# Patient Record
Sex: Male | Born: 1960 | Race: White | Hispanic: No | Marital: Single | Smoking: Never smoker
Health system: Southern US, Community
[De-identification: ages and names within clinical notes are randomized; demographics above are authoritative.]

## PROBLEM LIST (undated history)

## (undated) DIAGNOSIS — M479 Spondylosis, unspecified: Secondary | ICD-10-CM

## (undated) DIAGNOSIS — G4730 Sleep apnea, unspecified: Secondary | ICD-10-CM

## (undated) DIAGNOSIS — K439 Ventral hernia without obstruction or gangrene: Secondary | ICD-10-CM

## (undated) DIAGNOSIS — G4733 Obstructive sleep apnea (adult) (pediatric): Secondary | ICD-10-CM

## (undated) DIAGNOSIS — K429 Umbilical hernia without obstruction or gangrene: Secondary | ICD-10-CM

## (undated) DIAGNOSIS — M51369 Other intervertebral disc degeneration, lumbar region without mention of lumbar back pain or lower extremity pain: Secondary | ICD-10-CM

## (undated) DIAGNOSIS — I209 Angina pectoris, unspecified: Secondary | ICD-10-CM

## (undated) DIAGNOSIS — I700 Atherosclerosis of aorta: Secondary | ICD-10-CM

## (undated) DIAGNOSIS — M24159 Other articular cartilage disorders, unspecified hip: Secondary | ICD-10-CM

## (undated) DIAGNOSIS — I351 Nonrheumatic aortic (valve) insufficiency: Secondary | ICD-10-CM

## (undated) DIAGNOSIS — K760 Fatty (change of) liver, not elsewhere classified: Secondary | ICD-10-CM

## (undated) DIAGNOSIS — R51 Headache: Secondary | ICD-10-CM

## (undated) DIAGNOSIS — R519 Headache, unspecified: Secondary | ICD-10-CM

## (undated) DIAGNOSIS — N200 Calculus of kidney: Secondary | ICD-10-CM

## (undated) DIAGNOSIS — I779 Disorder of arteries and arterioles, unspecified: Secondary | ICD-10-CM

## (undated) DIAGNOSIS — R7302 Impaired glucose tolerance (oral): Secondary | ICD-10-CM

## (undated) DIAGNOSIS — I2510 Atherosclerotic heart disease of native coronary artery without angina pectoris: Secondary | ICD-10-CM

## (undated) DIAGNOSIS — N281 Cyst of kidney, acquired: Secondary | ICD-10-CM

## (undated) DIAGNOSIS — M5416 Radiculopathy, lumbar region: Secondary | ICD-10-CM

## (undated) DIAGNOSIS — B029 Zoster without complications: Secondary | ICD-10-CM

## (undated) DIAGNOSIS — K219 Gastro-esophageal reflux disease without esophagitis: Secondary | ICD-10-CM

## (undated) DIAGNOSIS — E669 Obesity, unspecified: Secondary | ICD-10-CM

## (undated) DIAGNOSIS — J189 Pneumonia, unspecified organism: Secondary | ICD-10-CM

## (undated) DIAGNOSIS — G44229 Chronic tension-type headache, not intractable: Secondary | ICD-10-CM

## (undated) DIAGNOSIS — M5136 Other intervertebral disc degeneration, lumbar region: Secondary | ICD-10-CM

## (undated) DIAGNOSIS — E785 Hyperlipidemia, unspecified: Secondary | ICD-10-CM

## (undated) DIAGNOSIS — K5790 Diverticulosis of intestine, part unspecified, without perforation or abscess without bleeding: Secondary | ICD-10-CM

## (undated) DIAGNOSIS — N138 Other obstructive and reflux uropathy: Secondary | ICD-10-CM

## (undated) DIAGNOSIS — K259 Gastric ulcer, unspecified as acute or chronic, without hemorrhage or perforation: Secondary | ICD-10-CM

## (undated) DIAGNOSIS — Z87442 Personal history of urinary calculi: Secondary | ICD-10-CM

## (undated) DIAGNOSIS — M169 Osteoarthritis of hip, unspecified: Secondary | ICD-10-CM

## (undated) DIAGNOSIS — I10 Essential (primary) hypertension: Secondary | ICD-10-CM

## (undated) HISTORY — PX: KNEE SURGERY: SHX244

## (undated) HISTORY — PX: NASAL SEPTOPLASTY W/ TURBINOPLASTY: SHX2070

## (undated) HISTORY — PX: TONSILLECTOMY: SUR1361

## (undated) HISTORY — PX: OTHER SURGICAL HISTORY: SHX169

## (undated) HISTORY — DX: Hyperlipidemia, unspecified: E78.5

## (undated) HISTORY — DX: Sleep apnea, unspecified: G47.30

---

## 1998-03-02 DIAGNOSIS — Z9889 Other specified postprocedural states: Secondary | ICD-10-CM

## 1998-03-02 HISTORY — DX: Other specified postprocedural states: Z98.890

## 1998-03-02 HISTORY — PX: UVULOPALATOPHARYNGOPLASTY (UPPP)/TONSILLECTOMY/SEPTOPLASTY: SHX6164

## 2006-03-02 HISTORY — PX: UPPER GI ENDOSCOPY: SHX6162

## 2008-11-16 ENCOUNTER — Ambulatory Visit: Payer: Self-pay | Admitting: Family Medicine

## 2009-09-11 ENCOUNTER — Ambulatory Visit: Payer: Self-pay | Admitting: Internal Medicine

## 2009-09-13 ENCOUNTER — Ambulatory Visit: Payer: Self-pay | Admitting: Gastroenterology

## 2009-09-19 LAB — PATHOLOGY REPORT

## 2010-05-29 ENCOUNTER — Ambulatory Visit: Payer: Self-pay | Admitting: Gastroenterology

## 2010-07-01 ENCOUNTER — Ambulatory Visit: Payer: Self-pay | Admitting: Surgery

## 2010-07-25 ENCOUNTER — Ambulatory Visit: Payer: Self-pay | Admitting: Internal Medicine

## 2010-09-29 ENCOUNTER — Ambulatory Visit: Payer: Self-pay | Admitting: Family Medicine

## 2010-12-15 ENCOUNTER — Ambulatory Visit: Payer: Self-pay

## 2011-08-04 ENCOUNTER — Emergency Department: Payer: Self-pay | Admitting: *Deleted

## 2011-09-18 ENCOUNTER — Ambulatory Visit: Payer: Self-pay | Admitting: Internal Medicine

## 2012-07-04 ENCOUNTER — Ambulatory Visit: Payer: Self-pay | Admitting: Gastroenterology

## 2012-12-18 ENCOUNTER — Ambulatory Visit: Payer: Self-pay | Admitting: Physician Assistant

## 2012-12-21 ENCOUNTER — Ambulatory Visit: Payer: Self-pay | Admitting: Physician Assistant

## 2013-07-31 DIAGNOSIS — R109 Unspecified abdominal pain: Secondary | ICD-10-CM | POA: Insufficient documentation

## 2013-08-03 DIAGNOSIS — E785 Hyperlipidemia, unspecified: Secondary | ICD-10-CM | POA: Insufficient documentation

## 2013-08-03 DIAGNOSIS — M545 Low back pain, unspecified: Secondary | ICD-10-CM | POA: Insufficient documentation

## 2013-08-03 DIAGNOSIS — K219 Gastro-esophageal reflux disease without esophagitis: Secondary | ICD-10-CM | POA: Insufficient documentation

## 2013-08-03 DIAGNOSIS — I10 Essential (primary) hypertension: Secondary | ICD-10-CM | POA: Insufficient documentation

## 2013-08-03 DIAGNOSIS — R7301 Impaired fasting glucose: Secondary | ICD-10-CM | POA: Insufficient documentation

## 2013-08-07 ENCOUNTER — Ambulatory Visit: Payer: Self-pay | Admitting: Gastroenterology

## 2013-09-07 DIAGNOSIS — M5416 Radiculopathy, lumbar region: Secondary | ICD-10-CM | POA: Insufficient documentation

## 2013-09-07 DIAGNOSIS — M51369 Other intervertebral disc degeneration, lumbar region without mention of lumbar back pain or lower extremity pain: Secondary | ICD-10-CM | POA: Insufficient documentation

## 2013-09-07 DIAGNOSIS — M5136 Other intervertebral disc degeneration, lumbar region: Secondary | ICD-10-CM | POA: Insufficient documentation

## 2013-09-07 DIAGNOSIS — M24159 Other articular cartilage disorders, unspecified hip: Secondary | ICD-10-CM | POA: Insufficient documentation

## 2013-09-07 DIAGNOSIS — M169 Osteoarthritis of hip, unspecified: Secondary | ICD-10-CM

## 2013-09-19 ENCOUNTER — Ambulatory Visit: Payer: Self-pay | Admitting: Physical Medicine and Rehabilitation

## 2013-09-20 DIAGNOSIS — M25551 Pain in right hip: Secondary | ICD-10-CM | POA: Insufficient documentation

## 2013-09-23 ENCOUNTER — Ambulatory Visit: Payer: Self-pay | Admitting: Internal Medicine

## 2013-10-17 ENCOUNTER — Ambulatory Visit: Payer: Self-pay | Admitting: Physician Assistant

## 2013-10-19 DIAGNOSIS — B029 Zoster without complications: Secondary | ICD-10-CM | POA: Insufficient documentation

## 2014-01-09 DIAGNOSIS — R7302 Impaired glucose tolerance (oral): Secondary | ICD-10-CM | POA: Insufficient documentation

## 2014-11-15 ENCOUNTER — Encounter: Payer: Self-pay | Admitting: Emergency Medicine

## 2014-11-15 ENCOUNTER — Ambulatory Visit
Admission: EM | Admit: 2014-11-15 | Discharge: 2014-11-15 | Disposition: A | Discharge status: Home / Self Care | Payer: BLUE CROSS/BLUE SHIELD | Attending: Family Medicine | Admitting: Family Medicine

## 2014-11-15 DIAGNOSIS — R197 Diarrhea, unspecified: Secondary | ICD-10-CM

## 2014-11-15 HISTORY — DX: Gastric ulcer, unspecified as acute or chronic, without hemorrhage or perforation: K25.9

## 2014-11-15 HISTORY — DX: Essential (primary) hypertension: I10

## 2014-11-15 LAB — CBC WITH DIFFERENTIAL/PLATELET
BASOS PCT: 1 %
Basophils Absolute: 0.1 10*3/uL (ref 0–0.1)
EOS ABS: 0.2 10*3/uL (ref 0–0.7)
EOS PCT: 3 %
HCT: 47.7 % (ref 40.0–52.0)
HEMOGLOBIN: 16.1 g/dL (ref 13.0–18.0)
Lymphocytes Relative: 31 %
Lymphs Abs: 2.4 10*3/uL (ref 1.0–3.6)
MCH: 29.9 pg (ref 26.0–34.0)
MCHC: 33.8 g/dL (ref 32.0–36.0)
MCV: 88.4 fL (ref 80.0–100.0)
MONOS PCT: 8 %
Monocytes Absolute: 0.6 10*3/uL (ref 0.2–1.0)
NEUTROS PCT: 57 %
Neutro Abs: 4.5 10*3/uL (ref 1.4–6.5)
PLATELETS: 227 10*3/uL (ref 150–440)
RBC: 5.4 MIL/uL (ref 4.40–5.90)
RDW: 13.8 % (ref 11.5–14.5)
WBC: 7.9 10*3/uL (ref 3.8–10.6)

## 2014-11-15 LAB — URINALYSIS COMPLETE WITH MICROSCOPIC (ARMC ONLY)
Bacteria, UA: NONE SEEN — AB
Bilirubin Urine: NEGATIVE
Glucose, UA: NEGATIVE mg/dL
Hgb urine dipstick: NEGATIVE
KETONES UR: NEGATIVE mg/dL
Leukocytes, UA: NEGATIVE
Nitrite: NEGATIVE
PROTEIN: NEGATIVE mg/dL
SQUAMOUS EPITHELIAL / LPF: NONE SEEN — AB
Specific Gravity, Urine: 1.015 (ref 1.005–1.030)
WBC, UA: NONE SEEN WBC/hpf (ref <3)
pH: 5.5 (ref 5.0–8.0)

## 2014-11-15 LAB — BASIC METABOLIC PANEL
Anion gap: 9 (ref 5–15)
BUN: 15 mg/dL (ref 6–20)
CO2: 23 mmol/L (ref 22–32)
CREATININE: 1.03 mg/dL (ref 0.61–1.24)
Calcium: 9.3 mg/dL (ref 8.9–10.3)
Chloride: 105 mmol/L (ref 101–111)
GFR calc non Af Amer: >60 mL/min (ref >60)
Glucose, Bld: 98 mg/dL (ref 65–99)
Potassium: 4.5 mmol/L (ref 3.5–5.1)
SODIUM: 137 mmol/L (ref 135–145)

## 2014-11-15 LAB — OCCULT BLOOD X 1 CARD TO LAB, STOOL: FECAL OCCULT BLD: NEGATIVE

## 2014-11-15 MED ORDER — DICYCLOMINE HCL 20 MG PO TABS: 20.0000 mg | ORAL_TABLET | Freq: Two times a day (BID) | ORAL | Status: DC

## 2014-11-15 MED ORDER — CIPROFLOXACIN HCL 500 MG PO TABS: 500.0000 mg | ORAL_TABLET | Freq: Two times a day (BID) | ORAL | Status: AC

## 2014-11-15 NOTE — Discharge Instructions (Signed)

## 2014-11-15 NOTE — ED Notes (Signed)
Patient c/o diarrhea and nausea for the past 3 days.

## 2014-11-15 NOTE — ED Notes (Signed)
Stool for O&P, Stool for culture sent to lab

## 2014-11-15 NOTE — ED Provider Notes (Signed)
CSN: [id number]     Arrival date & time 11/15/14  1253 History   First MD Initiated Contact with Patient 11/15/14 1436     Chief Complaint  Patient presents with  . Diarrhea   (Consider location/radiation/quality/duration/timing/severity/associated sxs/prior Treatment) HPI   54 yo M spent the weekend with his brother in [facility]. DId not swim in the waters. Did eat in restaurants and at home. No one was sick during visit. He returned home and developed diarrhea 2 days later. 6 episodes per day and night x 3 days. Very foul smelling. No fever. Feels "weak" but not terribly sick. Thirsty. Has been eating. No vomiting.  Started Pepto Bismol after 24 hours and noted no change except stools looked black after Rx started.  Has had previous abdominal surgery after "stomach perforation" 2008- ulcer s/p knee surgery ??NSAIDs Past Medical History  Diagnosis Date  . Hypertension   . Stomach ulcer    Past Surgical History  Procedure Laterality Date  . Knee surgery Left   Extensive abdominal surgery after "perforated stomach" some months following his surgery for left knee 2008 History reviewed. No pertinent family history. Social History  Substance Use Topics  . Smoking status: Never Smoker   . Smokeless tobacco: None  . Alcohol Use: Yes    Review of Systems   Constitutional -afebrile Eyes-denies visual changes ENT- normal voice,denies sore throat CV-denies chest pain Resp-denies SOB GI- negative for nausea,vomiting, positive diarrhea x 3 days GU- negative for dysuria MSK- negative for back pain, ambulatory Skin- denies acute changes Neuro- negative headache,focal weakness or numbness Longstanding ventral hernia post op exploratory surgery   Allergies  Review of patient's allergies indicates no known allergies.  Home Medications   Prior to Admission medications   Medication Sig Start Date End Date Taking? Authorizing Provider  amLODipine (NORVASC) 5 MG  tablet Take 5 mg by mouth daily.   Yes Historical Provider, MD  pravastatin (PRAVACHOL) 10 MG tablet Take 10 mg by mouth daily.   Yes Historical Provider, MD  ciprofloxacin (CIPRO) 500 MG tablet Take 1 tablet (500 mg total) by mouth 2 (two) times daily. 11/15/14 11/18/14  Rae Halsted, PA-C  dicyclomine (BENTYL) 20 MG tablet Take 1 tablet (20 mg total) by mouth 2 (two) times daily. 11/15/14   Rae Halsted, PA-C   Meds Ordered and Administered this Visit  Medications - No data to display  BP 123/78 mmHg  Pulse 79  Temp(Src) 97.7 F (36.5 C) (Tympanic)  Resp 16  Ht 5\' 10"  (1.778 m)  Wt 257 lb (116.574 kg)  BMI 36.88 kg/m2  SpO2 98% No data found.   Physical Exam  Constitutional: Alert and oriented, well appearing, VS are noted, stable, afebrile, p 79 General : no acute distress;relaxed appearing HEENT:  Head:normocephalic, atraumatic,                Eyes: conjugate gaze,negative conjunctiva, wears glasses     Ears:Bilateral canals and tympanic membranes WNL     Nose:normal,     Mouth/throat :Mucous membranes moist, No pharyngeal erythema,no exudate, no noted oral lesions Neck :  supple without thyromegaly Lymph: without enlargement Lung:   effort and breath sounds normal , no distress Heart:   normal rate,regular rhythm,  Back:    No CVAT, no spinal tenderness noted Abd :    soft, nontender, hyperactive bowel sounds present, no guarding or rebound,no organomegaly appreciated, ventral hernia, reducible- extensive scarring  MSK:   nontender, normal ROM all  extremities;normal flexion; ambulatory, on and off table without assistance Neuro: CN ll-Xl as tested,grossly intact; normal gait, normal speech and language Skin:  Warm,dry,intact Psych: mood and affect WNL ED Course  Procedures (including critical care time)  Labs Review Labs Reviewed  URINALYSIS COMPLETEWITH MICROSCOPIC ([facility] ONLY) - Abnormal; Notable for the following:    Bacteria, UA NONE SEEN (*)    Squamous Epithelial  / LPF NONE SEEN (*)    All other components within normal limits  STOOL CULTURE  OVA AND PARASITE EXAMINATION  BASIC METABOLIC PANEL  CBC WITH DIFFERENTIAL/PLATELET  OCCULT BLOOD X 1 CARD TO LAB, STOOL  OVA + PARASITE EXAM    BMP and CBC stable normal- occult blood negative- urine no ketones ,no infection  Imaging Review No results found.  Patient appeared well and experienced no episodes of diarrhea while with [place]. His samples for lab were minimal and Cdiff could not  be run. He denies any antibiotics in many months. He was taking PO clear liquids without distress. Felt to be non-toxic and stable for discharge. Traveler's diarrhea suspected- cover with Cipro 500 BID x 3 days pending cultures  and Rx GI cramps    MDM   1. Diarrhea    Plan: 1. Test results and diagnosis reviewed with patient 2. rx as per orders; risks, benefits, potential side effects reviewed with patient 3. Recommend supportive treatment with clear liquids, saltines, BRAT, sports drinks without sugar or split half/with/without. 4. Seek additional medical care if symptoms worsen or don't improve- call for lab results  Discharge Medication List as of 11/15/2014  4:26 PM    START taking these medications   Details  ciprofloxacin (CIPRO) 500 MG tablet Take 1 tablet (500 mg total) by mouth 2 (two) times daily., Starting 11/15/2014, Until Sun 11/18/14, Print    dicyclomine (BENTYL) 20 MG tablet Take 1 tablet (20 mg total) by mouth 2 (two) times daily., Starting 11/15/2014, Until Discontinued, Print         Rae Halsted, PA-C 11/16/14 2035

## 2014-11-16 ENCOUNTER — Encounter: Payer: Self-pay | Admitting: Physician Assistant

## 2014-11-17 LAB — STOOL CULTURE

## 2014-11-20 LAB — O&P RESULT

## 2014-11-20 LAB — OVA + PARASITE EXAM

## 2016-03-02 DIAGNOSIS — N200 Calculus of kidney: Secondary | ICD-10-CM

## 2016-03-02 HISTORY — DX: Calculus of kidney: N20.0

## 2016-03-13 ENCOUNTER — Ambulatory Visit: Payer: Self-pay | Admitting: Urology

## 2016-04-02 ENCOUNTER — Other Ambulatory Visit: Payer: Self-pay

## 2016-04-02 DIAGNOSIS — N3941 Urge incontinence: Secondary | ICD-10-CM

## 2016-04-03 ENCOUNTER — Ambulatory Visit: Payer: Self-pay | Admitting: Urology

## 2016-04-03 ENCOUNTER — Encounter: Payer: Self-pay | Admitting: Urology

## 2016-04-03 NOTE — Progress Notes (Deleted)
04/03/2016 12:05 PM   Joe Dunlap [facility] 1960-09-01 [id number]  Referring provider: Marina Goodell, MD [address] [facility] [place], [place] [address]  No chief complaint on file.   HPI: 56 year old male who presents today for further evaluation of urinary frequency.  He was given oxybutynin by his primary care physician which did not seem to help with his urinary frequency after several months.  Multiple previous urinalysis of her past year negative for microscopic bladder infection.  Most recent PSA 1.1 on 3/17.    Cr 1.0 on 01/2016.  He does also have baseline erectile dysfunction. This is managed with Viagra which works well.  Past medical history is significant for hypertension, hyperlipidemia, GERD, appeared glucose tolerance (A1c 5.7), erectile dysfunction.   PMH: Past Medical History:  Diagnosis Date  . Hypertension   . Stomach ulcer     Surgical History: Past Surgical History:  Procedure Laterality Date  . KNEE SURGERY Left     Home Medications:  Allergies as of 04/03/2016   No Known Allergies     Medication List       Accurate as of 04/03/16 12:05 PM. Always use your most recent med list.          amLODipine 5 MG tablet Commonly known as:  NORVASC Take 5 mg by mouth daily.   dicyclomine 20 MG tablet Commonly known as:  BENTYL Take 1 tablet (20 mg total) by mouth 2 (two) times daily.   pravastatin 10 MG tablet Commonly known as:  PRAVACHOL Take 10 mg by mouth daily.       Allergies: No Known Allergies  Family History: No family history on file.  Social History:  reports that he has never smoked. He does not have any smokeless tobacco history on file. He reports that he drinks alcohol. His drug history is not on file.  ROS:                                        Physical Exam: There were no vitals taken for this visit.  Constitutional:  Alert and oriented, No acute  distress. HEENT: [place] AT, moist mucus membranes.  Trachea midline, no masses. Cardiovascular: No clubbing, cyanosis, or edema. Respiratory: Normal respiratory effort, no increased work of breathing. GI: Abdomen is soft, nontender, nondistended, no abdominal masses GU: No CVA tenderness. *** Skin: No rashes, bruises or suspicious lesions. Lymph: No cervical or inguinal adenopathy. Neurologic: Grossly intact, no focal deficits, moving all 4 extremities. Psychiatric: Normal mood and affect.  Laboratory Data: Labs as above  Urinalysis Urinalysis w/Microscopic (02/13/2016 11:05 AM) Urinalysis w/Microscopic (02/13/2016 11:05 AM)  Component Value Ref Range  Color Yellow Yellow, Straw, Blue  Clarity Clear Clear, Slightly Cloudy, Turbid Other  Specific Gravity 1.020 1.000 - 1.030  pH, Urine 7.0 5.0 - 8.0  Protein, Urinalysis Negative Negative, Trace mg/dL  Glucose, Urinalysis Negative Negative mg/dL  Ketones, Urinalysis Negative Negative mg/dL  Blood, Urinalysis Negative Negative  Nitrite, Urinalysis Negative Negative  Leukocyte Esterase, Urinalysis Negative Negative  White Blood Cells, Urinalysis None Seen None Seen, 0-3 /hpf  Red Blood Cells, Urinalysis 0-3 None Seen, 0-3 /hpf  Bacteria, Urinalysis None Seen None Seen /hpf  Squamous Epithelial Cells, Urinalysis Rare Rare, Few, None Seen /hpf    Pertinent Imaging:   Assessment & Plan:  ***  There are no diagnoses linked to this  encounter.  No Follow-up on file.  Vanna ScotlandAshley Linetta Regner, MD  [facility] [address], [address] [place], [place] [address] [phone]

## 2016-04-06 ENCOUNTER — Other Ambulatory Visit: Admission: RE | Admit: 2016-04-06 | Payer: BLUE CROSS/BLUE SHIELD | Source: Ambulatory Visit | Admitting: Urology

## 2016-05-01 ENCOUNTER — Ambulatory Visit
Admission: EM | Admit: 2016-05-01 | Discharge: 2016-05-01 | Disposition: A | Discharge status: Home / Self Care | Payer: BLUE CROSS/BLUE SHIELD | Attending: Family Medicine | Admitting: Family Medicine

## 2016-05-01 ENCOUNTER — Encounter: Payer: Self-pay | Admitting: *Deleted

## 2016-05-01 DIAGNOSIS — R42 Dizziness and giddiness: Secondary | ICD-10-CM

## 2016-05-01 MED ORDER — MECLIZINE HCL 25 MG PO TABS: 25.0000 mg | ORAL_TABLET | Freq: Three times a day (TID) | ORAL | 0 refills | Status: DC | PRN

## 2016-05-01 NOTE — ED Triage Notes (Signed)
Patient reports symptoms of headache with dizziness started 1 hour before admission today. No previous history of dizziness with headache.

## 2016-05-01 NOTE — ED Triage Notes (Signed)
Patient does report having problems with vertigo in the past. Dizziness does increase with head movement.

## 2016-05-01 NOTE — ED Provider Notes (Signed)
CSN: [id number]     Arrival date & time 05/01/16  [id number] History   First MD Initiated Contact with Patient 05/01/16 1035     Chief Complaint  Patient presents with  . Dizziness   (Consider location/radiation/quality/duration/timing/severity/associated sxs/prior Treatment) HPI  56 year old male presents with is of a frontal headache and dizziness that started approximately 2 hours ago while he was at work. This was a sudden onset. Mild nausea but no vomiting. He's had no unsteadiness of gait does not have any near-syncope or syncope. He has a history of previous dizziness about 10 years ago that has not been a problem or had any sequelae. He does state that whenever he looks up his dizziness is worse. He does not specifically characterize the dizziness as the room moving her him moving. Lightheadedness feeling. He said no visual or auditory disturbances.       Past Medical History:  Diagnosis Date  . Hypertension   . Stomach ulcer    Past Surgical History:  Procedure Laterality Date  . KNEE SURGERY Left    Family History  Problem Relation Age of Onset  . Heart attack Father    Social History  Substance Use Topics  . Smoking status: Never Smoker  . Smokeless tobacco: Never Used  . Alcohol use Yes    Review of Systems  Constitutional: Positive for activity change. Negative for chills, fatigue and fever.  Eyes: Negative for photophobia and visual disturbance.  Respiratory: Negative for cough.   Neurological: Positive for dizziness. Negative for syncope, weakness and numbness.  All other systems reviewed and are negative.   Allergies  Patient has no known allergies.  Home Medications   Prior to Admission medications   Medication Sig Start Date End Date Taking? Authorizing Provider  amLODipine (NORVASC) 5 MG tablet Take 5 mg by mouth daily.   Yes Historical Provider, MD  dicyclomine (BENTYL) 20 MG tablet Take 1 tablet (20 mg total) by mouth 2 (two) times daily. 11/15/14  Yes  Rae HalstedLaurie W Lee, PA-C  pravastatin (PRAVACHOL) 10 MG tablet Take 40 mg by mouth daily.    Yes Historical Provider, MD  meclizine (ANTIVERT) 25 MG tablet Take 1 tablet (25 mg total) by mouth 3 (three) times daily as needed for dizziness. 05/01/16   Lutricia FeilWilliam P Elin Seats, PA-C   Meds Ordered and Administered this Visit  Medications - No data to display  BP 140/72 (BP Location: Left Arm)   Pulse 69   Temp 98 F (36.7 C) (Oral)   Resp 16   Ht 5\' 10"  (1.778 m)   Wt 260 lb (117.9 kg)   SpO2 98%   BMI 37.31 kg/m  Orthostatic VS for the past 24 hrs:  BP- Lying Pulse- Lying BP- Sitting Pulse- Sitting BP- Standing at 0 minutes Pulse- Standing at 0 minutes  05/01/16 1059 137/67 65 140/76 67 137/73 72    Physical Exam  Constitutional: He is oriented to person, place, and time. He appears well-developed and well-nourished. No distress.  HENT:  Head: Normocephalic and atraumatic.  Right Ear: External ear normal.  Left Ear: External ear normal.  Mouth/Throat: Oropharynx is clear and moist.  Eyes: Right eye exhibits no discharge. Left eye exhibits no discharge.  Examination of the eyes shows a right lateral nystagmus present. Is reproducible with multiple beats. He has good cervical range of motion without discomfort.  Neck: Normal range of motion. Neck supple.  Pulmonary/Chest: No respiratory distress. He has no wheezes. He has no rales.  Musculoskeletal:  Normal range of motion.  Lymphadenopathy:    He has no cervical adenopathy.  Neurological: He is alert and oriented to person, place, and time. He displays normal reflexes. No cranial nerve deficit or sensory deficit. He exhibits normal muscle tone. Coordination normal.  Skin: Skin is warm and dry. He is not diaphoretic.  Psychiatric: He has a normal mood and affect. His behavior is normal. Judgment and thought content normal.  Nursing note and vitals reviewed.   Urgent Care Course     Procedures (including critical care time)  Labs  Review Labs Reviewed - No data to display  Imaging Review No results found.   Visual Acuity Review  Right Eye Distance:   Left Eye Distance:   Bilateral Distance:    Right Eye Near:   Left Eye Near:    Bilateral Near:         MDM   1. Dizziness    Discharge Medication List as of 05/01/2016 11:06 AM    START taking these medications   Details  meclizine (ANTIVERT) 25 MG tablet Take 1 tablet (25 mg total) by mouth 3 (three) times daily as needed for dizziness., Starting Fri 05/01/2016, Normal      Plan: 1. Test/x-ray results and diagnosis reviewed with patient 2. rx as per orders; risks, benefits, potential side effects reviewed with patient 3. Recommend supportive treatment with Increased fluids. Caution regarding the use of the medication with activities are required judgment and concentration. He should not drive when taking this medication. He is not improving he should follow-up with his primary care physician next week. If he worsens he should go the emergency room for further evaluation 4. F/u prn if symptoms worsen or don't improve     Lutricia Feil, PA-C 05/01/16 1120

## 2016-05-07 ENCOUNTER — Other Ambulatory Visit: Payer: Self-pay

## 2016-05-07 DIAGNOSIS — N3941 Urge incontinence: Secondary | ICD-10-CM

## 2016-05-08 ENCOUNTER — Ambulatory Visit (INDEPENDENT_AMBULATORY_CARE_PROVIDER_SITE_OTHER): Payer: BLUE CROSS/BLUE SHIELD | Admitting: Urology

## 2016-05-08 ENCOUNTER — Encounter: Payer: Self-pay | Admitting: Urology

## 2016-05-08 ENCOUNTER — Other Ambulatory Visit
Admission: RE | Admit: 2016-05-08 | Discharge: 2016-05-08 | Disposition: A | Discharge status: Home / Self Care | Payer: BLUE CROSS/BLUE SHIELD | Source: Ambulatory Visit | Attending: Urology | Admitting: Urology

## 2016-05-08 VITALS — BP 127/67 | HR 79 | Ht 70.0 in | Wt 260.0 lb

## 2016-05-08 DIAGNOSIS — N529 Male erectile dysfunction, unspecified: Secondary | ICD-10-CM | POA: Diagnosis not present

## 2016-05-08 DIAGNOSIS — N3941 Urge incontinence: Secondary | ICD-10-CM | POA: Diagnosis not present

## 2016-05-08 DIAGNOSIS — R350 Frequency of micturition: Secondary | ICD-10-CM

## 2016-05-08 DIAGNOSIS — R3129 Other microscopic hematuria: Secondary | ICD-10-CM

## 2016-05-08 DIAGNOSIS — Z87442 Personal history of urinary calculi: Secondary | ICD-10-CM

## 2016-05-08 LAB — URINALYSIS, COMPLETE (UACMP) WITH MICROSCOPIC
BACTERIA UA: NONE SEEN
Bilirubin Urine: NEGATIVE
GLUCOSE, UA: NEGATIVE mg/dL
Ketones, ur: NEGATIVE mg/dL
Leukocytes, UA: NEGATIVE
Nitrite: NEGATIVE
Protein, ur: NEGATIVE mg/dL
SPECIFIC GRAVITY, URINE: 1.025 (ref 1.005–1.030)
SQUAMOUS EPITHELIAL / LPF: NONE SEEN
pH: 6 (ref 5.0–8.0)

## 2016-05-08 LAB — BLADDER SCAN AMB NON-IMAGING

## 2016-05-08 MED ORDER — FINASTERIDE 5 MG PO TABS: 5.0000 mg | ORAL_TABLET | Freq: Every day | ORAL | 11 refills | Status: DC

## 2016-05-08 NOTE — Progress Notes (Signed)
05/08/2016 12:19 PM   Joe Dunlap 1960/04/17 [id number]  Referring provider: Sofie Hartigan, MD [place], [place] [address]  Chief Complaint  Patient presents with  . Urinary Frequency    New Patient    HPI: 56 year old male who presents today for further evaluation of urinary frequency.  He reports that he has had urinary frequency for several years. He primarily reports daytime frequency at least every 2 hours or more. He has associated urgency but no urge incontinence.  He gets up only once at night to urinate.   He has tried Flomax for approximately one month prescribed by his PCP without any difference in his symptoms. He's also tried oxybutynin which also made no appreciable improvement in his urinary symptoms.  No history of urinary tract infections were gross hematuria. No flank pain.  He drinks primarily water. He does drink at least once a day and is previously tried cutting this out without improvement of the symptoms.  He does have a history of bilateral nephrolithiasis. He underwent shockwave lithotripsy 1 in the remote past but none recently. His most recent episode was approximately 4 months ago passing a very small stone spontaneously. No recent imaging.  Most recent PSA on 05/30/2015 was 1.11.  PVR 52 cc.    He does also have a history of erectile dysfunction. He was present prescribed 50 mg tablet of Viagra by his primary care physician.  He has multiple medical comorbidities including borderline diabetes, sleep apnea compliant with CPAP, hypertension, hyperlipidemia.       IPSS    Row Name 05/08/16 1600         International Prostate Symptom Score   How often have you had the sensation of not emptying your bladder? Not at All     How often have you had to urinate less than every two hours? About half the time     How often have you found you stopped and started again several times when you  urinated? Not at All     How often have you found it difficult to postpone urination? Less than half the time     How often have you had a weak urinary stream? Not at All     How often have you had to strain to start urination? Not at All     How many times did you typically get up at night to urinate? 1 Time     Total IPSS Score 6       Quality of Life due to urinary symptoms   If you were to spend the rest of your life with your urinary condition just the way it is now how would you feel about that? Unhappy        PMH: Past Medical History:  Diagnosis Date  . Hyperlipemia   . Hypertension   . Sleep apnea   . Stomach ulcer     Surgical History: Past Surgical History:  Procedure Laterality Date  . KNEE SURGERY Left   . UPPER GI ENDOSCOPY  2008   stomach ulcer  . UVULOPALATOPHARYNGOPLASTY (UPPP)/TONSILLECTOMY/SEPTOPLASTY  2000    Home Medications:  Allergies as of 05/08/2016   No Known Allergies     Medication List       Accurate as of 05/08/16 11:59 PM. Always use your most recent med list.          amLODipine 5 MG tablet Commonly known as:  NORVASC  Take 5 mg by mouth daily.   dicyclomine 20 MG tablet Commonly known as:  BENTYL Take 1 tablet (20 mg total) by mouth 2 (two) times daily.   finasteride 5 MG tablet Commonly known as:  PROSCAR Take 1 tablet (5 mg total) by mouth daily.   meclizine 25 MG tablet Commonly known as:  ANTIVERT Take 1 tablet (25 mg total) by mouth 3 (three) times daily as needed for dizziness.   pravastatin 10 MG tablet Commonly known as:  PRAVACHOL Take 40 mg by mouth daily.       Allergies: No Known Allergies  Family History: Family History  Problem Relation Age of Onset  . Heart attack Father   . Prostatitis Neg Hx   . Prostate cancer Neg Hx   . Kidney cancer Neg Hx     Social History:  reports that he has never smoked. He has never used smokeless tobacco. He reports that he drinks alcohol. He reports that he does not  use drugs.  ROS: UROLOGY Frequent Urination?: Yes Hard to postpone urination?: Yes Burning/pain with urination?: No Get up at night to urinate?: Yes Leakage of urine?: Yes Urine stream starts and stops?: No Trouble starting stream?: No Do you have to strain to urinate?: No Blood in urine?: No Urinary tract infection?: No Sexually transmitted disease?: No Injury to kidneys or bladder?: No Painful intercourse?: No Weak stream?: No Erection problems?: Yes Penile pain?: No  Gastrointestinal Nausea?: No Vomiting?: No Indigestion/heartburn?: No Diarrhea?: No Constipation?: No  Constitutional Fever: No Night sweats?: No Weight loss?: No Fatigue?: No  Skin Skin rash/lesions?: No Itching?: No  Eyes Blurred vision?: No Double vision?: No  Ears/Nose/Throat Sore throat?: No Sinus problems?: No  Hematologic/Lymphatic Swollen glands?: No Easy bruising?: No  Cardiovascular Leg swelling?: No Chest pain?: No  Respiratory Cough?: No Shortness of breath?: No  Endocrine Excessive thirst?: No  Musculoskeletal Back pain?: No Joint pain?: No  Neurological Headaches?: No Dizziness?: No  Psychologic Depression?: No Anxiety?: No  Physical Exam: BP 127/67   Pulse 79   Ht 5' 10"  (1.778 m)   Wt 260 lb (117.9 kg)   BMI 37.31 kg/m   Constitutional:  Alert and oriented, No acute distress. HEENT: [place] AT, moist mucus membranes.  Trachea midline, no masses. Cardiovascular: No clubbing, cyanosis, or edema. Respiratory: Normal respiratory effort, no increased work of breathing. GI: Abdomen is soft, nontender, nondistended, no abdominal masses.  Obese. GU: No CVA tenderness.  Circumcised phallus with orthotopic meatus. Bilateral descended testicles with no masses, nontender. Rectal:  normal shincter tone. 40+ cc prostate, nontender, no nodules. Skin: No rashes, bruises or suspicious lesions. Neurologic: Grossly intact, no focal deficits, moving all 4  extremities. Psychiatric: Normal mood and affect.  Laboratory Data: Hemoglobin A1C (02/13/2016 11:05 AM) Hemoglobin A1C (02/13/2016 11:05 AM)  Component Value Ref Range  Hemoglobin A1C 5.7 (H) 4.2 - 5.6 %  Average Blood Glucose (Calc) 117 mg/dL   Comprehensive Metabolic Panel (CMP) (00/92/3300 11:05 AM) Comprehensive Metabolic Panel (CMP) (76/22/6333 11:05 AM)  Component Value Ref Range  Glucose 105 70 - 110 mg/dL  Sodium 141 136 - 145 mmol/L  Potassium 4.4 3.6 - 5.1 mmol/L  Chloride 104 97 - 109 mmol/L  Carbon Dioxide (CO2) 28.0 22.0 - 32.0 mmol/L  Urea Nitrogen (BUN) 20 7 - 25 mg/dL  Creatinine 1.0 0.7 - 1.3 mg/dL  Glomerular Filtration Rate (eGFR), MDRD Estimate 78 >60 mL/min/1.73sq m  Calcium 9.4 8.7 - 10.3 mg/dL     Urinalysis  Component     Latest Ref Rng & Units 05/08/2016  Color, Urine     YELLOW YELLOW  Appearance     CLEAR CLEAR  Glucose     NEGATIVE mg/dL NEGATIVE  Bilirubin Urine     NEGATIVE NEGATIVE  Ketones, ur     NEGATIVE mg/dL NEGATIVE  Specific Gravity, Urine     1.005 - 1.030 1.025  Hgb urine dipstick     NEGATIVE MODERATE (A)  pH     5.0 - 8.0 6.0  Protein     NEGATIVE mg/dL NEGATIVE  Nitrite     NEGATIVE NEGATIVE  Leukocytes, UA     NEGATIVE NEGATIVE  RBC / HPF     0 - 5 RBC/hpf 6-30  WBC, UA     0 - 5 WBC/hpf 0-5  Bacteria, UA     NONE SEEN NONE SEEN  Squamous Epithelial / LPF     NONE SEEN NONE SEEN   Pertinent Imaging: n/a  Assessment & Plan:    1. Urinary frequency Behavioral modification discussed Fairly enlarged gland today on rectal exam, may benefit from finasteride Side effects reviewed We'll reassess in a few months, Saturday addition of alternative anticholinergic versus beta 3 agonist if not effective - BLADDER SCAN AMB NON-IMAGING  2. Microscopic hematuria Incidental microscopic hematuria today. In the setting of urinary frequency, I have recommended proceeding with microscopic hematuria workup including CT  urogram and cystoscopy. Differential diagnosis was discussed with the patient's. We discussed his post likely etiology is kindey stones, but is important to rule out other pathology.  He is agreeable with this plan. - CT HEMATURIA WORKUP; Future  3. Erectile dysfunction, unspecified erectile dysfunction type Doing well on Viagra  4. History of nephrolithiasis We will assess overall stone burden with CT urogram as above  Return in about 1 month (around 06/08/2016) for f/u CT urogram, cystoscopy, recheck voiding.  Hollice Espy, MD  [facility] [address], [place], [place] [address] [phone]

## 2016-05-19 ENCOUNTER — Ambulatory Visit
Admission: RE | Admit: 2016-05-19 | Discharge: 2016-05-19 | Disposition: A | Discharge status: Home / Self Care | Payer: BLUE CROSS/BLUE SHIELD | Source: Ambulatory Visit | Attending: Urology | Admitting: Urology

## 2016-05-19 DIAGNOSIS — K439 Ventral hernia without obstruction or gangrene: Secondary | ICD-10-CM | POA: Insufficient documentation

## 2016-05-19 DIAGNOSIS — I700 Atherosclerosis of aorta: Secondary | ICD-10-CM | POA: Insufficient documentation

## 2016-05-19 DIAGNOSIS — R3129 Other microscopic hematuria: Secondary | ICD-10-CM

## 2016-05-19 DIAGNOSIS — N202 Calculus of kidney with calculus of ureter: Secondary | ICD-10-CM | POA: Diagnosis not present

## 2016-05-19 DIAGNOSIS — K5730 Diverticulosis of large intestine without perforation or abscess without bleeding: Secondary | ICD-10-CM | POA: Diagnosis not present

## 2016-05-19 MED ORDER — IOPAMIDOL (ISOVUE-300) INJECTION 61%
150.0000 mL | Freq: Once | INTRAVENOUS | Status: AC | PRN
  Administered 2016-05-19: 125 mL via INTRAVENOUS

## 2016-05-20 ENCOUNTER — Telehealth: Payer: Self-pay | Admitting: Urology

## 2016-05-20 DIAGNOSIS — N201 Calculus of ureter: Secondary | ICD-10-CM

## 2016-05-20 MED ORDER — TAMSULOSIN HCL 0.4 MG PO CAPS: 0.4000 mg | ORAL_CAPSULE | Freq: Every day | ORAL | 0 refills | Status: DC

## 2016-05-20 NOTE — Telephone Encounter (Signed)
CT scan findings of a 6 mm right proximal ureteral stone was reviewed with the patient. He is currently completely asymptomatic.  I have recommended medical expulsive therapy. He'll pick up a urinary strainer on Friday. Flomax to be prescribed.    I would like him to get a KUB prior to her follow-up visit. If the stone is still present, will recommend intervention.  Vanna ScotlandAshley Hanif Radin, MD

## 2016-06-18 ENCOUNTER — Telehealth: Payer: Self-pay | Admitting: Urology

## 2016-06-18 ENCOUNTER — Other Ambulatory Visit: Payer: Self-pay

## 2016-06-18 ENCOUNTER — Other Ambulatory Visit: Payer: Self-pay | Admitting: Urology

## 2016-06-18 DIAGNOSIS — R3129 Other microscopic hematuria: Secondary | ICD-10-CM

## 2016-06-18 NOTE — Telephone Encounter (Signed)
No, please have him come in with KUB.  The stone has been there for ~4 weeks and we need to talk about intervention. It is extremely important that he follow up in a timely manner for this.  [name], MD

## 2016-06-18 NOTE — Telephone Encounter (Signed)
No vm set up. 

## 2016-06-18 NOTE — Telephone Encounter (Signed)
Pt cancelled appt in [place] tomorrow at 4, rescheduled for 5/18.  He said he hasn't passed his stones yet.  Please advise.

## 2016-06-19 ENCOUNTER — Other Ambulatory Visit
Admission: RE | Admit: 2016-06-19 | Discharge: 2016-06-19 | Disposition: A | Discharge status: Home / Self Care | Payer: BLUE CROSS/BLUE SHIELD | Source: Ambulatory Visit | Attending: Urology | Admitting: Urology

## 2016-06-19 ENCOUNTER — Ambulatory Visit: Payer: Self-pay | Admitting: Urology

## 2016-06-19 ENCOUNTER — Ambulatory Visit
Admission: RE | Admit: 2016-06-19 | Discharge: 2016-06-19 | Disposition: A | Discharge status: Home / Self Care | Payer: BLUE CROSS/BLUE SHIELD | Source: Ambulatory Visit | Attending: Urology | Admitting: Urology

## 2016-06-19 ENCOUNTER — Encounter: Payer: Self-pay | Admitting: Urology

## 2016-06-19 ENCOUNTER — Ambulatory Visit (INDEPENDENT_AMBULATORY_CARE_PROVIDER_SITE_OTHER): Payer: BLUE CROSS/BLUE SHIELD | Admitting: Urology

## 2016-06-19 VITALS — BP 135/73 | HR 100 | Ht 70.0 in | Wt 263.0 lb

## 2016-06-19 DIAGNOSIS — R3129 Other microscopic hematuria: Secondary | ICD-10-CM | POA: Diagnosis not present

## 2016-06-19 DIAGNOSIS — N201 Calculus of ureter: Secondary | ICD-10-CM

## 2016-06-19 DIAGNOSIS — N200 Calculus of kidney: Secondary | ICD-10-CM | POA: Diagnosis not present

## 2016-06-19 DIAGNOSIS — Z87442 Personal history of urinary calculi: Secondary | ICD-10-CM | POA: Diagnosis not present

## 2016-06-19 DIAGNOSIS — R350 Frequency of micturition: Secondary | ICD-10-CM

## 2016-06-19 LAB — URINALYSIS, COMPLETE (UACMP) WITH MICROSCOPIC
BILIRUBIN URINE: NEGATIVE
Bacteria, UA: NONE SEEN
GLUCOSE, UA: NEGATIVE mg/dL
LEUKOCYTES UA: NEGATIVE
Nitrite: NEGATIVE
PROTEIN: NEGATIVE mg/dL
SQUAMOUS EPITHELIAL / LPF: NONE SEEN
Specific Gravity, Urine: 1.025 (ref 1.005–1.030)
pH: 5.5 (ref 5.0–8.0)

## 2016-06-19 NOTE — Progress Notes (Signed)
06/19/2016 4:43 PM   Joe Dunlap [place] 02/11/1961 [id number]  Referring provider: Marina Goodell, MD [address] [place], [place] [address]  Chief Complaint  Patient presents with  . Results    CT    HPI: 56 year old male who initially presented with multiple GU issues including urinary frequency, BPH with lots, microsocopic hematuria, and history of kidney stones returns today for routine follow-up.  He underwent CT urogram on 05/19/16 for further workup of microscopic hematuria to have an incidental 6 mm proximal nonobstructing calculus. Since the time of the study, he's been straining his urine routinely and has not seen the stone pass.  He occasionally has some nonspecific flank pain but no severe pain associated with this stone. He does have a personal history stones and is required to undergo ESWL in the past.    He is currently on Flomax and he was started on finasteride at last visit one month ago in addition to this for [place] urinary symptoms. He seen some mild but not dramatic improvement in his urinary flow.   PMH: Past Medical History:  Diagnosis Date  . Hyperlipemia   . Hypertension   . Sleep apnea   . Stomach ulcer     Surgical History: Past Surgical History:  Procedure Laterality Date  . KNEE SURGERY Left   . UPPER GI ENDOSCOPY  2008   stomach ulcer  . UVULOPALATOPHARYNGOPLASTY (UPPP)/TONSILLECTOMY/SEPTOPLASTY  2000    Home Medications:  Allergies as of 06/19/2016   No Known Allergies     Medication List       Accurate as of 06/19/16  4:43 PM. Always use your most recent med list.          amLODipine 5 MG tablet Commonly known as:  NORVASC Take 5 mg by mouth daily.   dicyclomine 20 MG tablet Commonly known as:  BENTYL Take 1 tablet (20 mg total) by mouth 2 (two) times daily.   finasteride 5 MG tablet Commonly known as:  PROSCAR Take 1 tablet (5 mg total) by mouth daily.   meclizine 25 MG tablet Commonly known as:  ANTIVERT Take 1  tablet (25 mg total) by mouth 3 (three) times daily as needed for dizziness.   pravastatin 10 MG tablet Commonly known as:  PRAVACHOL Take 40 mg by mouth daily.   tamsulosin 0.4 MG Caps capsule Commonly known as:  FLOMAX TAKE 1 CAPSULE(0.4 MG) BY MOUTH DAILY       Allergies: No Known Allergies  Family History: Family History  Problem Relation Age of Onset  . Heart attack Father   . Prostatitis Neg Hx   . Prostate cancer Neg Hx   . Kidney cancer Neg Hx   . Bladder Cancer Neg Hx     Social History:  reports that he has never smoked. He has never used smokeless tobacco. He reports that he drinks alcohol. He reports that he does not use drugs.  ROS:                                        Physical Exam: BP 135/73   Pulse 100   Ht  (1.778 m)   Wt 263 lb (119.3 kg)   BMI 37.74 kg/m   Constitutional:  Alert and oriented, No acute distress. HEENT: [place] AT, moist mucus membranes.  Trachea midline, no masses. Cardiovascular: No clubbing, cyanosis, or edema. Respiratory: Normal  respiratory effort, no increased work of breathing. GI: Abdomen is soft, nontender, nondistended, no abdominal masses GU: No CVA tenderness.  Skin: No rashes, bruises or suspicious lesions. Neurologic: Grossly intact, no focal deficits, moving all 4 extremities. Psychiatric: Normal mood and affect.  Laboratory Data: Lab Results  Component Value Date   WBC 7.9 11/15/2014   HGB 16.1 11/15/2014   HCT 47.7 11/15/2014   MCV 88.4 11/15/2014   PLT 227 11/15/2014    Lab Results  Component Value Date   CREATININE 1.03 11/15/2014    Urinalysis    Component Value Date/Time   COLORURINE YELLOW 05/08/2016 1531   APPEARANCEUR CLEAR 05/08/2016 1531   LABSPEC 1.025 05/08/2016 1531   PHURINE 6.0 05/08/2016 1531   GLUCOSEU NEGATIVE 05/08/2016 1531   HGBUR MODERATE (A) 05/08/2016 1531   BILIRUBINUR NEGATIVE 05/08/2016 1531   KETONESUR NEGATIVE 05/08/2016 1531   PROTEINUR  NEGATIVE 05/08/2016 1531   NITRITE NEGATIVE 05/08/2016 1531   LEUKOCYTESUR NEGATIVE 05/08/2016 1531    Pertinent Imaging: EXAM: ABDOMEN - 1 VIEW  COMPARISON:  CT 05/19/2016  FINDINGS: Nonobstructed bowel-gas pattern. Round opacities in the right hemiabdomen appear to project over the colon and could relate to colon diverticula. Calcifications in the right upper quadrant, may relate to multiple small stones in the right kidney.  9 mm stone projects over lower pole of left kidney. Round opacity to the left of the spine is likely related to something within the colon. No significant calcifications in the pelvis.  IMPRESSION: 1. Nonobstructed gas pattern 2. Small round opacities in the right lower quadrant are suspected to be related to colon diverticula, appendicolith is a potential consideration. If symptomology suggests appendicitis, a CT follow-up would be advised. 3. Multiple calcifications in the right upper quadrant likely relate to kidney stones. 9 mm stone projects over lower pole left kidney. These results will be called to the ordering clinician or representative by the Radiologist Assistant, and communication documented in the PACS or zVision Dashboard.   Electronically Signed   By: Jasmine Pang M.D.   On: 06/19/2016 20:11  KUB personally today.  I addition to the above, slowly 8 mm right paraspinous opacity is identified at the level of L4 consistent with his known stone.  Assessment & Plan:    1. Right ureteral stone 8 mm mid/distal ureteral stone still present today on KUB 1 month after initial scan Asymptomatic Given that the stone has failed to pass in >30 days, patient advised to consider surgical intervention We discussed various treatment options including ESWL vs. ureteroscopy, laser lithotripsy, and stent. We discussed the risks and benefits of both including bleeding, infection, damage to surrounding structures, efficacy with need for possible  further intervention, and need for temporary ureteral stent.  He is most interested in ESWL:. Will arrange.  Patient was advised to continue to strain his urine and we will cancel if he happens to pass the stone in the interim.     2. History of nephrolithiasis As above Multiple additional small nonobstructing bilateral stones  3. Microscopic hematuria Likely secondary to ureteral stone Will recheck urine once stone episode resolved If microscopic hematuria persists, we'll pursue cystoscopy as discussed  4. Urinary frequency Continue Flomax and finasteride We'll reassess next visit   Schedule right EWL  [name], MD  [facility] [address], [address] [place], [place] [address] [phone]

## 2016-06-19 NOTE — Telephone Encounter (Signed)
No answer. No vm setup.  Will send a letter.

## 2016-06-22 ENCOUNTER — Other Ambulatory Visit: Payer: Self-pay | Admitting: Radiology

## 2016-06-22 ENCOUNTER — Telehealth: Payer: Self-pay | Admitting: Radiology

## 2016-06-22 DIAGNOSIS — N201 Calculus of ureter: Secondary | ICD-10-CM

## 2016-06-22 NOTE — Telephone Encounter (Signed)
Notified pt of arrival time to [facility] for ESWL on 06/25/16 will be 6:30am & of follow up appt. Questions answered. Pt voices understanding.

## 2016-06-22 NOTE — Telephone Encounter (Signed)
-----   Message from [name], MD sent at 06/21/2016 12:29 PM EDT ----- Regarding: stone patient I booked this [facility] for right ESWL.  Maralyn Sago has the paper work.  Can you please arrange?  [name], MD

## 2016-06-22 NOTE — Telephone Encounter (Signed)
LMOM

## 2016-06-23 ENCOUNTER — Encounter: Payer: Self-pay | Admitting: *Deleted

## 2016-06-24 MED ORDER — DIAZEPAM 5 MG PO TABS
10.0000 mg | ORAL_TABLET | ORAL | Status: AC
  Administered 2016-06-25: 10 mg via ORAL

## 2016-06-24 MED ORDER — DIPHENHYDRAMINE HCL 25 MG PO CAPS
25.0000 mg | ORAL_CAPSULE | ORAL | Status: AC
  Administered 2016-06-25: 25 mg via ORAL

## 2016-06-24 MED ORDER — CIPROFLOXACIN HCL 500 MG PO TABS
500.0000 mg | ORAL_TABLET | ORAL | Status: AC
  Administered 2016-06-25: 500 mg via ORAL

## 2016-06-25 ENCOUNTER — Ambulatory Visit
Admission: RE | Admit: 2016-06-25 | Discharge: 2016-06-25 | Disposition: A | Discharge status: Home / Self Care | Payer: BLUE CROSS/BLUE SHIELD | Source: Ambulatory Visit | Attending: Urology | Admitting: Urology

## 2016-06-25 ENCOUNTER — Ambulatory Visit: Payer: BLUE CROSS/BLUE SHIELD

## 2016-06-25 ENCOUNTER — Encounter
Admission: RE | Disposition: A | Discharge status: Home / Self Care | Payer: Self-pay | Source: Ambulatory Visit | Attending: Urology

## 2016-06-25 DIAGNOSIS — N201 Calculus of ureter: Secondary | ICD-10-CM

## 2016-06-25 DIAGNOSIS — E785 Hyperlipidemia, unspecified: Secondary | ICD-10-CM | POA: Diagnosis not present

## 2016-06-25 DIAGNOSIS — Z8719 Personal history of other diseases of the digestive system: Secondary | ICD-10-CM | POA: Insufficient documentation

## 2016-06-25 DIAGNOSIS — I10 Essential (primary) hypertension: Secondary | ICD-10-CM | POA: Insufficient documentation

## 2016-06-25 DIAGNOSIS — Z79899 Other long term (current) drug therapy: Secondary | ICD-10-CM | POA: Diagnosis not present

## 2016-06-25 DIAGNOSIS — Z87442 Personal history of urinary calculi: Secondary | ICD-10-CM | POA: Diagnosis not present

## 2016-06-25 DIAGNOSIS — N200 Calculus of kidney: Secondary | ICD-10-CM | POA: Diagnosis present

## 2016-06-25 DIAGNOSIS — G4730 Sleep apnea, unspecified: Secondary | ICD-10-CM | POA: Insufficient documentation

## 2016-06-25 HISTORY — DX: Calculus of kidney: N20.0

## 2016-06-25 HISTORY — PX: EXTRACORPOREAL SHOCK WAVE LITHOTRIPSY: SHX1557

## 2016-06-25 HISTORY — DX: Gastro-esophageal reflux disease without esophagitis: K21.9

## 2016-06-25 SURGERY — LITHOTRIPSY, ESWL
Anesthesia: Moderate Sedation | Laterality: Right

## 2016-06-25 MED ORDER — CIPROFLOXACIN HCL 500 MG PO TABS
ORAL_TABLET | ORAL | Status: AC
  Administered 2016-06-25: 500 mg via ORAL
  Filled 2016-06-25: qty 1

## 2016-06-25 MED ORDER — HYDROCODONE-ACETAMINOPHEN 5-325 MG PO TABS: 1.0000 | ORAL_TABLET | Freq: Four times a day (QID) | ORAL | 0 refills | Status: DC | PRN

## 2016-06-25 MED ORDER — DEXTROSE-NACL 5-0.45 % IV SOLN
INTRAVENOUS | Status: DC
  Administered 2016-06-25: 07:00:00 via INTRAVENOUS

## 2016-06-25 MED ORDER — DIPHENHYDRAMINE HCL 25 MG PO CAPS
ORAL_CAPSULE | ORAL | Status: AC
  Filled 2016-06-25: qty 1

## 2016-06-25 MED ORDER — DIAZEPAM 5 MG PO TABS
ORAL_TABLET | ORAL | Status: AC
  Administered 2016-06-25: 10 mg via ORAL
  Filled 2016-06-25: qty 2

## 2016-06-25 NOTE — H&P (View-Only) (Signed)
 06/19/2016 4:43 PM   Joe Dunlap 11/02/1960 [id number]  Referring provider: Dale E Feldpausch, MD [address] [place], [place] [address]  Chief Complaint  Patient presents with  . Results    CT    HPI: 55-year-old male who initially presented with multiple GU issues including urinary frequency, BPH with lots, microsocopic hematuria, and history of kidney stones returns today for routine follow-up.  He underwent CT urogram on 05/19/16 for further workup of microscopic hematuria to have an incidental 6 mm proximal nonobstructing calculus. Since the time of the study, he's been straining his urine routinely and has not seen the stone pass.  He occasionally has some nonspecific flank pain but no severe pain associated with this stone. He does have a personal history stones and is required to undergo ESWL in the past.    He is currently on Flomax and he was started on finasteride at last visit one month ago in addition to this for us urinary symptoms. He seen some mild but not dramatic improvement in his urinary flow.   PMH: Past Medical History:  Diagnosis Date  . Hyperlipemia   . Hypertension   . Sleep apnea   . Stomach ulcer     Surgical History: Past Surgical History:  Procedure Laterality Date  . KNEE SURGERY Left   . UPPER GI ENDOSCOPY  2008   stomach ulcer  . UVULOPALATOPHARYNGOPLASTY (UPPP)/TONSILLECTOMY/SEPTOPLASTY  2000    Home Medications:  Allergies as of 06/19/2016   No Known Allergies     Medication List       Accurate as of 06/19/16  4:43 PM. Always use your most recent med list.          amLODipine 5 MG tablet Commonly known as:  NORVASC Take 5 mg by mouth daily.   dicyclomine 20 MG tablet Commonly known as:  BENTYL Take 1 tablet (20 mg total) by mouth 2 (two) times daily.   finasteride 5 MG tablet Commonly known as:  PROSCAR Take 1 tablet (5 mg total) by mouth daily.   meclizine 25 MG tablet Commonly known as:  ANTIVERT Take 1  tablet (25 mg total) by mouth 3 (three) times daily as needed for dizziness.   pravastatin 10 MG tablet Commonly known as:  PRAVACHOL Take 40 mg by mouth daily.   tamsulosin 0.4 MG Caps capsule Commonly known as:  FLOMAX TAKE 1 CAPSULE(0.4 MG) BY MOUTH DAILY       Allergies: No Known Allergies  Family History: Family History  Problem Relation Age of Onset  . Heart attack Father   . Prostatitis Neg Hx   . Prostate cancer Neg Hx   . Kidney cancer Neg Hx   . Bladder Cancer Neg Hx     Social History:  reports that he has never smoked. He has never used smokeless tobacco. He reports that he drinks alcohol. He reports that he does not use drugs.  ROS:                                        Physical Exam: BP 135/73   Pulse 100   Ht 5' 10" (1.778 m)   Wt 263 lb (119.3 kg)   BMI 37.74 kg/m   Constitutional:  Alert and oriented, No acute distress. HEENT: [place] AT, moist mucus membranes.  Trachea midline, no masses. Cardiovascular: No clubbing, cyanosis, or edema. Respiratory: Normal   respiratory effort, no increased work of breathing. GI: Abdomen is soft, nontender, nondistended, no abdominal masses GU: No CVA tenderness.  Skin: No rashes, bruises or suspicious lesions. Neurologic: Grossly intact, no focal deficits, moving all 4 extremities. Psychiatric: Normal mood and affect.  Laboratory Data: Lab Results  Component Value Date   WBC 7.9 11/15/2014   HGB 16.1 11/15/2014   HCT 47.7 11/15/2014   MCV 88.4 11/15/2014   PLT 227 11/15/2014    Lab Results  Component Value Date   CREATININE 1.03 11/15/2014    Urinalysis    Component Value Date/Time   COLORURINE YELLOW 05/08/2016 1531   APPEARANCEUR CLEAR 05/08/2016 1531   LABSPEC 1.025 05/08/2016 1531   PHURINE 6.0 05/08/2016 1531   GLUCOSEU NEGATIVE 05/08/2016 1531   HGBUR MODERATE (A) 05/08/2016 1531   BILIRUBINUR NEGATIVE 05/08/2016 1531   KETONESUR NEGATIVE 05/08/2016 1531   PROTEINUR  NEGATIVE 05/08/2016 1531   NITRITE NEGATIVE 05/08/2016 1531   LEUKOCYTESUR NEGATIVE 05/08/2016 1531    Pertinent Imaging: EXAM: ABDOMEN - 1 VIEW  COMPARISON:  CT 05/19/2016  FINDINGS: Nonobstructed bowel-gas pattern. Round opacities in the right hemiabdomen appear to project over the colon and could relate to colon diverticula. Calcifications in the right upper quadrant, may relate to multiple small stones in the right kidney.  9 mm stone projects over lower pole of left kidney. Round opacity to the left of the spine is likely related to something within the colon. No significant calcifications in the pelvis.  IMPRESSION: 1. Nonobstructed gas pattern 2. Small round opacities in the right lower quadrant are suspected to be related to colon diverticula, appendicolith is a potential consideration. If symptomology suggests appendicitis, a CT follow-up would be advised. 3. Multiple calcifications in the right upper quadrant likely relate to kidney stones. 9 mm stone projects over lower pole left kidney. These results will be called to the ordering clinician or representative by the Radiologist Assistant, and communication documented in the PACS or zVision Dashboard.   Electronically Signed   By: Jasmine Pang M.D.   On: 06/19/2016 20:11  KUB personally today.  I addition to the above, slowly 8 mm right paraspinous opacity is identified at the level of L4 consistent with his known stone.  Assessment & Plan:    1. Right ureteral stone 8 mm mid/distal ureteral stone still present today on KUB 1 month after initial scan Asymptomatic Given that the stone has failed to pass in >30 days, patient advised to consider surgical intervention We discussed various treatment options including ESWL vs. ureteroscopy, laser lithotripsy, and stent. We discussed the risks and benefits of both including bleeding, infection, damage to surrounding structures, efficacy with need for possible  further intervention, and need for temporary ureteral stent.  He is most interested in ESWL:. Will arrange.  Patient was advised to continue to strain his urine and we will cancel if he happens to pass the stone in the interim.     2. History of nephrolithiasis As above Multiple additional small nonobstructing bilateral stones  3. Microscopic hematuria Likely secondary to ureteral stone Will recheck urine once stone episode resolved If microscopic hematuria persists, we'll pursue cystoscopy as discussed  4. Urinary frequency Continue Flomax and finasteride We'll reassess next visit   Schedule right EWL  [name], MD  [facility] [address], [address] [place], [place] [address] [phone]

## 2016-06-25 NOTE — Interval H&P Note (Signed)
History and Physical Interval Note:  06/25/2016 7:49 AM  Joe Dunlap  has presented today for surgery, with the diagnosis of Kidney stone  The various methods of treatment have been discussed with the patient and family. After consideration of risks, benefits and other options for treatment, the patient has consented to  Procedure(s): EXTRACORPOREAL SHOCK WAVE LITHOTRIPSY (ESWL) (Right) as a surgical intervention .  The patient's history has been reviewed, patient examined, no change in status, stable for surgery.  I have reviewed the patient's chart and labs.  Questions were answered to the patient's satisfaction.    RRR CTAB  [name]

## 2016-06-25 NOTE — Discharge Instructions (Signed)
See [facility] discharge instructions in chart.  

## 2016-06-25 NOTE — OR Nursing (Signed)
Right flank has some redness where lithotripsy was done, no open skin. Dr. Apolinar Junes into see pt.

## 2016-06-25 NOTE — OR Nursing (Signed)
Right flank remains reddened, no open skin.

## 2016-07-10 ENCOUNTER — Ambulatory Visit: Payer: BLUE CROSS/BLUE SHIELD | Admitting: Urology

## 2016-07-10 ENCOUNTER — Ambulatory Visit (INDEPENDENT_AMBULATORY_CARE_PROVIDER_SITE_OTHER): Payer: BLUE CROSS/BLUE SHIELD | Admitting: Urology

## 2016-07-10 ENCOUNTER — Ambulatory Visit
Admission: RE | Admit: 2016-07-10 | Discharge: 2016-07-10 | Disposition: A | Discharge status: Home / Self Care | Payer: BLUE CROSS/BLUE SHIELD | Source: Ambulatory Visit | Attending: Urology | Admitting: Urology

## 2016-07-10 ENCOUNTER — Encounter: Payer: Self-pay | Admitting: Urology

## 2016-07-10 VITALS — BP 145/86 | HR 88 | Ht 70.0 in | Wt 254.0 lb

## 2016-07-10 DIAGNOSIS — R350 Frequency of micturition: Secondary | ICD-10-CM

## 2016-07-10 DIAGNOSIS — Z87442 Personal history of urinary calculi: Secondary | ICD-10-CM

## 2016-07-10 DIAGNOSIS — N200 Calculus of kidney: Secondary | ICD-10-CM | POA: Diagnosis not present

## 2016-07-10 DIAGNOSIS — R3129 Other microscopic hematuria: Secondary | ICD-10-CM | POA: Diagnosis not present

## 2016-07-10 DIAGNOSIS — N201 Calculus of ureter: Secondary | ICD-10-CM

## 2016-07-10 NOTE — Progress Notes (Signed)
07/10/2016 3:21 PM   Mitchell Heir [facility] 23-May-1960 [id number]  Referring provider: Marina Goodell, MD [address] [place], [place] [address]  Chief Complaint  Patient presents with  . Follow-up    KUB,ureteral stone     HPI: 56 year old male who initially presented with multiple GU issues including urinary frequency, BPH with LUTS, microsocopic hematuria, and history of kidney stones returns today 2 weeks s/p ESWL.  Stones 2 weeks status post right ESWL for a 6 mm proximal ureteral calculus. Following procedure, he passed several large pieces which he brings with him today. The procedure was well tolerated.  KUB today's unremarkable other than for no nonobstructing stones bilaterally up to 5 mm.  He does have a history of bilateral nephrolithiasis. He underwent shockwave lithotripsy 1 in the remote past.  Microscopic hematuria He underwent CT urogram on 05/19/16 for further workup of microscopic hematuria to have an incidental 6 mm proximal nonobstructing calculus.   Given the presence of an obstructing stone at the time of CT urogram, microscopic blood was presumably related to stone.  Cysto deferred for now.    BPH with LUTS He is currently on Flomax and he was started on finasteride started ~6 weeks month ago in addition to this for [place] urinary symptoms. He seen some mild but not dramatic improvement in his urinary flow.  He continues to struggle with urinary frequency and urgency as his primary complaint.  Nocturia x 1.    He drinks primarily water. He does drink at least once a day and is previously tried cutting this out without improvement of the symptoms.  PVR previously 52 on 04/2016.  Most recent PSA on 05/30/2015 was 1.11.   Rectal exam 04/2016 40+ cc prostate, nontender, no nodules.  ED He does also have a history of erectile dysfunction. He was present prescribed 50 mg tablet of Viagra by his primary care physician  PMH: Past Medical History:  Diagnosis  Date  . GERD (gastroesophageal reflux disease)   . Hyperlipemia   . Hypertension   . Kidney stones 2018  . Sleep apnea   . Stomach ulcer     Surgical History: Past Surgical History:  Procedure Laterality Date  . EXTRACORPOREAL SHOCK WAVE LITHOTRIPSY Right 06/25/2016   Procedure: EXTRACORPOREAL SHOCK WAVE LITHOTRIPSY (ESWL);  Surgeon: [name], MD;  Location: [facility] ORS;  Service: Urology;  Laterality: Right;  . KNEE SURGERY Left   . UPPER GI ENDOSCOPY  2008   stomach ulcer  . UVULOPALATOPHARYNGOPLASTY (UPPP)/TONSILLECTOMY/SEPTOPLASTY  2000    Home Medications:  Allergies as of 07/10/2016   No Known Allergies     Medication List       Accurate as of 07/10/16  3:21 PM. Always use your most recent med list.          amLODipine 5 MG tablet Commonly known as:  NORVASC Take 5 mg by mouth daily.   dicyclomine 20 MG tablet Commonly known as:  BENTYL Take 1 tablet (20 mg total) by mouth 2 (two) times daily.   finasteride 5 MG tablet Commonly known as:  PROSCAR Take 1 tablet (5 mg total) by mouth daily.   nortriptyline 50 MG capsule Commonly known as:  PAMELOR   pravastatin 10 MG tablet Commonly known as:  PRAVACHOL Take 40 mg by mouth daily.   tamsulosin 0.4 MG Caps capsule Commonly known as:  FLOMAX TAKE 1 CAPSULE(0.4 MG) BY MOUTH DAILY       Allergies: No Known Allergies  Family History:  Family History  Problem Relation Age of Onset  . Heart attack Father   . Prostatitis Neg Hx   . Prostate cancer Neg Hx   . Kidney cancer Neg Hx   . Bladder Cancer Neg Hx     Social History:  reports that he has never smoked. He has never used smokeless tobacco. He reports that he drinks alcohol. He reports that he does not use drugs.  ROS: UROLOGY Frequent Urination?: Yes Hard to postpone urination?: No Burning/pain with urination?: No Get up at night to urinate?: Yes Leakage of urine?: No Urine stream starts and stops?: No Trouble starting stream?: No Do  you have to strain to urinate?: No Blood in urine?: No Urinary tract infection?: No Sexually transmitted disease?: No Injury to kidneys or bladder?: No Painful intercourse?: No Weak stream?: No Erection problems?: Yes Penile pain?: No  Gastrointestinal Nausea?: No Vomiting?: No Indigestion/heartburn?: No Diarrhea?: No Constipation?: No  Constitutional Fever: No Night sweats?: No Weight loss?: No Fatigue?: No  Skin Skin rash/lesions?: No Itching?: No  Eyes Blurred vision?: No Double vision?: No  Ears/Nose/Throat Sore throat?: No Sinus problems?: No  Hematologic/Lymphatic Swollen glands?: No Easy bruising?: No  Cardiovascular Leg swelling?: No Chest pain?: No  Respiratory Cough?: No Shortness of breath?: No  Endocrine Excessive thirst?: No  Musculoskeletal Back pain?: No Joint pain?: No  Neurological Headaches?: No Dizziness?: No  Psychologic Depression?: No Anxiety?: No  Physical Exam: BP (!) 145/86   Pulse 88   Ht 5\' 10"  (1.778 m)   Wt 254 lb (115.2 kg)   BMI 36.45 kg/m   Constitutional:  Alert and oriented, No acute distress.   HEENT: [place] AT, moist mucus membranes.  Trachea midline, no masses. Cardiovascular: No clubbing, cyanosis, or edema. Respiratory: Normal respiratory effort, no increased work of breathing. GI: Abdomen is soft, nontender, nondistended, no abdominal masses GU: No CVA tenderness.  Skin: No rashes, bruises or suspicious lesions. Neurologic: Grossly intact, no focal deficits, moving all 4 extremities. Psychiatric: Anxious and tearful.  Laboratory Data: Lab Results  Component Value Date   WBC 7.9 11/15/2014   HGB 16.1 11/15/2014   HCT 47.7 11/15/2014   MCV 88.4 11/15/2014   PLT 227 11/15/2014    Lab Results  Component Value Date   CREATININE 1.03 11/15/2014    Urinalysis    Component Value Date/Time   COLORURINE YELLOW 06/19/2016 1621   APPEARANCEUR CLEAR 06/19/2016 1621   LABSPEC 1.025 06/19/2016 1621    PHURINE 5.5 06/19/2016 1621   GLUCOSEU NEGATIVE 06/19/2016 1621   HGBUR MODERATE (A) 06/19/2016 1621   BILIRUBINUR NEGATIVE 06/19/2016 1621   KETONESUR TRACE (A) 06/19/2016 1621   PROTEINUR NEGATIVE 06/19/2016 1621   NITRITE NEGATIVE 06/19/2016 1621   LEUKOCYTESUR NEGATIVE 06/19/2016 1621    Pertinent Imaging: KUB personally today.   Bilateral nonobstructing calculi, no obvious ureteral calculus residual.   Assessment & Plan:    1. Right ureteral ston 8 mm mid/distal ureteral stone s/p ESWL without residual stone fragments Stones sent today for stone analysis  2. Bilateral nephrolithiasis Multiple additional small nonobstructing bilateral stones up to 5 mm  We discussed general stone prevention techniques including drinking plenty water with goal of producing 2.5 L urine daily, increased citric acid intake, avoidance of high oxalate containing foods, and decreased salt intake.  Information about dietary recommendations given today.   3. Microscopic hematuria Will recheck urine next visit If microscopic hematuria persists, we'll pursue cystoscopy as discussed  4. Urinary frequency Continue Flomax and  finasteride Samples of Mybetriq 50 mg 1 month given today, I advised to call if effective to call in script    Return in about 3 months (around 10/10/2016) for UA, PVR, IPSS, possible cysto if +UA for blood.  Vanna ScotlandAshley Braheem Tomasik, MD  [facility] [address], [address] [place], [place] [id number] [phone]

## 2016-07-17 ENCOUNTER — Ambulatory Visit: Payer: Self-pay | Admitting: Urology

## 2016-07-22 ENCOUNTER — Other Ambulatory Visit: Payer: Self-pay | Admitting: Urology

## 2016-10-09 ENCOUNTER — Ambulatory Visit: Payer: BLUE CROSS/BLUE SHIELD | Admitting: Urology

## 2016-10-15 ENCOUNTER — Other Ambulatory Visit: Payer: Self-pay

## 2016-10-15 DIAGNOSIS — R3129 Other microscopic hematuria: Secondary | ICD-10-CM

## 2016-10-16 ENCOUNTER — Ambulatory Visit (INDEPENDENT_AMBULATORY_CARE_PROVIDER_SITE_OTHER): Payer: BLUE CROSS/BLUE SHIELD | Admitting: Urology

## 2016-10-16 ENCOUNTER — Other Ambulatory Visit
Admission: RE | Admit: 2016-10-16 | Discharge: 2016-10-16 | Disposition: A | Discharge status: Home / Self Care | Payer: BLUE CROSS/BLUE SHIELD | Source: Ambulatory Visit | Attending: Urology | Admitting: Urology

## 2016-10-16 ENCOUNTER — Encounter: Payer: Self-pay | Admitting: Urology

## 2016-10-16 VITALS — BP 141/78 | HR 96 | Ht 70.0 in | Wt 266.0 lb

## 2016-10-16 DIAGNOSIS — N400 Enlarged prostate without lower urinary tract symptoms: Secondary | ICD-10-CM | POA: Diagnosis not present

## 2016-10-16 DIAGNOSIS — R3129 Other microscopic hematuria: Secondary | ICD-10-CM | POA: Insufficient documentation

## 2016-10-16 DIAGNOSIS — N200 Calculus of kidney: Secondary | ICD-10-CM | POA: Diagnosis not present

## 2016-10-16 DIAGNOSIS — R350 Frequency of micturition: Secondary | ICD-10-CM | POA: Diagnosis not present

## 2016-10-16 DIAGNOSIS — N529 Male erectile dysfunction, unspecified: Secondary | ICD-10-CM | POA: Diagnosis not present

## 2016-10-16 LAB — URINALYSIS, COMPLETE (UACMP) WITH MICROSCOPIC
BACTERIA UA: NONE SEEN
BILIRUBIN URINE: NEGATIVE
GLUCOSE, UA: NEGATIVE mg/dL
Hgb urine dipstick: NEGATIVE
KETONES UR: NEGATIVE mg/dL
LEUKOCYTES UA: NEGATIVE
NITRITE: NEGATIVE
PROTEIN: NEGATIVE mg/dL
RBC / HPF: NONE SEEN RBC/hpf (ref 0–5)
SQUAMOUS EPITHELIAL / LPF: NONE SEEN
Specific Gravity, Urine: 1.02 (ref 1.005–1.030)
pH: 7 (ref 5.0–8.0)

## 2016-10-16 LAB — BLADDER SCAN AMB NON-IMAGING: Scan Result: 92

## 2016-10-16 MED ORDER — SILDENAFIL CITRATE 20 MG PO TABS: 20.0000 mg | ORAL_TABLET | ORAL | 11 refills | Status: DC | PRN

## 2016-10-16 MED ORDER — OXYBUTYNIN CHLORIDE 5 MG PO TABS
5.0000 mg | ORAL_TABLET | Freq: Three times a day (TID) | ORAL | 11 refills | Status: DC | PRN
Start: 2016-10-16 — End: 2017-05-07

## 2016-10-16 NOTE — Progress Notes (Signed)
10/16/2016 5:19 PM   Joe Dunlap [place] July 31, 1960 [id number]  Referring provider: Marina Goodell, MD [address] [place], [place] [address]  Chief Complaint  Patient presents with  . Follow-up    3 month ureteral stone    HPI: 56 year old male who initially presented with multiple GU issues including urinary frequency, BPH with LUTS, microsocopic hematuria, and history of kidney stones today for routine follow up  Stones Pre-2018 s/p ESWL x 1 in remote base S/p sucessful right ESWL for a 6 mm proximal ureteral calculus on 2/18 KUB shows bilateral stones, up to 9 mm in the LLP No recent flank pain, reports passing 2 stones since last visit  Microscopic hematuria He underwent CT urogram on 05/19/16 for further workup of microscopic hematuria to have an incidental 6 mm proximal nonobstructing calculus.   Cysto deferred due to presence of ureteral stone  UA today negative for blood  BPH with LUTS Currently on finasteride. He stopped taking Flomax for unclear reasons although states it wasn't helping him much.  Given samples of Mybetriq 25 mg without any improvement.  He continues to struggle with urinary frequency and urgency as his primary complaint.  Overall, he reports that his symptoms are approximately 25% improved since our first visit.  Symptoms primarily early in the morning upon awakening and improved throughout the day.  Nocturia x 1.    He drinks primarily water. He does drink at least once a day and is previously tried cutting this out without improvement of the symptoms.  PVR previously 52 on 04/2016.  Most recent PSA on 05/30/2015 was 1.11.   Rectal exam 04/2016 40+ cc prostate, nontender, no nodules.      IPSS    Row Name 10/16/16 1600         International Prostate Symptom Score   How often have you had the sensation of not emptying your bladder? Not at All     How often have you had to urinate less than every two hours? Almost always     How often  have you found you stopped and started again several times when you urinated? Not at All     How often have you found it difficult to postpone urination? Less than half the time     How often have you had a weak urinary stream? Not at All     How often have you had to strain to start urination? Not at All     How many times did you typically get up at night to urinate? 1 Time     Total IPSS Score 8       Quality of Life due to urinary symptoms   If you were to spend the rest of your life with your urinary condition just the way it is now how would you feel about that? Mostly Disatisfied        Score:  1-7 Mild 8-19 Moderate 20-35 Severe  ED He does also have a history of erectile dysfunction. He was present prescribed 50 mg tablet of Viagra by his primary care physician which worked well. Requesting generic today.  PMH: Past Medical History:  Diagnosis Date  . GERD (gastroesophageal reflux disease)   . Hyperlipemia   . Hypertension   . Kidney stones 2018  . Sleep apnea   . Stomach ulcer     Surgical History: Past Surgical History:  Procedure Laterality Date  . EXTRACORPOREAL SHOCK WAVE LITHOTRIPSY Right 06/25/2016   Procedure: EXTRACORPOREAL  SHOCK WAVE LITHOTRIPSY (ESWL);  Surgeon: [name], MD;  Location: [facility] ORS;  Service: Urology;  Laterality: Right;  . KNEE SURGERY Left   . UPPER GI ENDOSCOPY  2008   stomach ulcer  . UVULOPALATOPHARYNGOPLASTY (UPPP)/TONSILLECTOMY/SEPTOPLASTY  2000    Home Medications:  Allergies as of 10/16/2016      Reactions   Nsaids Other (See Comments)   Hx of ulcer      Medication List       Accurate as of 10/16/16  5:19 PM. Always use your most recent med list.          amLODipine 5 MG tablet Commonly known as:  NORVASC Take 5 mg by mouth daily.   dicyclomine 20 MG tablet Commonly known as:  BENTYL Take 1 tablet (20 mg total) by mouth 2 (two) times daily.   finasteride 5 MG tablet Commonly known as:  PROSCAR Take 1  tablet (5 mg total) by mouth daily.   nortriptyline 50 MG capsule Commonly known as:  PAMELOR   oxybutynin 5 MG tablet Commonly known as:  DITROPAN Take 1 tablet (5 mg total) by mouth every 8 (eight) hours as needed for bladder spasms.   pravastatin 10 MG tablet Commonly known as:  PRAVACHOL Take 40 mg by mouth daily.   sildenafil 20 MG tablet Commonly known as:  REVATIO Take 1 tablet (20 mg total) by mouth as needed. Take 1-5 tabs as needed prior to intercourse       Allergies:  Allergies  Allergen Reactions  . Nsaids Other (See Comments)    Hx of ulcer    Family History: Family History  Problem Relation Age of Onset  . Heart attack Father   . Prostatitis Neg Hx   . Prostate cancer Neg Hx   . Kidney cancer Neg Hx   . Bladder Cancer Neg Hx     Social History:  reports that he has never smoked. He has never used smokeless tobacco. He reports that he drinks alcohol. He reports that he does not use drugs.  ROS: UROLOGY Frequent Urination?: Yes Hard to postpone urination?: No Burning/pain with urination?: No Get up at night to urinate?: Yes Leakage of urine?: No Urine stream starts and stops?: No Trouble starting stream?: No Do you have to strain to urinate?: No Blood in urine?: No Urinary tract infection?: No Sexually transmitted disease?: No Injury to kidneys or bladder?: No Painful intercourse?: No Weak stream?: No Erection problems?: Yes Penile pain?: No  Gastrointestinal Nausea?: No Vomiting?: No Indigestion/heartburn?: No Diarrhea?: No Constipation?: No  Constitutional Fever: No Night sweats?: No Weight loss?: No Fatigue?: No  Skin Skin rash/lesions?: No Itching?: No  Eyes Blurred vision?: No Double vision?: No  Ears/Nose/Throat Sore throat?: No Sinus problems?: No  Hematologic/Lymphatic Swollen glands?: No Easy bruising?: No  Cardiovascular Leg swelling?: No Chest pain?: No  Respiratory Cough?: No Shortness of breath?:  No  Endocrine Excessive thirst?: No  Musculoskeletal Back pain?: No Joint pain?: No  Neurological Headaches?: No Dizziness?: No  Psychologic Depression?: No Anxiety?: No  Physical Exam: BP (!) 141/78   Pulse 96   Ht 5\' 10"  (1.778 m)   Wt 266 lb (120.7 kg)   BMI 38.17 kg/m   Constitutional:  Alert and oriented, No acute distress.   HEENT:  AT, moist mucus membranes.  Trachea midline, no masses. Cardiovascular: No clubbing, cyanosis, or edema. Respiratory: Normal respiratory effort, no increased work of breathing. GI: Abdomen is soft, nontender, nondistended, no abdominal masses GU: No  CVA tenderness.  Skin: No rashes, bruises or suspicious lesions. Neurologic: Grossly intact, no focal deficits, moving all 4 extremities. Psychiatric: Appropriate, normal mood and affect today.  Laboratory Data: Lab Results  Component Value Date   WBC 7.9 11/15/2014   HGB 16.1 11/15/2014   HCT 47.7 11/15/2014   MCV 88.4 11/15/2014   PLT 227 11/15/2014    Lab Results  Component Value Date   CREATININE 1.03 11/15/2014    Urinalysis    Component Value Date/Time   COLORURINE YELLOW 10/16/2016 1528   APPEARANCEUR CLEAR 10/16/2016 1528   LABSPEC 1.020 10/16/2016 1528   PHURINE 7.0 10/16/2016 1528   GLUCOSEU NEGATIVE 10/16/2016 1528   HGBUR NEGATIVE 10/16/2016 1528   BILIRUBINUR NEGATIVE 10/16/2016 1528   KETONESUR NEGATIVE 10/16/2016 1528   PROTEINUR NEGATIVE 10/16/2016 1528   NITRITE NEGATIVE 10/16/2016 1528   LEUKOCYTESUR NEGATIVE 10/16/2016 1528    Pertinent Imaging: No new imaging   Assessment & Plan:    1. Bilateral nephrolithiasis Multiple additional small nonobstructing bilateral stones up to 9 mm Currently asymptomatic Recommend   2. Microscopic hematuria UA today negative Microscopic hematuria in the setting of an obstructing ureteral calculus which is now resolved excellent  we'll defer cystoscopy unless hematuria recurs  3. Urinary frequency Continue  finasteride Does not wish to continue Flomax Recommend oxybutynin on when necessary basis for a.m. symptoms, side effects reviewed Advised to call in 30 days if this is not effective, we'll change medications by telephone  4. ED Viagra 20 mg sent to [facility] Reviewed side effects and instructions for use   Return in about 6 months (around 04/18/2017) for IPSS, PVR, PSA/ DRE, KUB.  [name], MD  [facility] [phone]

## 2016-12-04 ENCOUNTER — Telehealth: Payer: Self-pay | Admitting: Urology

## 2016-12-04 NOTE — Telephone Encounter (Signed)
Pt called back to let Dr Apolinar Junes know Oxybutynin was not working.  He said it gave him dry mouth, but wants to know if there's anything else he can try.  Please give pt a call.

## 2016-12-04 NOTE — Telephone Encounter (Signed)
Per Dr. Delana Meyer note, she was going to change medications by phone if the oxybutynin wasn't effective.

## 2016-12-04 NOTE — Telephone Encounter (Signed)
Please advise 

## 2016-12-07 NOTE — Telephone Encounter (Signed)
Please advise 

## 2016-12-07 NOTE — Telephone Encounter (Signed)
Lets try Mybetriq 25 mg daily. If we have a 30 day sample, lets try that.    [name], MD

## 2016-12-07 NOTE — Telephone Encounter (Signed)
Line busy

## 2016-12-09 NOTE — Telephone Encounter (Signed)
LMOM

## 2016-12-18 NOTE — Telephone Encounter (Signed)
Patient advised that he can try Myrbetriq 25mg .  I pulled 1 box (4 sleeves) of samples for him and placed up front for him to pick up.

## 2017-03-04 DIAGNOSIS — G44229 Chronic tension-type headache, not intractable: Secondary | ICD-10-CM | POA: Insufficient documentation

## 2017-04-15 ENCOUNTER — Telehealth: Payer: Self-pay | Admitting: Urology

## 2017-04-15 NOTE — Telephone Encounter (Signed)
Patient had to reschedule his app with you tomorrow 04-16-17 but reschd he wanted you to know that he passed two stones the first week of February.  Joe DusterMichelle

## 2017-04-16 ENCOUNTER — Ambulatory Visit: Payer: BLUE CROSS/BLUE SHIELD | Admitting: Urology

## 2017-05-07 ENCOUNTER — Ambulatory Visit
Admission: RE | Admit: 2017-05-07 | Discharge: 2017-05-07 | Disposition: A | Discharge status: Home / Self Care | Payer: BLUE CROSS/BLUE SHIELD | Source: Ambulatory Visit | Attending: Urology | Admitting: Urology

## 2017-05-07 ENCOUNTER — Other Ambulatory Visit
Admission: RE | Admit: 2017-05-07 | Discharge: 2017-05-07 | Disposition: A | Discharge status: Home / Self Care | Payer: BLUE CROSS/BLUE SHIELD | Source: Ambulatory Visit | Attending: Urology | Admitting: Urology

## 2017-05-07 ENCOUNTER — Ambulatory Visit (INDEPENDENT_AMBULATORY_CARE_PROVIDER_SITE_OTHER): Payer: Self-pay | Admitting: Urology

## 2017-05-07 ENCOUNTER — Encounter: Payer: Self-pay | Admitting: Urology

## 2017-05-07 ENCOUNTER — Other Ambulatory Visit: Payer: Self-pay | Admitting: Urology

## 2017-05-07 VITALS — BP 147/85 | HR 80 | Ht 70.0 in | Wt 264.0 lb

## 2017-05-07 DIAGNOSIS — N200 Calculus of kidney: Secondary | ICD-10-CM | POA: Diagnosis present

## 2017-05-07 DIAGNOSIS — Z87442 Personal history of urinary calculi: Secondary | ICD-10-CM

## 2017-05-07 DIAGNOSIS — N400 Enlarged prostate without lower urinary tract symptoms: Secondary | ICD-10-CM

## 2017-05-07 DIAGNOSIS — N401 Enlarged prostate with lower urinary tract symptoms: Secondary | ICD-10-CM

## 2017-05-07 DIAGNOSIS — R350 Frequency of micturition: Secondary | ICD-10-CM

## 2017-05-07 LAB — BLADDER SCAN AMB NON-IMAGING: Scan Result: 69

## 2017-05-07 NOTE — Progress Notes (Signed)
05/07/2017 11:26 AM   Joe Dunlap [birth year] [id number]  Referring provider: Marina Goodell, MD [address] [place], [place] [address]  Chief Complaint  Patient presents with  . Benign Prostatic Hypertrophy  . Urinary Frequency  . Nephrolithiasis    HPI: 57 year old male who initially presented with multiple GU issues including urinary frequency, BPH with LUTS, microsocopic hematuria, and history of kidney stones today for routine follow up  Stones Pre-2018 s/p ESWL x 1 in remote past S/p sucessful right ESWL for a 6 mm proximal ureteral calculus on 2/18 KUB shows bilateral stones, up to 9 mm in the LLP Brings small bag of <3 mm stones today that he has passed in the interim No flank pain today KUB 05/07/17 shows stable bilateral stone disease No previous 24 hour urine metabolic work up  Microscopic hematuria He underwent CT urogram on 05/19/16 for further workup of microscopic hematuria to have an incidental 6 mm proximal nonobstructing calculus.  Cysto deferred due to presence of ureteral stone at time F/u UA after interval stone passage negative for blood  BPH with LUTS Currently on finasteride. He stopped taking Flomax for unclear reasons although states it wasn't helping him much.  Given samples of Mybetriq 25 mg without any improvement.  He continues to struggle with urinary frequency and urgency as his primary complaint.     He drinks primarily water. He does drink at least once a day and is previously tried cutting this out without improvement of the symptoms.  PVR previously 52 on 04/2016.  Most recent PSA on 05/30/2015 was 1.11.   Rectal exam 04/2016 40+ cc prostate, nontender, no nodules.  IPSS    Row Name 05/07/17 1300         International Prostate Symptom Score   How often have you had the sensation of not emptying your bladder?  Not at All     How often have you had to urinate less than every two hours?  About half the time     How often have  you found you stopped and started again several times when you urinated?  Not at All     How often have you found it difficult to postpone urination?  About half the time     How often have you had a weak urinary stream?  Not at All     How often have you had to strain to start urination?  Not at All     How many times did you typically get up at night to urinate?  3 Times     Total IPSS Score  9       Quality of Life due to urinary symptoms   If you were to spend the rest of your life with your urinary condition just the way it is now how would you feel about that?  Terrible        Score:  1-7 Mild 8-19 Moderate 20-35 Severe  ED He does also have a history of erectile dysfunction. He was present prescribed 50 mg tablet of Viagra by his primary care physician which worked well.  Now taking generic sildenafil as needed.   PMH: Past Medical History:  Diagnosis Date  . GERD (gastroesophageal reflux disease)   . Hyperlipemia   . Hypertension   . Kidney stones 2018  . Sleep apnea   . Stomach ulcer     Surgical History: Past Surgical History:  Procedure Laterality Date  . EXTRACORPOREAL SHOCK WAVE  LITHOTRIPSY Right 06/25/2016   Procedure: EXTRACORPOREAL SHOCK WAVE LITHOTRIPSY (ESWL);  Surgeon: Joe ScotlandAshley Lindwood Mogel, MD;  Location: [facility] ORS;  Service: Urology;  Laterality: Right;  . KNEE SURGERY Left   . UPPER GI ENDOSCOPY  2008   stomach ulcer  . UVULOPALATOPHARYNGOPLASTY (UPPP)/TONSILLECTOMY/SEPTOPLASTY  2000    Home Medications:  Allergies as of 05/07/2017      Reactions   Nsaids Other (See Comments)   Hx of ulcer      Medication List        Accurate as of 05/07/17 11:59 PM. Always use your most recent med list.          amLODipine 5 MG tablet Commonly known as:  NORVASC Take 5 mg by mouth daily.   dicyclomine 20 MG tablet Commonly known as:  BENTYL Take 1 tablet (20 mg total) by mouth 2 (two) times daily.   finasteride 5 MG tablet Commonly known as:  PROSCAR TAKE 1  TABLET BY MOUTH EVERY DAY   nortriptyline 50 MG capsule Commonly known as:  PAMELOR   pravastatin 10 MG tablet Commonly known as:  PRAVACHOL Take 40 mg by mouth daily.   sildenafil 20 MG tablet Commonly known as:  REVATIO Take 1 tablet (20 mg total) by mouth as needed. Take 1-5 tabs as needed prior to intercourse       Allergies:  Allergies  Allergen Reactions  . Nsaids Other (See Comments)    Hx of ulcer    Family History: Family History  Problem Relation Age of Onset  . Heart attack Father   . Prostatitis Neg Hx   . Prostate cancer Neg Hx   . Kidney cancer Neg Hx   . Bladder Cancer Neg Hx     Social History:  reports that  has never smoked. he has never used smokeless tobacco. He reports that he drinks alcohol. He reports that he does not use drugs.  ROS: UROLOGY Frequent Urination?: No Hard to postpone urination?: No Burning/pain with urination?: No Get up at night to urinate?: No Leakage of urine?: No Urine stream starts and stops?: No Trouble starting stream?: No Do you have to strain to urinate?: No Blood in urine?: No Urinary tract infection?: No Sexually transmitted disease?: No Injury to kidneys or bladder?: No Painful intercourse?: No Weak stream?: No Erection problems?: No Penile pain?: No  Gastrointestinal Nausea?: No Vomiting?: No Indigestion/heartburn?: No Diarrhea?: No Constipation?: No  Constitutional Fever: No Night sweats?: No Weight loss?: No Fatigue?: No  Skin Skin rash/lesions?: No Itching?: No  Eyes Blurred vision?: No Double vision?: No  Ears/Nose/Throat Sore throat?: No Sinus problems?: No  Hematologic/Lymphatic Swollen glands?: No Easy bruising?: No  Cardiovascular Leg swelling?: No Chest pain?: No  Respiratory Cough?: No Shortness of breath?: No  Endocrine Excessive thirst?: No  Musculoskeletal Back pain?: No Joint pain?: No  Neurological Headaches?: No Dizziness?:  No  Psychologic Depression?: No Anxiety?: No  Physical Exam: BP (!) 147/85   Pulse 80   Ht 5\' 10"  (1.778 m)   Wt 264 lb (119.7 kg)   BMI 37.88 kg/m   Constitutional:  Alert and oriented, No acute distress.   HEENT: Joe Dunlap AT, moist mucus membranes.  Trachea midline, no masses. Cardiovascular: No clubbing, cyanosis, or edema. Respiratory: Normal respiratory effort, no increased work of breathing. GI: Abdomen is soft, nontender, nondistended, no abdominal masses GU: No CVA tenderness.  Skin: No rashes, bruises or suspicious lesions. Neurologic: Grossly intact, no focal deficits, moving all 4 extremities. Psychiatric: Appropriate, normal  mood and affect today.  Laboratory Data: Lab Results  Component Value Date   WBC 7.9 11/15/2014   HGB 16.1 11/15/2014   HCT 47.7 11/15/2014   MCV 88.4 11/15/2014   PLT 227 11/15/2014    Lab Results  Component Value Date   CREATININE 1.03 11/15/2014    Urinalysis n/a  Pertinent Imaging: CLINICAL DATA:  History of kidney stones.  No current complaints.  EXAM: ABDOMEN - 1 VIEW  COMPARISON:  07/10/2016  FINDINGS: There are numerous calculi projecting over each kidney, largest discrete stone from the lower pole the left kidney. These are essentially stable from the prior exam.  No convincing ureteral stone.  Normal bowel gas pattern.  Stable degenerative changes and mild dextroscoliosis of the lumbar spine.  IMPRESSION: 1. No acute findings.  No evidence of a ureteral stone. 2. Multiple bilateral intrarenal stones similar to the prior abdominal radiographs.   Electronically Signed   By: Amie Portland M.D.   On: 05/07/2017 14:52  KUB personally reviewed today and with the patient  Assessment & Plan:    1. Bilateral nephrolithiasis Multiple additional small nonobstructing bilateral stones up to 9 mm Currently asymptomatic Recommend  24 hour urine-   2. Urinary frequency Continue finasteride Does not wish  to continue Flomax, failed oxybutinin Increase to 50 mg mybetriq Reassess next visit Due for PSA/ DRE, will perform next visit  F/u 3 months to review 24 hour urine   Joe Scotland, MD  [facility] [phone]

## 2017-06-28 ENCOUNTER — Other Ambulatory Visit: Payer: Self-pay

## 2017-06-28 ENCOUNTER — Other Ambulatory Visit: Payer: BLUE CROSS/BLUE SHIELD

## 2017-06-28 MED ORDER — MIRABEGRON ER 50 MG PO TB24: 50.0000 mg | ORAL_TABLET | Freq: Every day | ORAL | 3 refills | Status: DC

## 2017-06-28 NOTE — Progress Notes (Signed)
Patient states samples are working well would like script sent to [facility], script sent

## 2017-07-02 ENCOUNTER — Other Ambulatory Visit: Payer: Self-pay | Admitting: Urology

## 2017-08-06 ENCOUNTER — Encounter: Payer: Self-pay | Admitting: Urology

## 2017-08-06 ENCOUNTER — Ambulatory Visit (INDEPENDENT_AMBULATORY_CARE_PROVIDER_SITE_OTHER): Payer: BLUE CROSS/BLUE SHIELD | Admitting: Urology

## 2017-08-06 VITALS — BP 147/79 | HR 83 | Ht 70.0 in | Wt 264.0 lb

## 2017-08-06 DIAGNOSIS — N200 Calculus of kidney: Secondary | ICD-10-CM | POA: Diagnosis not present

## 2017-08-06 DIAGNOSIS — N401 Enlarged prostate with lower urinary tract symptoms: Secondary | ICD-10-CM

## 2017-08-06 DIAGNOSIS — R350 Frequency of micturition: Secondary | ICD-10-CM

## 2017-08-06 MED ORDER — INDAPAMIDE 2.5 MG PO TABS: 2.5000 mg | ORAL_TABLET | Freq: Every day | ORAL | 11 refills | Status: DC

## 2017-08-06 NOTE — Patient Instructions (Signed)

## 2017-08-06 NOTE — Progress Notes (Signed)
We will have machine and there is only for  08/06/2017 3:49 PM   Kerri Asche [facility] 05/23/1960 [id number]  Referring provider: Marina Goodell, MD [address] [place], [place] [address]  Chief Complaint  Patient presents with  . Nephrolithiasis    HPI: 57 year old male who initially presented with multiple GU issues including urinary frequency, BPH with LUTS, microsocopic hematuria, and history of kidney stones today for routine follow up  Stones Pre-2018 s/p ESWL x 1 in remote past S/p sucessful right ESWL for a 6 mm proximal ureteral calculus on 2/18 KUB shows bilateral stones, up to 9 mm in the LLP  Stone composition shows calcium oxalate monohydrate 70%, calcium oxalate dihydrate 27%, calcium phosphate 3%  Returns today with 24-hour urine results. Urinary volume of 2.65 L/day.  He does have extreme hyperuricosuria with urinary uric acid level of 1.267 g/day.  Additionally, he has severe urinary calcium, 591 mg/day as well as a moderately elevated urinary oxalate level of 60 mg/day.  His urinary citrate is excellent 965 mg/day.  Calcium 8.9.  Uric acid level 7.1   Which is borderline elevated.  Microscopic hematuria He underwent CT urogram on 05/19/16 for further workup of microscopic hematuria to have an incidental 6 mm proximal nonobstructing calculus.  Cysto deferred due to presence of ureteral stone at time F/u UA after interval stone passage negative for blood   BPH with LUTS Currently on finasteride. He stopped taking Flomax for unclear reasons although states it wasn't helping him much.  Given samples of Mybetriq 25 mg without any improvement.  He continues to struggle with urinary frequency and urgency as his primary complaint.     He drinks primarily water. He does drink at least once a day and is previously tried cutting this out without improvement of the symptoms.  PVR previously 52 on 04/2016.  Most recent PSA on 05/30/2015 was 1.11.   Rectal exam  04/2016 40+ cc prostate, nontender, no nodules.   PMH: Past Medical History:  Diagnosis Date  . GERD (gastroesophageal reflux disease)   . Hyperlipemia   . Hypertension   . Kidney stones 2018  . Sleep apnea   . Stomach ulcer     Surgical History: Past Surgical History:  Procedure Laterality Date  . EXTRACORPOREAL SHOCK WAVE LITHOTRIPSY Right 06/25/2016   Procedure: EXTRACORPOREAL SHOCK WAVE LITHOTRIPSY (ESWL);  Surgeon: [name], MD;  Location: [facility] ORS;  Service: Urology;  Laterality: Right;  . KNEE SURGERY Left   . UPPER GI ENDOSCOPY  2008   stomach ulcer  . UVULOPALATOPHARYNGOPLASTY (UPPP)/TONSILLECTOMY/SEPTOPLASTY  2000    Home Medications:  Allergies as of 08/06/2017      Reactions   Nsaids Other (See Comments)   Hx of ulcer      Medication List        Accurate as of 08/06/17 11:59 PM. Always use your most recent med list.          AJOVY 225 MG/1.5ML Sosy Generic drug:  Fremanezumab-vfrm   amLODipine 5 MG tablet Commonly known as:  NORVASC Take 5 mg by mouth daily.   finasteride 5 MG tablet Commonly known as:  PROSCAR TAKE 1 TABLET BY MOUTH EVERY DAY   indapamide 2.5 MG tablet Commonly known as:  LOZOL Take 1 tablet (2.5 mg total) by mouth daily.   mirabegron ER 50 MG Tb24 tablet Commonly known as:  MYRBETRIQ Take 1 tablet (50 mg total) by mouth daily.   nortriptyline 50 MG capsule Commonly known as:  PAMELOR   pravastatin 10 MG tablet Commonly known as:  PRAVACHOL Take 40 mg by mouth daily.   sildenafil 20 MG tablet Commonly known as:  REVATIO Take 1 tablet (20 mg total) by mouth as needed. Take 1-5 tabs as needed prior to intercourse       Allergies:  Allergies  Allergen Reactions  . Nsaids Other (See Comments)    Hx of ulcer    Family History: Family History  Problem Relation Age of Onset  . Heart attack Father   . Prostatitis Neg Hx   . Prostate cancer Neg Hx   . Kidney cancer Neg Hx   . Bladder Cancer Neg Hx      Social History:  reports that he has never smoked. He has never used smokeless tobacco. He reports that he drinks alcohol. He reports that he does not use drugs.  ROS: UROLOGY Frequent Urination?: Yes Hard to postpone urination?: Yes Burning/pain with urination?: No Get up at night to urinate?: Yes Leakage of urine?: Yes Urine stream starts and stops?: No Trouble starting stream?: No Do you have to strain to urinate?: No Blood in urine?: No Urinary tract infection?: No Sexually transmitted disease?: No Injury to kidneys or bladder?: No Painful intercourse?: No Weak stream?: No Erection problems?: No Penile pain?: No  Gastrointestinal Nausea?: No Vomiting?: No Indigestion/heartburn?: No Diarrhea?: No Constipation?: No  Constitutional Fever: No Night sweats?: No Weight loss?: No Fatigue?: No  Skin Skin rash/lesions?: No Itching?: No  Eyes Blurred vision?: No Double vision?: No  Ears/Nose/Throat Sore throat?: No Sinus problems?: No  Hematologic/Lymphatic Swollen glands?: No Easy bruising?: No  Cardiovascular Leg swelling?: No Chest pain?: No  Respiratory Cough?: No Shortness of breath?: No  Endocrine Excessive thirst?: No  Musculoskeletal Back pain?: No Joint pain?: No  Neurological Headaches?: No Dizziness?: No  Psychologic Depression?: No Anxiety?: No  Physical Exam: BP (!) 147/79 (BP Location: Left Arm, Patient Position: Sitting, Cuff Size: Large)   Pulse 83   Ht 5\' 10"  (1.778 m)   Wt 264 lb (119.7 kg)   SpO2 99%   BMI 37.88 kg/m   Constitutional:  Alert and oriented, No acute distress. HEENT: Gisela AT, moist mucus membranes.  Trachea midline, no masses. Cardiovascular: No clubbing, cyanosis, or edema. Respiratory: Normal respiratory effort, no increased work of breathing. Skin: No rashes, bruises or suspicious lesions. Neurologic: Grossly intact, no focal deficits, moving all 4 extremities. Psychiatric: Normal mood and  affect.  Pertinent Imaging:  Assessment & Plan:    1. Recurrent nephrolithiasis Personal history of current nephrolithiasis 24-hour urine metabolic evaluation reviewed Primary metabolic derangements to include hyperuricosuria (does not make uric acid stones), hyperoxaluria, and hypercalciuria Adequate urinary citrate as well as urinary volume We discussed treatment of initiation of indapamide 2.5 mg to see if this brings down his urinary calcium to more reasonable levels He was also counseled on a low purine diet today as he may have a small amount of uric acid is nidus for his stones Recommend repeat 24-hour urine evaluation after starting this medication to see if there is any significant improvement He is agreeable this plan  2. Urinary frequency Possibly related to p.o. intake and underlying OAB Continue Myrbetriq 50 mg  3. Benign prostatic hyperplasia with lower urinary tract symptoms, symptom details unspecified Personal history of irritative voiding symptoms currently on finasteride sign  plan for IPSS/PSA at next visit - PSA; Future    Return in about 3 months (around 11/06/2017) for repeat 24 urine,  PSA prior (drawn at time of litholink), IPSS.  [name], MD  [facility] [address], [address] [place], [place] [address] [phone]   I spent 25 min with this patient of which greater than 50% was spent in counseling and coordination of care with the patient.

## 2017-10-03 ENCOUNTER — Encounter: Payer: Self-pay | Admitting: Emergency Medicine

## 2017-10-03 ENCOUNTER — Emergency Department: Payer: BLUE CROSS/BLUE SHIELD

## 2017-10-03 ENCOUNTER — Emergency Department
Admission: EM | Admit: 2017-10-03 | Discharge: 2017-10-03 | Disposition: A | Discharge status: Home / Self Care | Payer: BLUE CROSS/BLUE SHIELD | Attending: Emergency Medicine | Admitting: Emergency Medicine

## 2017-10-03 ENCOUNTER — Other Ambulatory Visit: Payer: Self-pay

## 2017-10-03 DIAGNOSIS — I10 Essential (primary) hypertension: Secondary | ICD-10-CM | POA: Insufficient documentation

## 2017-10-03 DIAGNOSIS — R0789 Other chest pain: Secondary | ICD-10-CM | POA: Insufficient documentation

## 2017-10-03 DIAGNOSIS — R079 Chest pain, unspecified: Secondary | ICD-10-CM

## 2017-10-03 LAB — BASIC METABOLIC PANEL
ANION GAP: 9 (ref 5–15)
BUN: 13 mg/dL (ref 6–20)
CHLORIDE: 108 mmol/L (ref 98–111)
CO2: 23 mmol/L (ref 22–32)
CREATININE: 1.07 mg/dL (ref 0.61–1.24)
Calcium: 9.3 mg/dL (ref 8.9–10.3)
GFR calc non Af Amer: >60 mL/min (ref >60)
Glucose, Bld: 110 mg/dL — ABNORMAL HIGH (ref 70–99)
POTASSIUM: 4 mmol/L (ref 3.5–5.1)
SODIUM: 140 mmol/L (ref 135–145)

## 2017-10-03 LAB — CBC
HCT: 47.2 % (ref 40.0–52.0)
HEMOGLOBIN: 16.6 g/dL (ref 13.0–18.0)
MCH: 31.6 pg (ref 26.0–34.0)
MCHC: 35.2 g/dL (ref 32.0–36.0)
MCV: 89.8 fL (ref 80.0–100.0)
PLATELETS: 257 10*3/uL (ref 150–440)
RBC: 5.26 MIL/uL (ref 4.40–5.90)
RDW: 13.6 % (ref 11.5–14.5)
WBC: 9.6 10*3/uL (ref 3.8–10.6)

## 2017-10-03 LAB — TROPONIN I: Troponin I: <0.03 ng/mL (ref <0.03)

## 2017-10-03 MED ORDER — ASPIRIN EC 325 MG PO TBEC
DELAYED_RELEASE_TABLET | ORAL | Status: AC
  Filled 2017-10-03: qty 1

## 2017-10-03 MED ORDER — ASPIRIN EC 325 MG PO TBEC
325.0000 mg | DELAYED_RELEASE_TABLET | Freq: Once | ORAL | Status: AC
  Administered 2017-10-03: 325 mg via ORAL

## 2017-10-03 MED ORDER — ASPIRIN 81 MG PO TBEC: 81.0000 mg | DELAYED_RELEASE_TABLET | Freq: Every day | ORAL | 12 refills | Status: DC

## 2017-10-03 MED ORDER — LORAZEPAM 1 MG PO TABS: 1.0000 mg | ORAL_TABLET | Freq: Two times a day (BID) | ORAL | 0 refills | Status: DC

## 2017-10-03 NOTE — ED Provider Notes (Signed)
[facility] Emergency Department Provider Note       Time seen: ----------------------------------------- 7:29 PM on 10/03/2017 -----------------------------------------   I have reviewed the triage vital signs and the nursing notes.  HISTORY   Chief Complaint Chest Pain    HPI Joe Dunlap is a 57 y.o. male with a history of GERD, hyperlipidemia, hypertension, kidney stones, sleep apnea who presents to the ED for chest pain on and off for the past month.  Patient states he woke up with chest pain this morning.  Patient states he is having some shortness of breath but not that much.  Patient states the pain radiates into his left arm and left leg at times.  He states he does have a stressful job, denies any recent illness or change in his medications.  States his father died at age 57 from a heart attack.  He does have outpatient appointment scheduled with cardiology.  Past Medical History:  Diagnosis Date  . GERD (gastroesophageal reflux disease)   . Hyperlipemia   . Hypertension   . Kidney stones 2018  . Sleep apnea   . Stomach ulcer     Patient Active Problem List   Diagnosis Date Noted  . Chronic tension-type headache, not intractable 03/04/2017  . Impaired glucose tolerance 01/09/2014  . Shingles 10/19/2013  . Right hip pain 09/20/2013  . DDD (degenerative disc disease), lumbar 09/07/2013  . Labral tear of hip, degenerative 09/07/2013  . Lumbar radiculitis 09/07/2013  . Elevated fasting blood sugar 08/03/2013  . GERD (gastroesophageal reflux disease) 08/03/2013  . Hyperlipidemia, unspecified 08/03/2013  . Hypertension, essential, benign 08/03/2013  . Low back pain 08/03/2013  . Abdominal pain 07/31/2013    Past Surgical History:  Procedure Laterality Date  . EXTRACORPOREAL SHOCK WAVE LITHOTRIPSY Right 06/25/2016   Procedure: EXTRACORPOREAL SHOCK WAVE LITHOTRIPSY (ESWL);  Surgeon: Vanna ScotlandAshley Brandon, MD;  Location: [facility] ORS;  Service:  Urology;  Laterality: Right;  . KNEE SURGERY Left   . UPPER GI ENDOSCOPY  2008   stomach ulcer  . UVULOPALATOPHARYNGOPLASTY (UPPP)/TONSILLECTOMY/SEPTOPLASTY  2000    Allergies Nsaids  Social History Social History   Tobacco Use  . Smoking status: Never Smoker  . Smokeless tobacco: Never Used  Substance Use Topics  . Alcohol use: Yes  . Drug use: No   Review of Systems Constitutional: Negative for fever. Cardiovascular: Positive for chest pain Respiratory: Positive for occasional shortness of breath Gastrointestinal: Negative for abdominal pain, vomiting and diarrhea. Musculoskeletal: Negative for back pain. Skin: Negative for rash. Neurological: Negative for headaches, focal weakness or numbness.  All systems negative/normal/unremarkable except as stated in the HPI  ____________________________________________   PHYSICAL EXAM:  VITAL SIGNS: ED Triage Vitals  Enc Vitals Group     BP 10/03/17 1756 134/80     Pulse Rate 10/03/17 1756 (!) 110     Resp 10/03/17 1756 16     Temp 10/03/17 1756 98.1 F (36.7 C)     Temp Source 10/03/17 1756 Oral     SpO2 10/03/17 1756 97 %     Weight 10/03/17 1754 265 lb (120.2 kg)     Height 10/03/17 1754 5\' 10"  (1.778 m)     Head Circumference --      Peak Flow --      Pain Score 10/03/17 1754 2     Pain Loc --      Pain Edu? --      Excl. in GC? --    Constitutional: Alert and oriented.  Well appearing and in no distress. Eyes: Conjunctivae are normal. Normal extraocular movements. ENT   Head: Normocephalic and atraumatic.   Nose: No congestion/rhinnorhea.   Mouth/Throat: Mucous membranes are moist.   Neck: No stridor. Cardiovascular: Normal rate, regular rhythm. No murmurs, rubs, or gallops. Respiratory: Normal respiratory effort without tachypnea nor retractions. Breath sounds are clear and equal bilaterally. No wheezes/rales/rhonchi. Gastrointestinal: Soft and nontender. Normal bowel sounds Musculoskeletal:  Nontender with normal range of motion in extremities. No lower extremity tenderness nor edema. Neurologic:  Normal speech and language. No gross focal neurologic deficits are appreciated.  Skin:  Skin is warm, dry and intact. No rash noted. Psychiatric: Mood and affect are normal. Speech and behavior are normal.  ____________________________________________  EKG: Interpreted by me.  Sinus tachycardia with a rate of 113 bpm, left posterior fascicular block, normal QRS, normal QT  Feet EKG interpreted by me, sinus rhythm the rate of 99 bpm, right axis deviation, normal QRS, normal QT. ____________________________________________  ED COURSE:  As part of my medical decision making, I reviewed the following data within the electronic MEDICAL RECORD NUMBER History obtained from family if available, nursing notes, old chart and ekg, as well as notes from prior ED visits. Patient presented for chest pain on and off for a month, we will assess with labs and imaging as indicated at this time.   Procedures ____________________________________________   LABS (pertinent positives/negatives)  Labs Reviewed  BASIC METABOLIC PANEL - Abnormal; Notable for the following components:      Result Value   Glucose, Bld 110 (*)    All other components within normal limits  CBC  TROPONIN I  TROPONIN I    RADIOLOGY Images were viewed by me  Chest x-ray is normal  ____________________________________________  DIFFERENTIAL DIAGNOSIS   Unstable angina, musculoskeletal pain, GERD, anxiety  FINAL ASSESSMENT AND PLAN  Chest pain   Plan: The patient had presented for chest pain. Patient's labs were unremarkable including repeat troponin. Patient's imaging was also negative.  He will be placed on enteric-coated baby aspirin and he will be encouraged to continue outpatient follow-up with cardiology as directed.   Ulice Dash, MD   Note: This note was generated in part or whole with voice  recognition software. Voice recognition is usually quite accurate but there are transcription errors that can and very often do occur. I apologize for any typographical errors that were not detected and corrected.     Emily Filbert, MD 10/03/17 2028

## 2017-10-03 NOTE — ED Triage Notes (Signed)
Pt to ED via POV c/o chest pain, off and on x 1 month. Pt states that he woke up with chest pain this morning. Pt states that he is having some shortness of breath but not much. Pt states that the pain radiates into his left arm and left leg. Pt is in NAD at this time.

## 2017-10-03 NOTE — ED Notes (Signed)
No peripheral IV placed this visit.   Discharge instructions reviewed with patient. Questions fielded by this RN. Patient verbalizes understanding of instructions. Patient discharged home in stable condition per williams. No acute distress noted at time of discharge.   

## 2017-10-03 NOTE — ED Notes (Addendum)
Pt report central CP for the last 30 days without radiation or associated s/sx, pt reports family hx of cardiac and numbness in left: arm, jaw, and leg

## 2017-10-22 IMAGING — CT CT ABD-PEL WO/W CM
3 of 12 series · 12 of 46 positions shown, 18 images · IV contrast (iopamidol)
Comparison: 08/07/2013

CLINICAL DATA: Microscopic hematuria. Urinary frequency.
Nephrolithiasis.

EXAM:
CT ABDOMEN AND PELVIS WITHOUT AND WITH CONTRAST
TECHNIQUE: Multidetector CT imaging of the abdomen and pelvis was performed
following the standard protocol before and following the bolus
administration of intravenous contrast.
CONTRAST:  125mL [id number] IOPAMIDOL ([id number]) INJECTION 61%

[Series 5: hematuria < 45 with 100s · axial · 0.75mm/px · z∈[-800,-590]mm · 4 of 98 slices shown]
[im 14/98  soft-tissue]
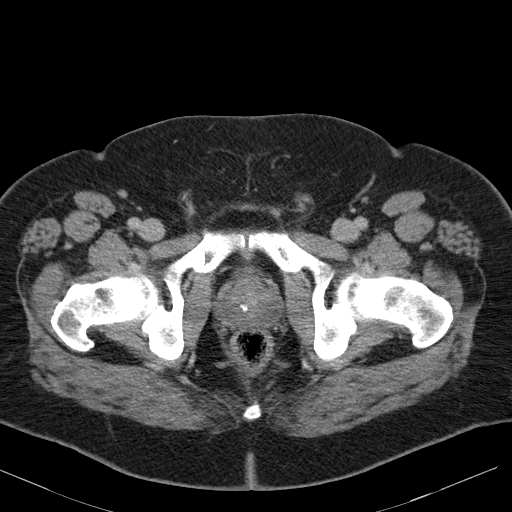
[im 28/98  soft-tissue]
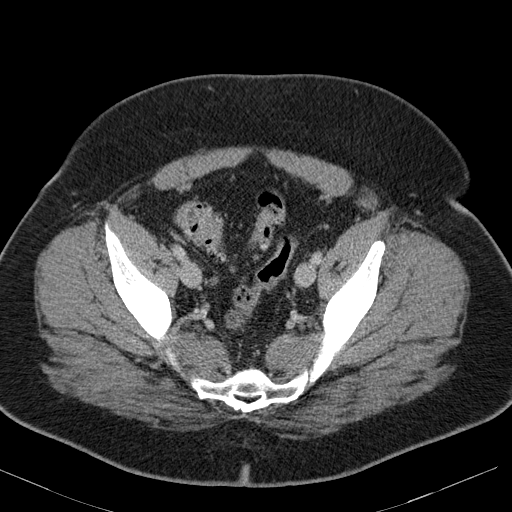
[im 42/98  soft-tissue]
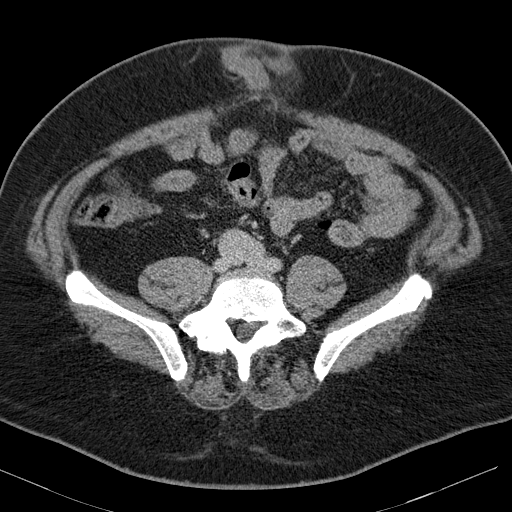
[im 56/98  soft-tissue]
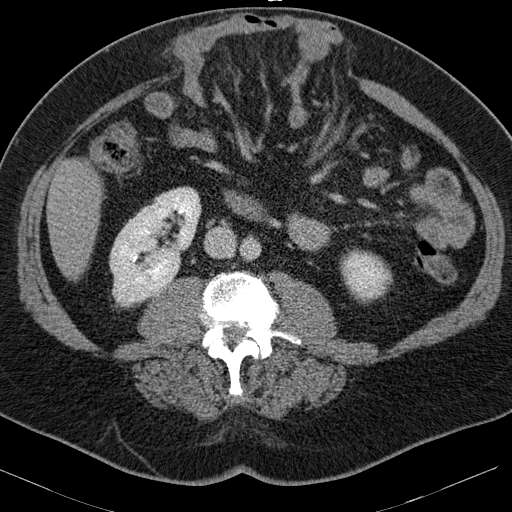

[Series 9: hematuria > 45 10 min delay · axial · delayed · 0.83mm/px · z∈[-824,-449]mm · 6 of 106 slices shown, 11 images]
[im 16/106  soft-tissue]
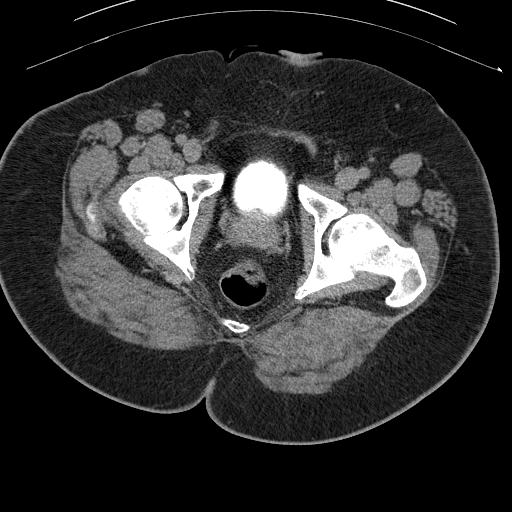
[im 16/106  bone]
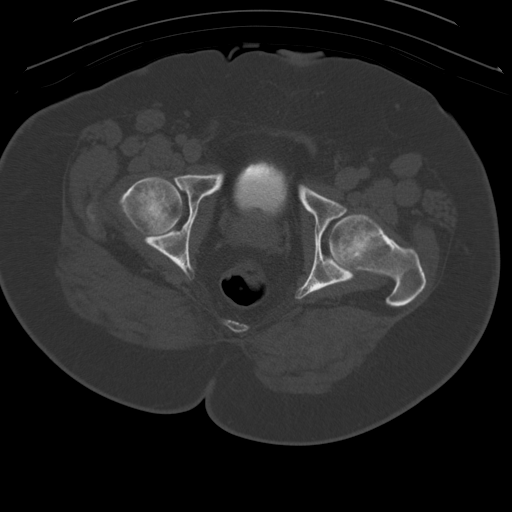
[im 31/106  soft-tissue]
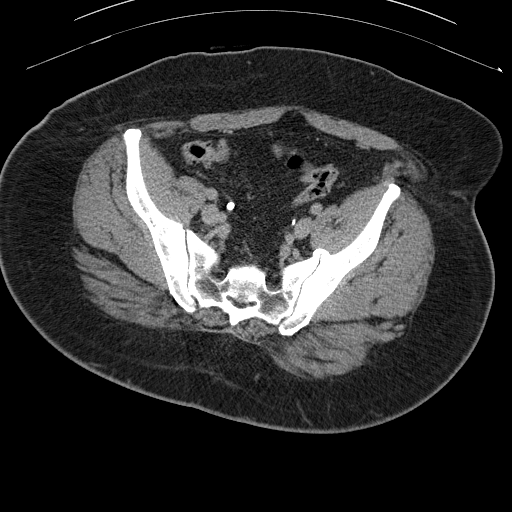
[im 46/106  soft-tissue]
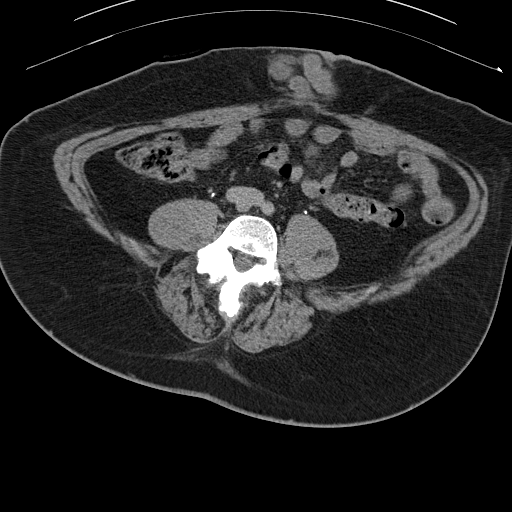
[im 46/106  lung]
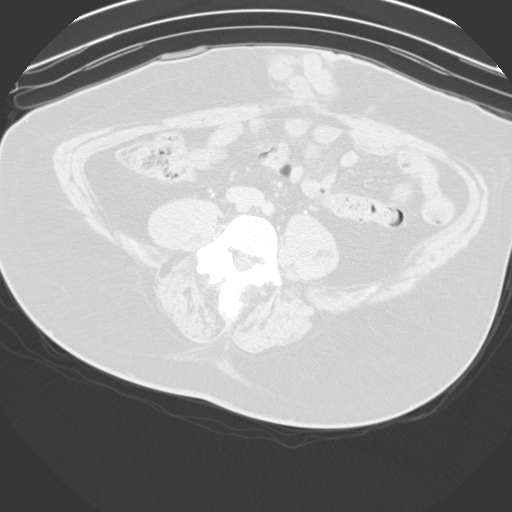
[im 61/106  soft-tissue]
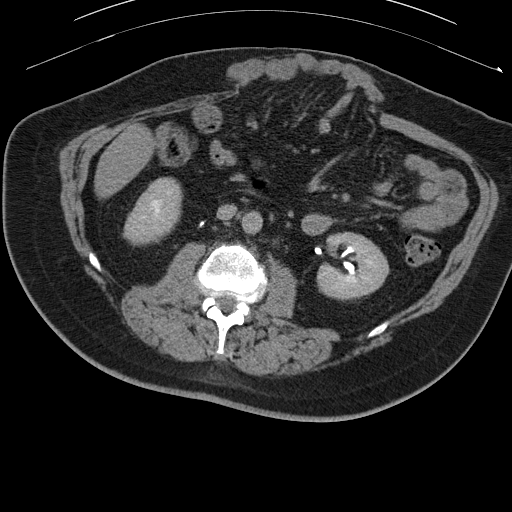
[im 61/106  lung]
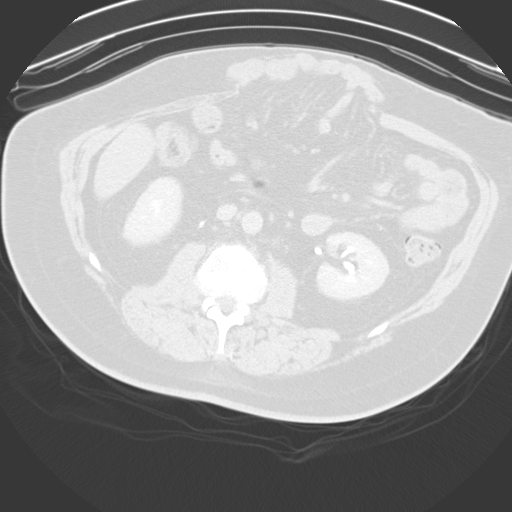
[im 76/106  soft-tissue]
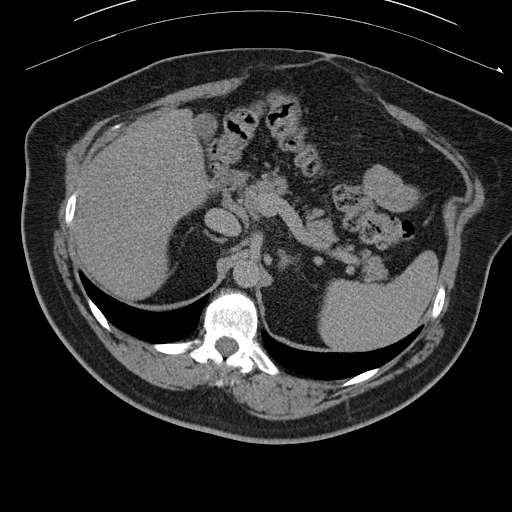
[im 76/106  lung]
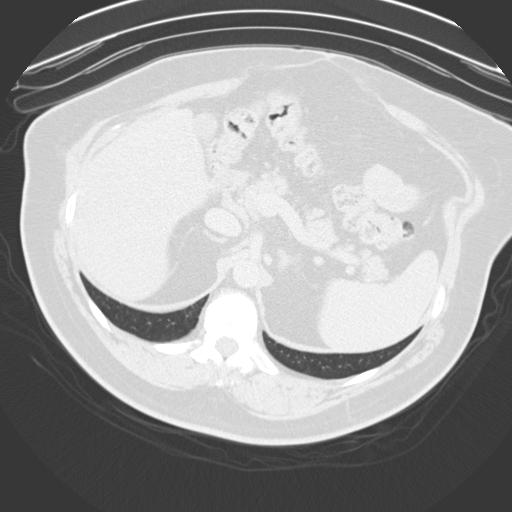
[im 91/106  soft-tissue]
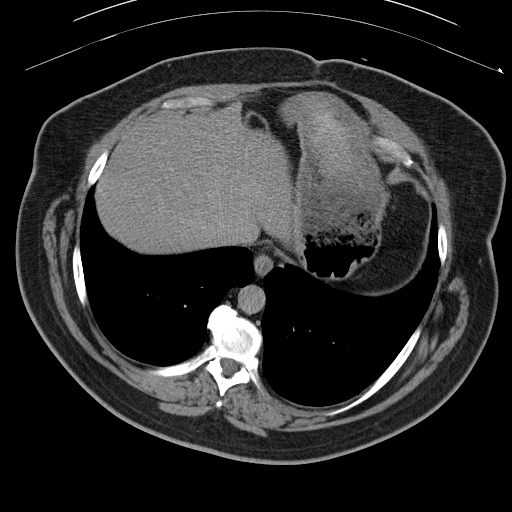
[im 91/106  lung]
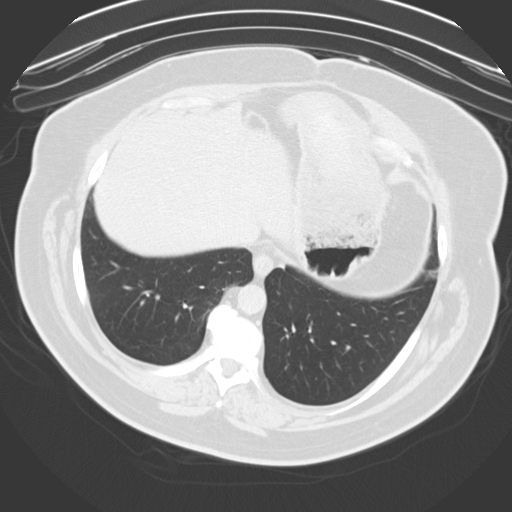

[Series 602: coronal wo · coronal · 0.95mm/px · 2 of 151 slices shown, 3 images]
[im 51/151  soft-tissue]
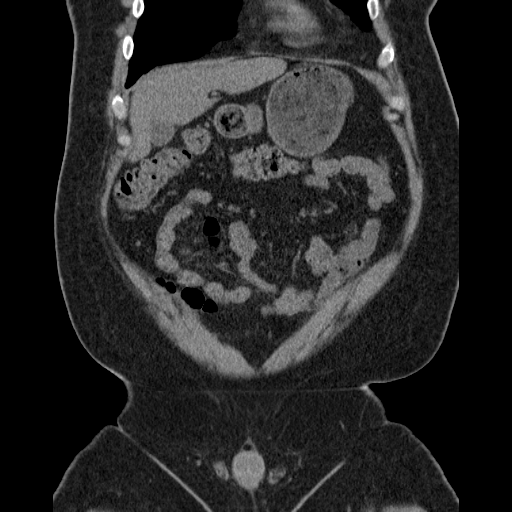
[im 51/151  bone]
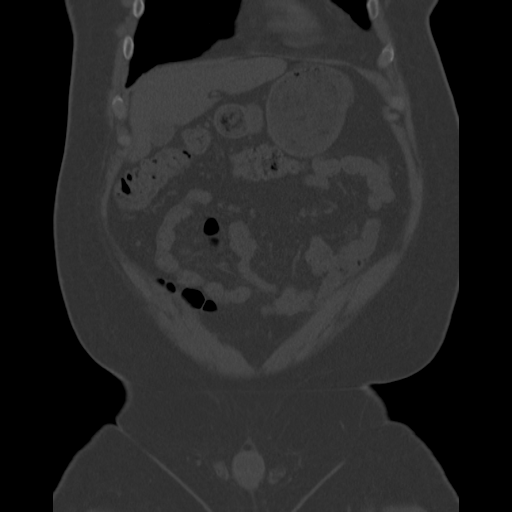
[im 101/151  soft-tissue]
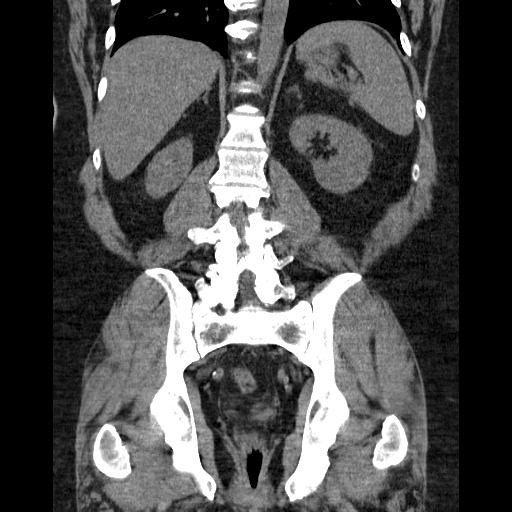

[12 of 46 positions shown; findings below may reference images not displayed]

FINDINGS: Lower Chest: No acute findings. Stable 6 mm pulmonary nodule in
anterior right lower lobe, consistent with benign etiology.

Hepatobiliary:  No masses identified. Gallbladder is unremarkable.

Pancreas:  No mass or inflammatory changes.

Spleen: Within normal limits in size and appearance.

Adrenals/Urinary Tract: No adrenal masses identified. Several small
less than 5 mm intrarenal calculi are seen bilaterally. A new 6 mm
calculus is seen in the proximal right ureter on image 47/2, however
there is no evidence hydronephrosis. No evidence of left ureteral
calculi or dilatation. No calculi visualized within the urinary
bladder.

Tiny sub-cm left renal cyst remains stable. No complex cystic or
solid renal masses are identified. No masses seen involving the
ureters or bladder.

Stomach/Bowel: No evidence of obstruction, inflammatory process or
abnormal fluid collections. Sigmoid diverticulosis is demonstrated,
without evidence diverticulitis. A large midline epigastric ventral
hernia is seen containing multiple small bowel loops. No evidence of
bowel obstruction or ischemia.

Vascular/Lymphatic: No pathologically enlarged lymph nodes. No
abdominal aortic aneurysm. Aortic atherosclerosis.

Reproductive:  No mass or other significant abnormality.

Other:  None.

Musculoskeletal:  No suspicious bone lesions identified.
IMPRESSION: 6 mm proximal right ureteral calculus, without evidence of
hydronephrosis. Tiny nonobstructing bilateral intrarenal calculi
also seen.

No radiographic evidence of urinary tract neoplasm.

Colonic diverticulosis. No radiographic evidence of diverticulitis.

Stable large ventral hernia containing small bowel.

Aortic atherosclerosis.

## 2017-11-26 ENCOUNTER — Ambulatory Visit: Payer: BLUE CROSS/BLUE SHIELD | Admitting: Urology

## 2017-12-07 ENCOUNTER — Other Ambulatory Visit: Payer: Self-pay | Admitting: Urology

## 2017-12-09 ENCOUNTER — Telehealth: Payer: Self-pay | Admitting: Urology

## 2017-12-09 NOTE — Telephone Encounter (Signed)
Patient is requesting a refill on his Myrbetriq, but there is an interaction with his Pamelor that can result in heart arrhythmia.   I would check with his cardiologist prior to refilling the Myrbetriq.

## 2017-12-10 NOTE — Telephone Encounter (Signed)
A clearance letter was sent to his cardiologist.

## 2017-12-13 IMAGING — CR DG ABDOMEN 1V
3 series · 3 of 3 positions shown · non-contrast
Comparison: June 25, 2016

CLINICAL DATA: Nephrolithiasis

EXAM:
ABDOMEN - 1 VIEW

[abdomen kub (1 of 3)]
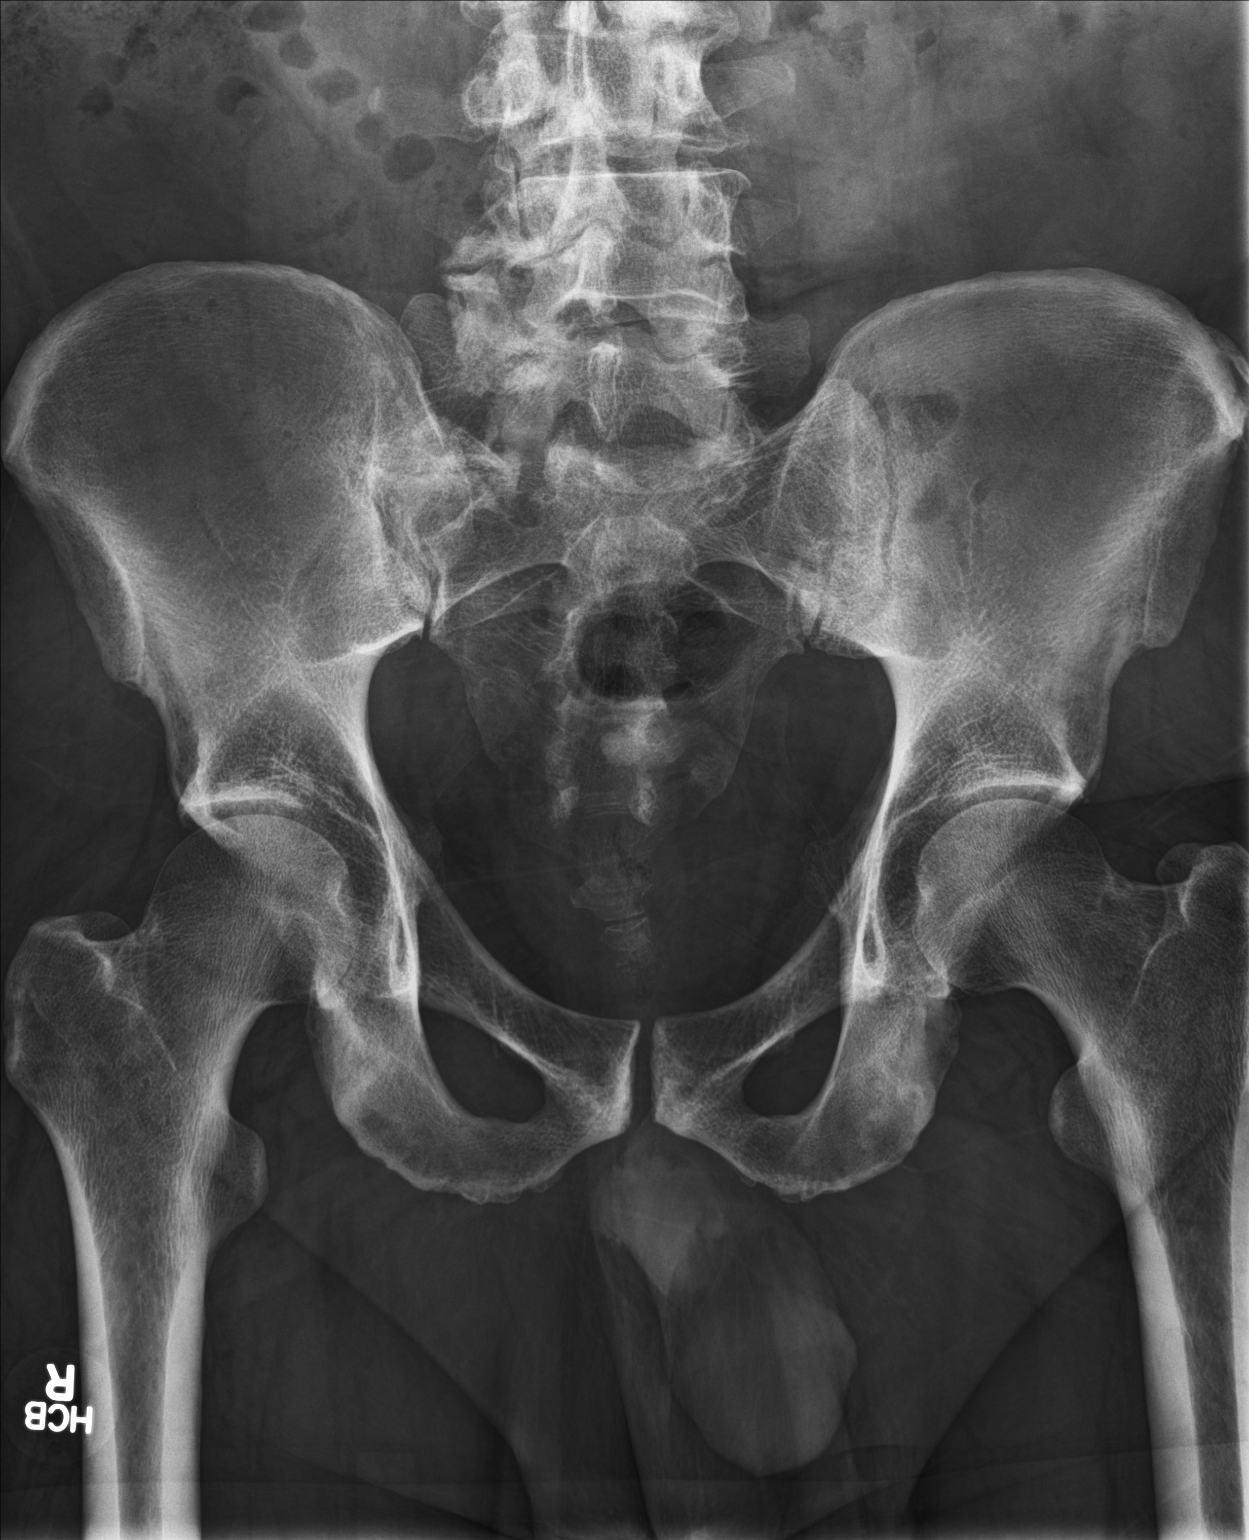

[abdomen kub (2 of 3)]
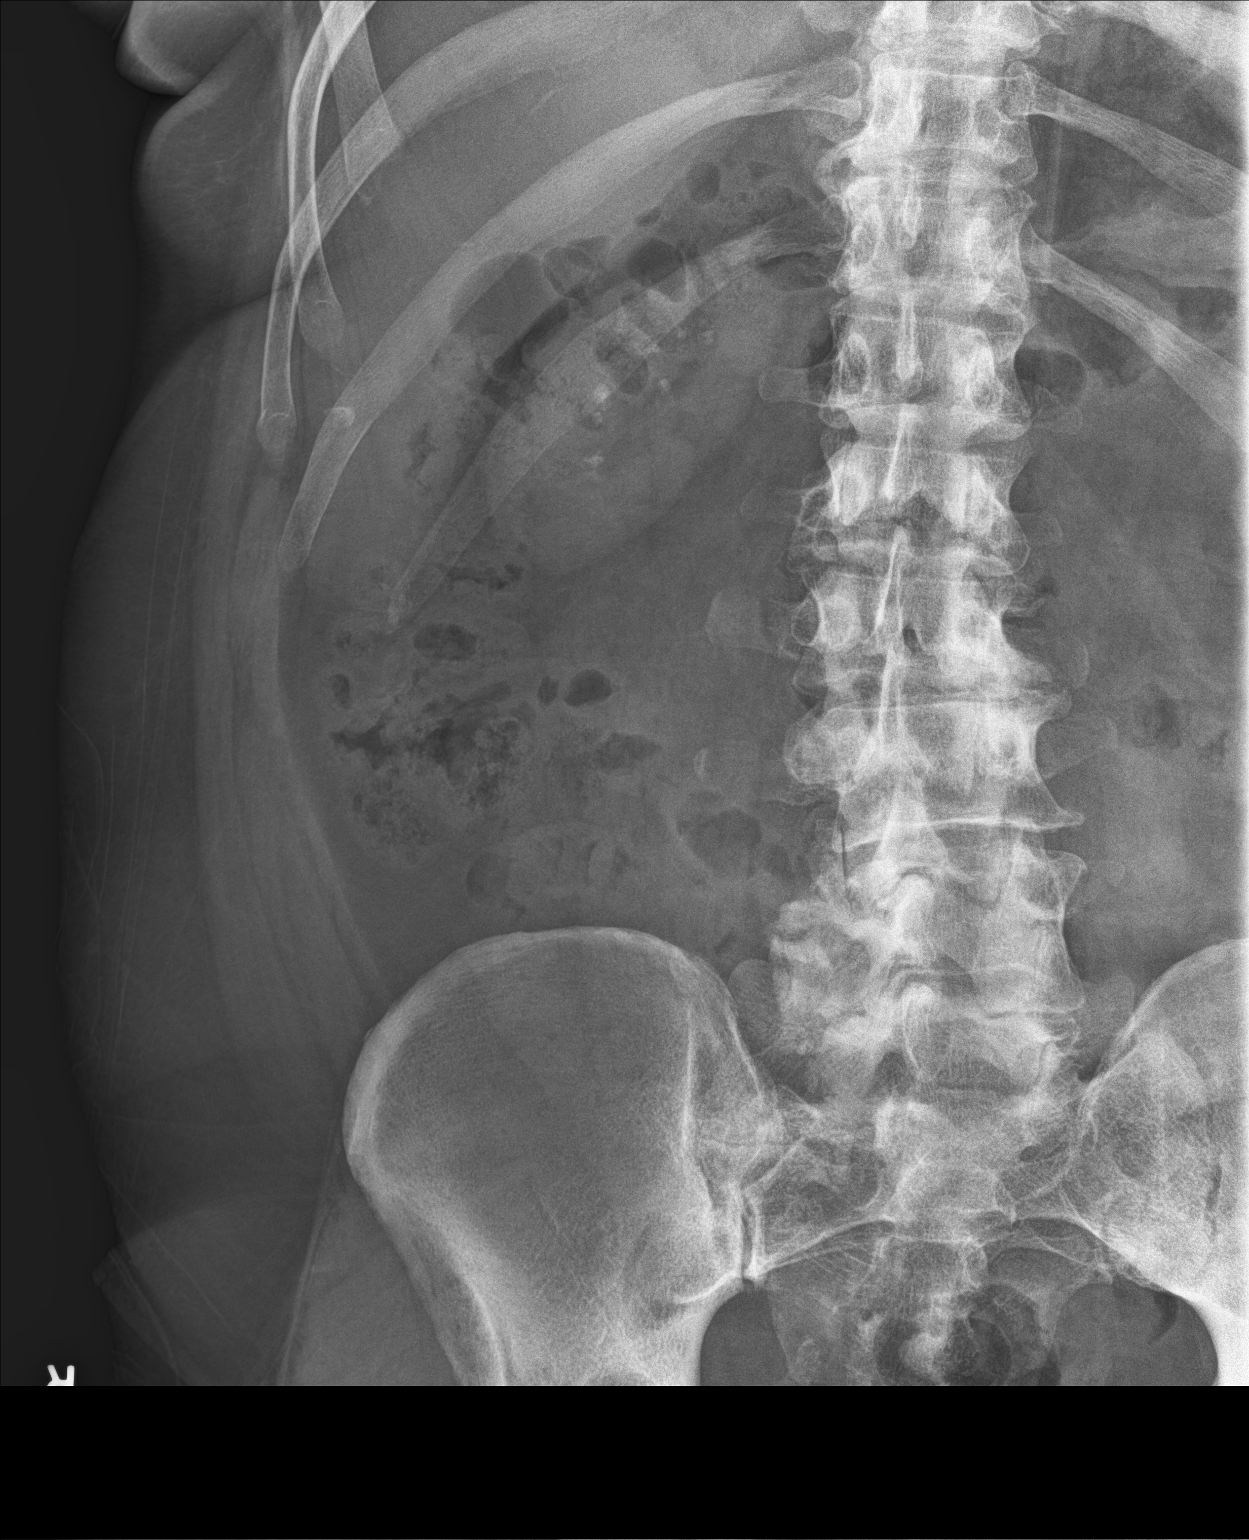

[abdomen kub (3 of 3)]
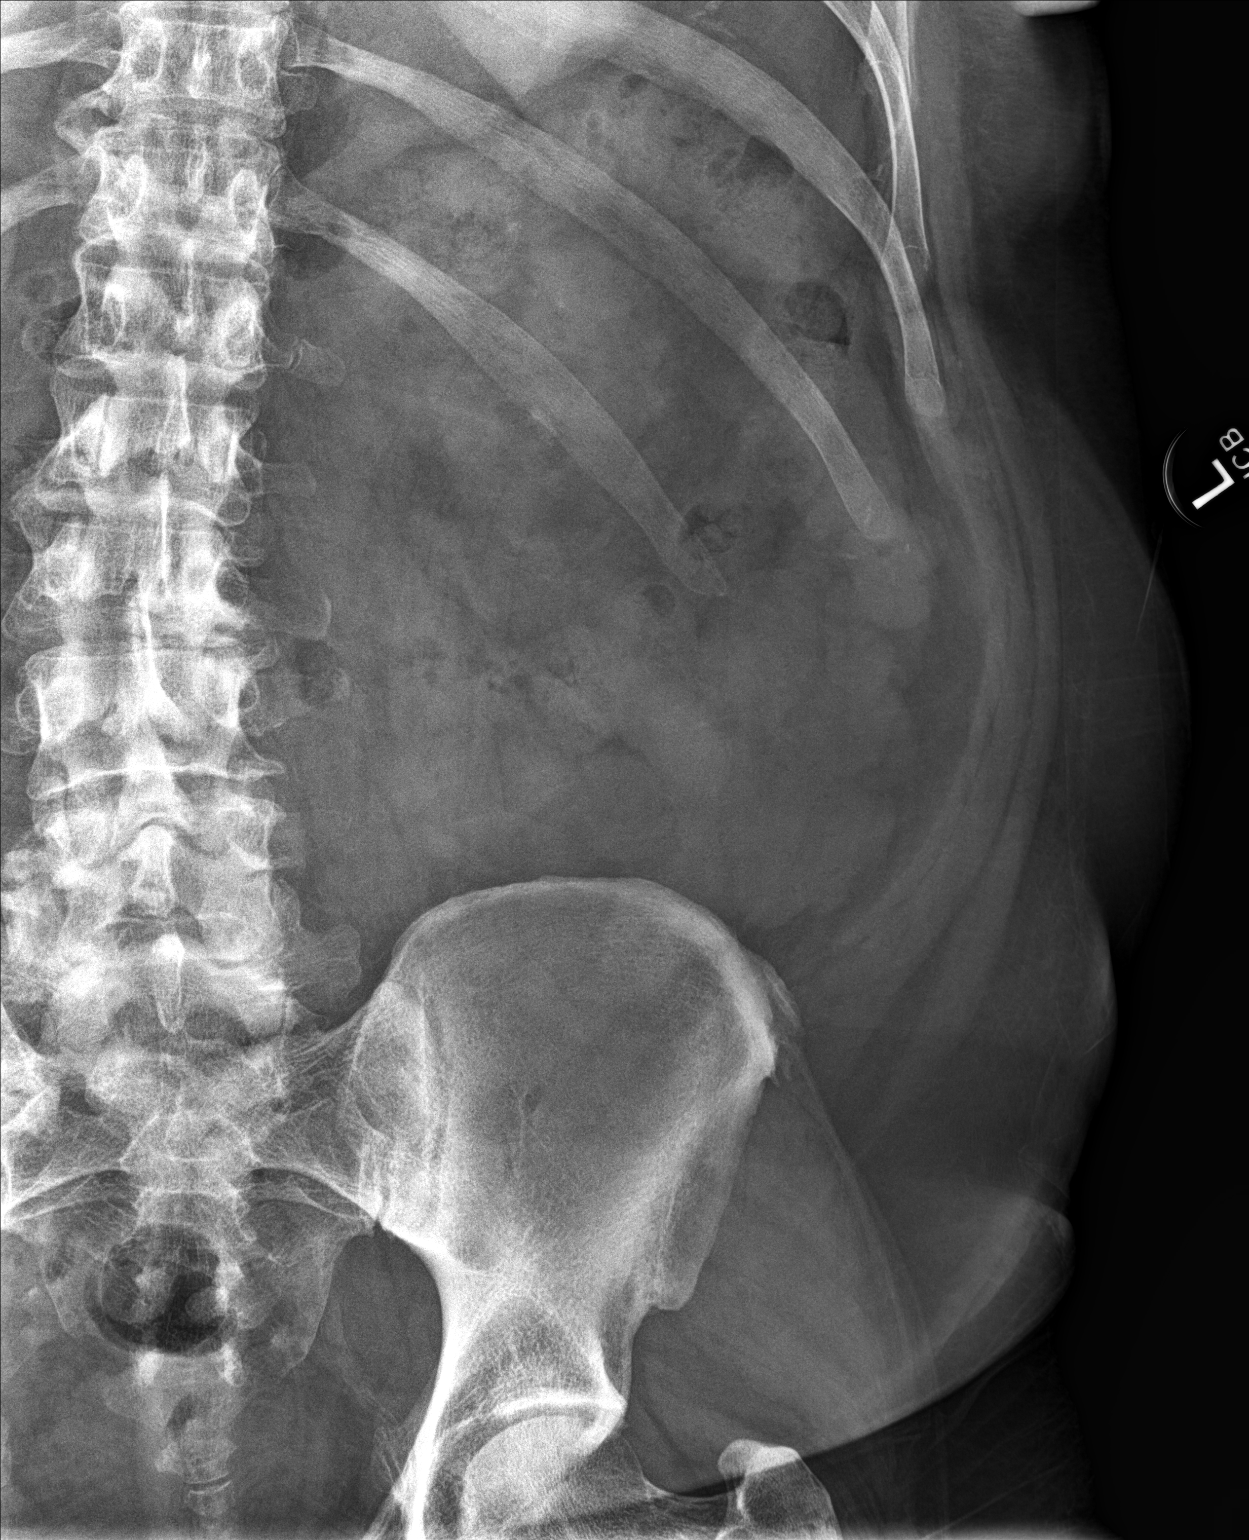

[3 of 3 positions shown; findings below may reference images not displayed]

FINDINGS: There are multiple calculi throughout the right kidney ranging in
size from as small as 1 mm to as large as 4 mm, unchanged. There are
calculi in the left kidney ranging in size from as small as 1 mm to
as large as 3 9 mm. The 9 mm calculus is present in the lower pole
left kidney region, stable. The previously noted calcification to
the right of L4-5 is no longer apparent. No new calcifications are
evident.

:
There is moderate stool in the colon. There is no bowel dilatation
or air-fluid level to suggest bowel obstruction. No free air.
IMPRESSION: Stable intrarenal calculi bilaterally. No abnormal extrarenal
calculi evident. Bowel gas pattern unremarkable.

## 2017-12-15 ENCOUNTER — Other Ambulatory Visit: Payer: Self-pay | Admitting: Family Medicine

## 2017-12-15 MED ORDER — MIRABEGRON ER 50 MG PO TB24: 50.0000 mg | ORAL_TABLET | Freq: Every day | ORAL | 6 refills | Status: DC

## 2017-12-23 NOTE — Telephone Encounter (Signed)
Patient is on Pamelor and Myrbetriq can cause prolongation of the QT interval when taken in combination with the Pamelor.  As patient had been recently evaluated for chest pain through cardiology, I would like there opinion regarding whether or not it would be safe to take therapy Myrbetriq in conjunction with the Pamelor.

## 2017-12-23 NOTE — Telephone Encounter (Signed)
Clearance was faxed to Dr. America Brown office indicating "patient wants to try Myrbetriq - need clearance as he is on Pamelor"  Dr. America Brown office sent it back stating " Please explain further.  These are not cardiac meds"  Please advise.

## 2017-12-27 NOTE — Telephone Encounter (Signed)
I have faxed this note again including the reasoning for requesting cardiac clearance.

## 2018-02-04 ENCOUNTER — Ambulatory Visit: Payer: BLUE CROSS/BLUE SHIELD | Admitting: Urology

## 2018-02-10 ENCOUNTER — Encounter: Payer: Self-pay | Admitting: *Deleted

## 2018-02-11 ENCOUNTER — Encounter: Payer: Self-pay | Admitting: *Deleted

## 2018-02-11 ENCOUNTER — Ambulatory Visit: Payer: BLUE CROSS/BLUE SHIELD | Admitting: Certified Registered Nurse Anesthetist

## 2018-02-11 ENCOUNTER — Encounter
Admission: RE | Disposition: A | Discharge status: Home / Self Care | Payer: Self-pay | Source: Home / Self Care | Attending: Gastroenterology

## 2018-02-11 ENCOUNTER — Ambulatory Visit
Admission: RE | Admit: 2018-02-11 | Discharge: 2018-02-11 | Disposition: A | Discharge status: Home / Self Care | Payer: BLUE CROSS/BLUE SHIELD | Attending: Gastroenterology | Admitting: Gastroenterology

## 2018-02-11 DIAGNOSIS — Z86010 Personal history of colonic polyps: Secondary | ICD-10-CM | POA: Insufficient documentation

## 2018-02-11 DIAGNOSIS — K219 Gastro-esophageal reflux disease without esophagitis: Secondary | ICD-10-CM | POA: Insufficient documentation

## 2018-02-11 DIAGNOSIS — Z8719 Personal history of other diseases of the digestive system: Secondary | ICD-10-CM | POA: Insufficient documentation

## 2018-02-11 DIAGNOSIS — Z7982 Long term (current) use of aspirin: Secondary | ICD-10-CM | POA: Diagnosis not present

## 2018-02-11 DIAGNOSIS — G4730 Sleep apnea, unspecified: Secondary | ICD-10-CM | POA: Diagnosis not present

## 2018-02-11 DIAGNOSIS — Z79899 Other long term (current) drug therapy: Secondary | ICD-10-CM | POA: Insufficient documentation

## 2018-02-11 DIAGNOSIS — Z87442 Personal history of urinary calculi: Secondary | ICD-10-CM | POA: Insufficient documentation

## 2018-02-11 DIAGNOSIS — K5730 Diverticulosis of large intestine without perforation or abscess without bleeding: Secondary | ICD-10-CM | POA: Insufficient documentation

## 2018-02-11 DIAGNOSIS — E785 Hyperlipidemia, unspecified: Secondary | ICD-10-CM | POA: Diagnosis not present

## 2018-02-11 DIAGNOSIS — M5136 Other intervertebral disc degeneration, lumbar region: Secondary | ICD-10-CM | POA: Diagnosis not present

## 2018-02-11 DIAGNOSIS — I10 Essential (primary) hypertension: Secondary | ICD-10-CM | POA: Diagnosis not present

## 2018-02-11 HISTORY — DX: Other articular cartilage disorders, unspecified hip: M24.159

## 2018-02-11 HISTORY — PX: COLONOSCOPY WITH PROPOFOL: SHX5780

## 2018-02-11 HISTORY — DX: Other intervertebral disc degeneration, lumbar region without mention of lumbar back pain or lower extremity pain: M51.369

## 2018-02-11 HISTORY — DX: Headache, unspecified: R51.9

## 2018-02-11 HISTORY — DX: Headache: R51

## 2018-02-11 HISTORY — DX: Osteoarthritis of hip, unspecified: M16.9

## 2018-02-11 HISTORY — DX: Personal history of urinary calculi: Z87.442

## 2018-02-11 HISTORY — DX: Obesity, unspecified: E66.9

## 2018-02-11 HISTORY — DX: Radiculopathy, lumbar region: M54.16

## 2018-02-11 HISTORY — DX: Other intervertebral disc degeneration, lumbar region: M51.36

## 2018-02-11 HISTORY — DX: Zoster without complications: B02.9

## 2018-02-11 HISTORY — DX: Impaired glucose tolerance (oral): R73.02

## 2018-02-11 SURGERY — COLONOSCOPY WITH PROPOFOL
Anesthesia: General

## 2018-02-11 MED ORDER — PROPOFOL 500 MG/50ML IV EMUL
INTRAVENOUS | Status: AC
  Filled 2018-02-11: qty 50

## 2018-02-11 MED ORDER — SODIUM CHLORIDE 0.9 % IV SOLN: INTRAVENOUS | Status: DC

## 2018-02-11 MED ORDER — LIDOCAINE HCL (CARDIAC) PF 100 MG/5ML IV SOSY
PREFILLED_SYRINGE | INTRAVENOUS | Status: DC | PRN
  Administered 2018-02-11: 50 mg via INTRAVENOUS

## 2018-02-11 MED ORDER — PROPOFOL 10 MG/ML IV BOLUS
INTRAVENOUS | Status: DC | PRN
  Administered 2018-02-11: 90 mg via INTRAVENOUS

## 2018-02-11 MED ORDER — PROPOFOL 500 MG/50ML IV EMUL
INTRAVENOUS | Status: DC | PRN
  Administered 2018-02-11: 130 ug/kg/min via INTRAVENOUS

## 2018-02-11 MED ORDER — LIDOCAINE HCL (PF) 2 % IJ SOLN
INTRAMUSCULAR | Status: AC
  Filled 2018-02-11: qty 10

## 2018-02-11 MED ORDER — SODIUM CHLORIDE 0.9 % IV SOLN
INTRAVENOUS | Status: DC
  Administered 2018-02-11: 07:00:00 via INTRAVENOUS

## 2018-02-11 NOTE — Anesthesia Preprocedure Evaluation (Signed)
Anesthesia Evaluation  Patient identified by MRN, date of birth, ID band Patient awake    Reviewed: Allergy & Precautions, NPO status , Patient's Chart, lab work & pertinent test results  History of Anesthesia Complications Negative for: history of anesthetic complications  Airway Mallampati: III  TM Distance: >3 FB Neck ROM: Full    Dental no notable dental hx.    Pulmonary sleep apnea and Continuous Positive Airway Pressure Ventilation , neg COPD,    breath sounds clear to auscultation- rhonchi (-) wheezing      Cardiovascular Exercise Tolerance: Good hypertension, Pt. on medications (-) CAD, (-) Past MI, (-) Cardiac Stents and (-) CABG  Rhythm:Regular Rate:Normal - Systolic murmurs and - Diastolic murmurs    Neuro/Psych  Headaches, neg Seizures negative psych ROS   GI/Hepatic Neg liver ROS, PUD, GERD  ,  Endo/Other  negative endocrine ROSneg diabetes  Renal/GU Renal disease: hx of nephrolithiasis.     Musculoskeletal  (+) Arthritis ,   Abdominal (+) + obese,   Peds  Hematology negative hematology ROS (+)   Anesthesia Other Findings Past Medical History: No date: DDD (degenerative disc disease), lumbar No date: GERD (gastroesophageal reflux disease) No date: Headache     Comment:  not intractable No date: History of kidney stones No date: Hyperlipemia No date: Hypertension No date: Impaired glucose tolerance 2018: Kidney stones No date: Labral tear of hip, degenerative No date: Lumbar radiculitis No date: Obesity (BMI 30-39.9) No date: Shingles     Comment:  left T6 No date: Sleep apnea No date: Stomach ulcer   Reproductive/Obstetrics                             Anesthesia Physical Anesthesia Plan  ASA: II  Anesthesia Plan: General   Post-op Pain Management:    Induction: Intravenous  PONV Risk Score and Plan: 1 and Propofol infusion  Airway Management Planned:  Natural Airway  Additional Equipment:   Intra-op Plan:   Post-operative Plan:   Informed Consent: I have reviewed the patients History and Physical, chart, labs and discussed the procedure including the risks, benefits and alternatives for the proposed anesthesia with the patient or authorized representative who has indicated his/her understanding and acceptance.   Dental advisory given  Plan Discussed with: CRNA and Anesthesiologist  Anesthesia Plan Comments:         Anesthesia Quick Evaluation

## 2018-02-11 NOTE — Transfer of Care (Signed)
Immediate Anesthesia Transfer of Care Note  Patient: Joe Dunlap  Procedure(s) Performed: COLONOSCOPY WITH PROPOFOL (N/A )  Patient Location: PACU  Anesthesia Type:General  Level of Consciousness: awake, alert , oriented and patient cooperative  Airway & Oxygen Therapy: Patient Spontanous Breathing  Post-op Assessment: Report given to RN and Post -op Vital signs reviewed and stable  Post vital signs: Reviewed and stable  Last Vitals:  Vitals Value Taken Time  BP    Temp    Pulse 98 02/11/2018  8:00 AM  Resp 12 02/11/2018  8:00 AM  SpO2 98 % 02/11/2018  8:00 AM    Last Pain:  Vitals:   02/11/18 0705  TempSrc: Tympanic  PainSc: 5       Patients Stated Pain Goal: 0 (02/11/18 0705)  Complications: No apparent anesthesia complications

## 2018-02-11 NOTE — Anesthesia Postprocedure Evaluation (Signed)
Anesthesia Post Note  Patient: Joe Dunlap  Procedure(s) Performed: COLONOSCOPY WITH PROPOFOL (N/A )  Patient location during evaluation: Endoscopy Anesthesia Type: General Level of consciousness: awake and alert and oriented Pain management: pain level controlled Vital Signs Assessment: post-procedure vital signs reviewed and stable Respiratory status: spontaneous breathing, nonlabored ventilation and respiratory function stable Cardiovascular status: blood pressure returned to baseline and stable Postop Assessment: no signs of nausea or vomiting Anesthetic complications: no     Last Vitals:  Vitals:   02/11/18 0810 02/11/18 0820  BP: 122/89 122/84  Pulse: 79 76  Resp: 16 16  Temp:    SpO2: 98% 99%    Last Pain:  Vitals:   02/11/18 0800  TempSrc: Tympanic  PainSc:                  [name]

## 2018-02-11 NOTE — Op Note (Addendum)
[facility] Gastroenterology Patient Name: Joe Dunlap Procedure Date: 02/11/2018 7:16 AM MRN: [id number] Account #: [id number] Date of Birth: Feb 07, 1961 Admit Type: Outpatient Age: 57 Room: [facility] ENDO ROOM 3 Gender: Male Note Status: Finalized Procedure:            Colonoscopy Indications:          Personal history of colonic polyps Providers:            Christena Deem, MD Referring MD:         Marina Goodell (Referring MD) Medicines:            Monitored Anesthesia Care Complications:        No immediate complications. Procedure:            Pre-Anesthesia Assessment:                       - ASA Grade Assessment: III - A patient with severe                        systemic disease.                       After obtaining informed consent, the colonoscope was                        passed under direct vision. Throughout the procedure,                        the patient's blood pressure, pulse, and oxygen                        saturations were monitored continuously. The                        Colonoscope was introduced through the anus and                        advanced to the the cecum, identified by appendiceal                        orifice and ileocecal valve. The colonoscopy was                        performed without difficulty. The patient tolerated the                        procedure well. The quality of the bowel preparation                        was good. Findings:      Multiple medium-mouthed diverticula were found in the sigmoid colon and       distal descending colon.      A single medium-mouthed diverticulum was found at 35 cm proximal to the       anus. Erythema was seen in association with the diverticular opening.       Purulent discharge was seen in association with the diverticular       opening, suspicious of diverticulitis.      The digital rectal exam was normal.      The retroflexed view of the distal rectum and anal verge was  normal  and       showed no anal or rectal abnormalities.      The exam was otherwise without abnormality. Impression:           - Diverticulosis in the sigmoid colon and in the distal                        descending colon.                       - Mild diverticulosis at 35 cm proximal to the anus.                        Erythema was seen in association with the diverticular                        opening. Purulent discharge was seen in association                        with the diverticular opening, suspicious of                        diverticulitis.                       - The distal rectum and anal verge are normal on                        retroflexion view.                       - The examination was otherwise normal.                       - No specimens collected. Recommendation:       - Discharge patient to home. Procedure Code(s):    --- Professional ---                       [phone], Colonoscopy, flexible; diagnostic, including                        collection of specimen(s) by brushing or washing, when                        performed (separate procedure) Diagnosis Code(s):    --- Professional ---                       Z86.010, Personal history of colonic polyps                       K57.30, Diverticulosis of large intestine without                        perforation or abscess without bleeding CPT copyright 2018 [employer]. All rights reserved. The codes documented in this report are preliminary and upon coder review may  be revised to meet current compliance requirements. Christena DeemMartin U Iria Jamerson, MD 02/11/2018 8:01:34 AM This report has been signed electronically. Number of Addenda: 0 Note Initiated On: 02/11/2018 7:16 AM Scope Withdrawal Time: 0 hours 7 minutes 38 seconds  Total Procedure Duration: 0 hours 20 minutes 57 seconds  [facility]

## 2018-02-11 NOTE — Anesthesia Post-op Follow-up Note (Signed)
Anesthesia QCDR form completed.        

## 2018-02-11 NOTE — H&P (Signed)
Outpatient short stay form Pre-procedure 02/11/2018 7:22 AM Joe Dunlap [name] MD  Primary Physician: [name], MD  Reason for visit: Colonoscopy  History of present illness: Patient is a 57 year old male presenting today for colonoscopy in regards to his personal history of adenomatous colon polyps.  His last colonoscopy was 07/04/2013.  He tolerated his prep well.  He does take a 81 mg aspirin that has been held for several days.  He takes no other aspirin products or blood thinning agent.    Current Facility-Administered Medications:  .  0.9 %  sodium chloride infusion, , Intravenous, Continuous, Joe DeemSkulskie, [name], MD .  0.9 %  sodium chloride infusion, , Intravenous, Continuous, Joe DeemSkulskie, [name], MD  Medications Prior to Admission  Medication Sig Dispense Refill Last Dose  . AJOVY 225 MG/1.5ML SOSY    Past Week at Unknown time  . amLODipine (NORVASC) 5 MG tablet Take 5 mg by mouth daily.   02/10/2018 at Unknown time  . aspirin 81 MG EC tablet Take 1 tablet (81 mg total) by mouth daily. Swallow whole. 30 tablet 12 Past Week at Unknown time  . finasteride (PROSCAR) 5 MG tablet TAKE 1 TABLET BY MOUTH EVERY DAY 90 tablet 3 Past Week at Unknown time  . indapamide (LOZOL) 2.5 MG tablet Take 1 tablet (2.5 mg total) by mouth daily. 30 tablet 11 Past Week at Unknown time  . LORazepam (ATIVAN) 1 MG tablet Take 1 tablet (1 mg total) by mouth 2 (two) times daily. 20 tablet 0 Past Week at Unknown time  . mirabegron ER (MYRBETRIQ) 50 MG TB24 tablet Take 1 tablet (50 mg total) by mouth daily. 30 tablet 6 02/10/2018 at Unknown time  . nortriptyline (PAMELOR) 50 MG capsule    Past Week at Unknown time  . pravastatin (PRAVACHOL) 10 MG tablet Take 40 mg by mouth daily.    02/10/2018 at Unknown time  . sildenafil (REVATIO) 20 MG tablet Take 1 tablet (20 mg total) by mouth as needed. Take 1-5 tabs as needed prior to intercourse 30 tablet 11  at prn     Allergies  Allergen Reactions  . Nsaids  Other (See Comments)    Hx of ulcer     Past Medical History:  Diagnosis Date  . DDD (degenerative disc disease), lumbar   . GERD (gastroesophageal reflux disease)   . Headache    not intractable  . History of kidney stones   . Hyperlipemia   . Hypertension   . Impaired glucose tolerance   . Kidney stones 2018  . Labral tear of hip, degenerative   . Lumbar radiculitis   . Obesity (BMI 30-39.9)   . Shingles    left T6  . Sleep apnea   . Stomach ulcer     Review of systems:      Physical Exam    Heart and lungs: Rhythm without rub or gallop, lungs are bilaterally clear.    HEENT: Normocephalic atraumatic eyes are anicteric    Other:    Pertinant exam for procedure: Soft nontender nondistended bowel sounds are positive normoactive.  He does have a midline supraumbilical incisional hernia associated with an old scar from a ulcer surgery.    Planned proceedures: Colonoscopy and indicated procedures. I have discussed the risks benefits and complications of procedures to include not limited to bleeding, infection, perforation and the risk of sedation and the patient wishes to proceed.    Joe Dunlap Chelsi Warr, MD Gastroenterology 02/11/2018  7:22 AM

## 2018-02-12 ENCOUNTER — Encounter: Payer: Self-pay | Admitting: Gynecology

## 2018-02-12 ENCOUNTER — Ambulatory Visit
Admission: EM | Admit: 2018-02-12 | Discharge: 2018-02-12 | Disposition: A | Discharge status: Home / Self Care | Payer: BLUE CROSS/BLUE SHIELD | Attending: Emergency Medicine | Admitting: Emergency Medicine

## 2018-02-12 DIAGNOSIS — X58XXXA Exposure to other specified factors, initial encounter: Secondary | ICD-10-CM | POA: Diagnosis not present

## 2018-02-12 DIAGNOSIS — S39012A Strain of muscle, fascia and tendon of lower back, initial encounter: Secondary | ICD-10-CM | POA: Insufficient documentation

## 2018-02-12 LAB — URINALYSIS, COMPLETE (UACMP) WITH MICROSCOPIC
Bacteria, UA: NONE SEEN
Bilirubin Urine: NEGATIVE
Glucose, UA: NEGATIVE mg/dL
Ketones, ur: NEGATIVE mg/dL
Leukocytes, UA: NEGATIVE
Nitrite: NEGATIVE
Protein, ur: NEGATIVE mg/dL
RBC / HPF: NONE SEEN RBC/hpf (ref 0–5)
Specific Gravity, Urine: 1.02 (ref 1.005–1.030)
Squamous Epithelial / HPF: NONE SEEN (ref 0–5)
pH: 7 (ref 5.0–8.0)

## 2018-02-12 MED ORDER — KETOROLAC TROMETHAMINE 60 MG/2ML IM SOLN
30.0000 mg | Freq: Once | INTRAMUSCULAR | Status: AC
  Administered 2018-02-12: 30 mg via INTRAMUSCULAR

## 2018-02-12 MED ORDER — MELOXICAM 7.5 MG PO TABS: 7.5000 mg | ORAL_TABLET | Freq: Every day | ORAL | 0 refills | Status: DC

## 2018-02-12 MED ORDER — METAXALONE 800 MG PO TABS: 800.0000 mg | ORAL_TABLET | Freq: Three times a day (TID) | ORAL | 0 refills | Status: DC

## 2018-02-12 MED ORDER — LIDOCAINE 5 % EX PTCH: 1.0000 | MEDICATED_PATCH | CUTANEOUS | 0 refills | Status: DC

## 2018-02-12 NOTE — ED Provider Notes (Signed)
[facility]    CSN: [id number] Arrival date & time: 02/12/18  1513     History   Chief Complaint No chief complaint on file.   HPI Joe Dunlap is a 57 y.o. male.   HPI  57 year old male presents with left-sided low back pain that is had for 4 days.  Is nonradiating.  He does not remember any specific injury that may have caused his back pain.  He has had back injuries in the past that have responded with time and conservative measures.  He denies any incontinence.  He states it hurts to move his back in any position.        Past Medical History:  Diagnosis Date  . DDD (degenerative disc disease), lumbar   . GERD (gastroesophageal reflux disease)   . Headache    not intractable  . History of kidney stones   . Hyperlipemia   . Hypertension   . Impaired glucose tolerance   . Kidney stones 2018  . Labral tear of hip, degenerative   . Lumbar radiculitis   . Obesity (BMI 30-39.9)   . Shingles    left T6  . Sleep apnea   . Stomach ulcer     Patient Active Problem List   Diagnosis Date Noted  . Chronic tension-type headache, not intractable 03/04/2017  . Impaired glucose tolerance 01/09/2014  . Shingles 10/19/2013  . Right hip pain 09/20/2013  . DDD (degenerative disc disease), lumbar 09/07/2013  . Labral tear of hip, degenerative 09/07/2013  . Lumbar radiculitis 09/07/2013  . Elevated fasting blood sugar 08/03/2013  . GERD (gastroesophageal reflux disease) 08/03/2013  . Hyperlipidemia, unspecified 08/03/2013  . Hypertension, essential, benign 08/03/2013  . Low back pain 08/03/2013  . Abdominal pain 07/31/2013    Past Surgical History:  Procedure Laterality Date  . COLONOSCOPY WITH PROPOFOL N/A 02/11/2018   Procedure: COLONOSCOPY WITH PROPOFOL;  Surgeon: Christena Deem, MD;  Location: [facility] ENDOSCOPY;  Service: Endoscopy;  Laterality: N/A;  . EXTRACORPOREAL SHOCK WAVE LITHOTRIPSY Right 06/25/2016   Procedure: EXTRACORPOREAL SHOCK WAVE  LITHOTRIPSY (ESWL);  Surgeon: [name], MD;  Location: [facility] ORS;  Service: Urology;  Laterality: Right;  . GASTRIC ULCER REPAIR    . KNEE SURGERY Left   . TONSILLECTOMY    . UPPER GI ENDOSCOPY  2008   stomach ulcer  . UVULOPALATOPHARYNGOPLASTY (UPPP)/TONSILLECTOMY/SEPTOPLASTY  2000       Home Medications    Prior to Admission medications   Medication Sig Start Date End Date Taking? Authorizing Provider  amLODipine (NORVASC) 5 MG tablet Take 5 mg by mouth daily.   Yes [provider]  aspirin 81 MG EC tablet Take 1 tablet (81 mg total) by mouth daily. Swallow whole. 10/03/17  Yes Emily Filbert, MD  finasteride (PROSCAR) 5 MG tablet TAKE 1 TABLET BY MOUTH EVERY DAY 05/07/17  Yes McGowan, Carollee Herter A, PA-C  indapamide (LOZOL) 2.5 MG tablet Take 1 tablet (2.5 mg total) by mouth daily. 08/06/17  Yes [name], MD  mirabegron ER (MYRBETRIQ) 50 MG TB24 tablet Take 1 tablet (50 mg total) by mouth daily. 12/15/17  Yes McGowan, Carollee Herter A, PA-C  nortriptyline (PAMELOR) 50 MG capsule  07/03/16  Yes [provider]  pravastatin (PRAVACHOL) 10 MG tablet Take 40 mg by mouth daily.    Yes [provider]  sildenafil (REVATIO) 20 MG tablet Take 1 tablet (20 mg total) by mouth as needed. Take 1-5 tabs as needed prior to intercourse 10/16/16  Yes  [name], MD  lidocaine (LIDODERM) 5 % Place 1 patch onto the skin daily. Remove & Discard patch within 12 hours or as directed by MD 02/12/18   Lutricia Feil, PA-C  meloxicam (MOBIC) 7.5 MG tablet Take 1 tablet (7.5 mg total) by mouth daily. Take with food 02/12/18   Lutricia Feil, PA-C  metaxalone (SKELAXIN) 800 MG tablet Take 1 tablet (800 mg total) by mouth 3 (three) times daily. 02/12/18   Lutricia Feil, PA-C    Family History Family History  Problem Relation Age of Onset  . Heart attack Father   . Prostatitis Neg Hx   . Prostate cancer Neg Hx   . Kidney cancer Neg Hx   . Bladder Cancer Neg Hx      Social History Social History   Tobacco Use  . Smoking status: Never Smoker  . Smokeless tobacco: Never Used  Substance Use Topics  . Alcohol use: Yes  . Drug use: No     Allergies   Nsaids   Review of Systems Review of Systems  Constitutional: Positive for activity change. Negative for appetite change, chills, fatigue and fever.  Musculoskeletal: Positive for back pain.  All other systems reviewed and are negative.    Physical Exam Triage Vital Signs ED Triage Vitals  Enc Vitals Group     BP 02/12/18 1518 (!) 161/81     Pulse Rate 02/12/18 1518 (!) 125     Resp 02/12/18 1518 16     Temp 02/12/18 1518 98.7 F (37.1 C)     Temp Source 02/12/18 1518 Oral     SpO2 02/12/18 1518 97 %     Weight 02/12/18 1520 265 lb (120.2 kg)     Height --      Head Circumference --      Peak Flow --      Pain Score 02/12/18 1520 7     Pain Loc --      Pain Edu? --      Excl. in GC? --    No data found.  Updated Vital Signs BP (!) 161/81   Pulse (!) 125   Temp 98.7 F (37.1 C) (Oral)   Resp 16   Wt 265 lb (120.2 kg)   SpO2 97%   BMI 38.02 kg/m   Visual Acuity Right Eye Distance:   Left Eye Distance:   Bilateral Distance:    Right Eye Near:   Left Eye Near:    Bilateral Near:     Physical Exam Vitals signs and nursing note reviewed.  Constitutional:      General: He is not in acute distress.    Appearance: Normal appearance. He is obese. He is not ill-appearing, toxic-appearing or diaphoretic.  HENT:     Head: Normocephalic.     Nose: Nose normal.     Mouth/Throat:     Mouth: Mucous membranes are moist.  Eyes:     Pupils: Pupils are equal, round, and reactive to light.  Musculoskeletal:        General: Tenderness present.     Comments: Exam of the low back there is tenderness to palpation over the right paraspinous muscles L4-S1.  Should not has a tendency to stand in a slightly forward stooped.  Patient is able to forward flex with his hands level of  his knees.  Returning to upright posture is difficult.  Flexion also reproduces symptoms in the lower segments on the right.  Is able to toe and  heel walk normally  Skin:    General: Skin is warm and dry.  Neurological:     General: No focal deficit present.     Mental Status: He is alert and oriented to person, place, and time.  Psychiatric:        Mood and Affect: Mood normal.        Behavior: Behavior normal.        Thought Content: Thought content normal.        Judgment: Judgment normal.      UC Treatments / Results  Labs (all labs ordered are listed, but only abnormal results are displayed) Labs Reviewed  URINALYSIS, COMPLETE (UACMP) WITH MICROSCOPIC - Abnormal; Notable for the following components:      Result Value   Hgb urine dipstick TRACE (*)    All other components within normal limits    EKG None  Radiology No results found.  Procedures Procedures (including critical care time)  Medications Ordered in UC Medications  ketorolac (TORADOL) injection 30 mg (30 mg Intramuscular Given 02/12/18 1609)    Initial Impression / Assessment and Plan / UC Course  I have reviewed the triage vital signs and the nursing notes.  Pertinent labs & imaging results that were available during my care of the patient were reviewed by me and considered in my medical decision making (see chart for details).   Will prescribe Mobic and Skelaxin as well as lidocaine patch for the back pain.  He is allergic, supposedly, to NSAIDs but when questioned he states that he had a bleeding ulcer 10 years ago and his physician told him to take no more than 2 weeks of NSAIDs at a time.  Had no bleeding since the initial episode.  He should avoid all symptoms as much as possible.  He should limit his sitting lifting or bending.  He may use ice on the area for 2 days and alternate heat or ice whichever is more beneficial for him.  If he is not improving or worsening he should follow-up with his primary  care physician.   Final Clinical Impressions(s) / UC Diagnoses   Final diagnoses:  Lumbar strain, initial encounter     Discharge Instructions     Apply ice 20 minutes out of every 2 hours 4-5 times daily for comfort. Use  Caution while taking muscle relaxers.  Do not perform activities requiring concentration or judgment and do not drive.  Avoid symptoms as much as possible.  Limit sitting lifting or bending.    ED Prescriptions    Medication Sig Dispense Auth. Provider   meloxicam (MOBIC) 7.5 MG tablet Take 1 tablet (7.5 mg total) by mouth daily. Take with food 14 tablet Lutricia FeilRoemer, Decari Duggar P, PA-C   metaxalone (SKELAXIN) 800 MG tablet Take 1 tablet (800 mg total) by mouth 3 (three) times daily. 21 tablet Ovid Curdoemer, Cletis Clack P, PA-C   lidocaine (LIDODERM) 5 % Place 1 patch onto the skin daily. Remove & Discard patch within 12 hours or as directed by MD 15 patch Lutricia Feiloemer, Conleigh Heinlein P, PA-C     Controlled Substance Prescriptions [place] Controlled Substance Registry consulted? Not Applicable   Lutricia FeilRoemer, Dava Rensch P, PA-C 02/12/18 [id number]

## 2018-02-12 NOTE — Discharge Instructions (Addendum)
Apply ice 20 minutes out of every 2 hours 4-5 times daily for comfort. Use  Caution while taking muscle relaxers.  Do not perform activities requiring concentration or judgment and do not drive.  Avoid symptoms as much as possible.  Limit sitting lifting or bending.

## 2018-02-12 NOTE — ED Triage Notes (Signed)
Patient c/o history of kidney stones. Patient c/o lower back pain x 4 days.

## 2018-04-22 ENCOUNTER — Ambulatory Visit: Payer: BLUE CROSS/BLUE SHIELD | Admitting: Urology

## 2018-05-11 ENCOUNTER — Encounter
Admission: RE | Disposition: A | Discharge status: Home / Self Care | Payer: Self-pay | Source: Home / Self Care | Attending: Cardiology

## 2018-05-11 ENCOUNTER — Ambulatory Visit
Admission: RE | Admit: 2018-05-11 | Discharge: 2018-05-11 | Disposition: A | Discharge status: Home / Self Care | Payer: BLUE CROSS/BLUE SHIELD | Attending: Cardiology | Admitting: Cardiology

## 2018-05-11 ENCOUNTER — Encounter: Payer: Self-pay | Admitting: *Deleted

## 2018-05-11 ENCOUNTER — Other Ambulatory Visit: Payer: Self-pay

## 2018-05-11 ENCOUNTER — Other Ambulatory Visit: Payer: Self-pay | Admitting: Urology

## 2018-05-11 DIAGNOSIS — I10 Essential (primary) hypertension: Secondary | ICD-10-CM | POA: Insufficient documentation

## 2018-05-11 DIAGNOSIS — Z7982 Long term (current) use of aspirin: Secondary | ICD-10-CM | POA: Diagnosis not present

## 2018-05-11 DIAGNOSIS — Z6837 Body mass index (BMI) 37.0-37.9, adult: Secondary | ICD-10-CM | POA: Diagnosis not present

## 2018-05-11 DIAGNOSIS — R9439 Abnormal result of other cardiovascular function study: Secondary | ICD-10-CM

## 2018-05-11 DIAGNOSIS — Z79899 Other long term (current) drug therapy: Secondary | ICD-10-CM | POA: Insufficient documentation

## 2018-05-11 DIAGNOSIS — E785 Hyperlipidemia, unspecified: Secondary | ICD-10-CM | POA: Diagnosis not present

## 2018-05-11 DIAGNOSIS — G4733 Obstructive sleep apnea (adult) (pediatric): Secondary | ICD-10-CM | POA: Insufficient documentation

## 2018-05-11 DIAGNOSIS — I2510 Atherosclerotic heart disease of native coronary artery without angina pectoris: Secondary | ICD-10-CM | POA: Diagnosis not present

## 2018-05-11 DIAGNOSIS — K219 Gastro-esophageal reflux disease without esophagitis: Secondary | ICD-10-CM | POA: Insufficient documentation

## 2018-05-11 DIAGNOSIS — E669 Obesity, unspecified: Secondary | ICD-10-CM | POA: Insufficient documentation

## 2018-05-11 DIAGNOSIS — E7800 Pure hypercholesterolemia, unspecified: Secondary | ICD-10-CM | POA: Diagnosis not present

## 2018-05-11 HISTORY — PX: LEFT HEART CATH AND CORONARY ANGIOGRAPHY: CATH118249

## 2018-05-11 SURGERY — LEFT HEART CATH AND CORONARY ANGIOGRAPHY
Anesthesia: Moderate Sedation | Laterality: Left

## 2018-05-11 MED ORDER — MIDAZOLAM HCL 2 MG/2ML IJ SOLN
INTRAMUSCULAR | Status: DC | PRN
  Administered 2018-05-11 (×2): 1 mg via INTRAVENOUS

## 2018-05-11 MED ORDER — FENTANYL CITRATE (PF) 100 MCG/2ML IJ SOLN
INTRAMUSCULAR | Status: DC | PRN
  Administered 2018-05-11 (×2): 25 ug via INTRAVENOUS

## 2018-05-11 MED ORDER — ASPIRIN 81 MG PO CHEW
CHEWABLE_TABLET | ORAL | Status: AC
  Administered 2018-05-11: 81 mg
  Filled 2018-05-11: qty 1

## 2018-05-11 MED ORDER — MIDAZOLAM HCL 2 MG/2ML IJ SOLN
INTRAMUSCULAR | Status: AC
  Filled 2018-05-11: qty 2

## 2018-05-11 MED ORDER — FENTANYL CITRATE (PF) 100 MCG/2ML IJ SOLN
INTRAMUSCULAR | Status: AC
  Filled 2018-05-11: qty 2

## 2018-05-11 MED ORDER — SODIUM CHLORIDE 0.9 % IV SOLN: 250.0000 mL | INTRAVENOUS | Status: DC | PRN

## 2018-05-11 MED ORDER — ONDANSETRON HCL 4 MG/2ML IJ SOLN: 4.0000 mg | Freq: Four times a day (QID) | INTRAMUSCULAR | Status: DC | PRN

## 2018-05-11 MED ORDER — SODIUM CHLORIDE 0.9% FLUSH: 3.0000 mL | INTRAVENOUS | Status: DC | PRN

## 2018-05-11 MED ORDER — SODIUM CHLORIDE 0.9 % WEIGHT BASED INFUSION
3.0000 mL/kg/h | INTRAVENOUS | Status: AC
  Administered 2018-05-11: 3 mL/kg/h via INTRAVENOUS

## 2018-05-11 MED ORDER — ASPIRIN 81 MG PO CHEW: 81.0000 mg | CHEWABLE_TABLET | ORAL | Status: DC

## 2018-05-11 MED ORDER — SODIUM CHLORIDE 0.9 % WEIGHT BASED INFUSION: 1.0000 mL/kg/h | INTRAVENOUS | Status: DC

## 2018-05-11 MED ORDER — SODIUM CHLORIDE 0.9% FLUSH: 3.0000 mL | Freq: Two times a day (BID) | INTRAVENOUS | Status: DC

## 2018-05-11 MED ORDER — ACETAMINOPHEN 325 MG PO TABS: 650.0000 mg | ORAL_TABLET | ORAL | Status: DC | PRN

## 2018-05-11 MED ORDER — HEPARIN (PORCINE) IN NACL 1000-0.9 UT/500ML-% IV SOLN
INTRAVENOUS | Status: AC
  Filled 2018-05-11: qty 1000

## 2018-05-11 SURGICAL SUPPLY — 10 items
CATH INFINITI 5FR ANG PIGTAIL (CATHETERS) ×1 IMPLANT
CATH INFINITI 5FR JL4 (CATHETERS) ×1 IMPLANT
CATH INFINITI JR4 5F (CATHETERS) ×1 IMPLANT
DEVICE CLOSURE MYNXGRIP 5F (Vascular Products) ×1 IMPLANT
KIT MANI 3VAL PERCEP (MISCELLANEOUS) ×2 IMPLANT
NDL PERC 18GX7CM (NEEDLE) IMPLANT
NEEDLE PERC 18GX7CM (NEEDLE) ×2 IMPLANT
PACK CARDIAC CATH (CUSTOM PROCEDURE TRAY) ×2 IMPLANT
SHEATH AVANTI 5FR X 11CM (SHEATH) ×1 IMPLANT
WIRE GUIDERIGHT .035X150 (WIRE) ×1 IMPLANT

## 2018-05-11 NOTE — Discharge Instructions (Signed)

## 2018-05-11 NOTE — OR Nursing (Signed)
Pt waiting in room for ride home post cath. Pt instructed he had to have ride home due to sedation received. when checked on him at 1028 he was not in room. No staff members saw him leave despite many staff members in area.

## 2018-06-17 ENCOUNTER — Ambulatory Visit: Payer: BLUE CROSS/BLUE SHIELD | Admitting: Urology

## 2018-08-05 ENCOUNTER — Other Ambulatory Visit: Payer: Self-pay | Admitting: Urology

## 2018-08-14 ENCOUNTER — Other Ambulatory Visit: Payer: Self-pay | Admitting: Urology

## 2018-08-31 ENCOUNTER — Emergency Department
Admission: EM | Admit: 2018-08-31 | Discharge: 2018-08-31 | Discharge status: Absent without leave | Payer: BLUE CROSS/BLUE SHIELD

## 2018-08-31 ENCOUNTER — Other Ambulatory Visit: Payer: Self-pay

## 2018-08-31 ENCOUNTER — Other Ambulatory Visit
Admit: 2018-08-31 | Discharge: 2018-08-31 | Disposition: A | Discharge status: Home / Self Care | Payer: BC Managed Care – PPO | Attending: Family Medicine | Admitting: Family Medicine

## 2018-08-31 NOTE — ED Notes (Addendum)
21:30 Pt employer requests [employer] for Worker's Comp.  Consents obtained.  Specimen collected and released to lab via chain of custody protocol.  Specimen ID no [id number]

## 2018-08-31 NOTE — ED Triage Notes (Addendum)
Patient coming in for right finger injury at work, Patient says he slammed it between door and eye blots. Patient states he is only here for the drug test and does not need to see a doctor for his finger.

## 2018-09-16 ENCOUNTER — Ambulatory Visit: Payer: BLUE CROSS/BLUE SHIELD | Admitting: Urology

## 2018-11-06 ENCOUNTER — Other Ambulatory Visit: Payer: Self-pay | Admitting: Urology

## 2018-11-18 ENCOUNTER — Other Ambulatory Visit: Payer: Self-pay | Admitting: Family Medicine

## 2018-11-18 DIAGNOSIS — N401 Enlarged prostate with lower urinary tract symptoms: Secondary | ICD-10-CM

## 2018-11-21 ENCOUNTER — Other Ambulatory Visit: Payer: BC Managed Care – PPO

## 2018-11-21 ENCOUNTER — Other Ambulatory Visit: Payer: Self-pay

## 2018-11-21 DIAGNOSIS — N401 Enlarged prostate with lower urinary tract symptoms: Secondary | ICD-10-CM

## 2018-11-22 LAB — PSA: Prostate Specific Ag, Serum: 0.6 ng/mL (ref 0.0–4.0)

## 2018-11-25 ENCOUNTER — Other Ambulatory Visit: Payer: Self-pay | Admitting: Urology

## 2018-11-28 ENCOUNTER — Other Ambulatory Visit: Payer: Self-pay

## 2018-12-09 ENCOUNTER — Ambulatory Visit: Payer: Self-pay | Admitting: Urology

## 2018-12-16 ENCOUNTER — Ambulatory Visit: Payer: Self-pay | Admitting: Urology

## 2018-12-19 ENCOUNTER — Ambulatory Visit: Payer: Self-pay | Admitting: Urology

## 2018-12-19 NOTE — Progress Notes (Deleted)
12/19/2018 8:31 AM   Joe Dunlap 05-30-1960 [id number]  Referring provider: Marina GoodellFeldpausch, Dale E, MD [address] [place],  [place] [id number]  No chief complaint on file.   HPI: 58 year old male who returns today for routine follow-up.  Stones Pre-2018 s/p ESWL x 1 in remotepast S/p sucessful right ESWL for a 6 mm proximal ureteral calculus on 2/18 KUB shows bilateral stones, up to 9 mm in the LLP  Stone composition shows calcium oxalate monohydrate 70%, calcium oxalate dihydrate 27%, calcium phosphate 3%  He has had a repeat [facility] evaluation with a low urinary volume, previously 2.6 but now 1.89 L/day.  His urinary calcium remains markedly elevated at 465 mg/day, down from 591 mg/day previously.  His urinary oxalate levels higher than previously now 70 mg/day, previously 60 mg/day.  His urinary citrate is 1420 grams per day which is significantly improved.  Urinary pH 6.43.  Serum calcium levels remain normal.   PMH: Past Medical History:  Diagnosis Date  . DDD (degenerative disc disease), lumbar   . GERD (gastroesophageal reflux disease)   . Headache    not intractable  . History of kidney stones   . Hyperlipemia   . Hypertension   . Impaired glucose tolerance   . Kidney stones 2018  . Labral tear of hip, degenerative   . Lumbar radiculitis   . Obesity (BMI 30-39.9)   . Shingles    left T6  . Sleep apnea   . Stomach ulcer     Surgical History: Past Surgical History:  Procedure Laterality Date  . COLONOSCOPY WITH PROPOFOL N/A 02/11/2018   Procedure: COLONOSCOPY WITH PROPOFOL;  Surgeon: Christena DeemSkulskie, Martin U, MD;  [facility] END[facility]COPY;  Service: Endoscopy;  Laterality: N/A;  . EXTRACORPOREAL SHOCK WAVE LITHOTRIPSY Right 06/25/2016   Procedure: EXTRACORPOREAL SHOCK WAVE LITHOTRIPSY (ESWL);  Surgeon: Vanna ScotlandAshley Gracelyn Coventry, MD;  Location: [facility] ORS;  Service: Urology;  Laterality: Right;  . GASTRIC ULCER REPAIR    . KNEE SURGERY Left   . LEFT HEART CATH AND  CORONARY ANGIOGRAPHY Left 05/11/2018   Procedure: LEFT HEART CATH AND CORONARY ANGIOGRAPHY;  Surgeon: Marcina MillardParaschos, Alexander, MD;  Location: [facility] INVASIVE CV LAB;  Service: Cardiovascular;  Laterality: Left;  . TONSILLECTOMY    . UPPER GI ENDOSCOPY  2008   stomach ulcer  . UVULOPALATOPHARYNGOPLASTY (UPPP)/TONSILLECTOMY/SEPTOPLASTY  2000    Home Medications:  Allergies as of 12/19/2018      Reactions   Nsaids Other (See Comments)   Hx of ulcer      Medication List       Accurate as of December 19, 2018  8:31 AM. If you have any questions, ask your nurse or doctor.        amLODipine 5 MG tablet Commonly known as: NORVASC Take 5 mg by mouth at bedtime.   aspirin 81 MG EC tablet Take 1 tablet (81 mg total) by mouth daily. Swallow whole. What changed: when to take this   finasteride 5 MG tablet Commonly known as: PROSCAR TAKE 1 TABLET BY MOUTH EVERY DAY   indapamide 2.5 MG tablet Commonly known as: LOZOL TAKE 1 TABLET BY MOUTH DAILY   lidocaine 5 % Commonly known as: Lidoderm Place 1 patch onto the skin daily. Remove & Discard patch within 12 hours or as directed by MD   meloxicam 7.5 MG tablet Commonly known as: Mobic Take 1 tablet (7.5 mg total) by mouth daily. Take with food   metaxalone 800 MG tablet Commonly known as: SKELAXIN Take  1 tablet (800 mg total) by mouth 3 (three) times daily.   mometasone 50 MCG/ACT nasal spray Commonly known as: NASONEX Place 2 sprays into the nose daily as needed (allergies/congestion.).   Myrbetriq 50 MG Tb24 tablet Generic drug: mirabegron ER TAKE 1 TABLET(50 MG) BY MOUTH DAILY   pravastatin 40 MG tablet Commonly known as: PRAVACHOL Take 40 mg by mouth at bedtime.   sildenafil 20 MG tablet Commonly known as: Revatio Take 1 tablet (20 mg total) by mouth as needed. Take 1-5 tabs as needed prior to intercourse       Allergies:  Allergies  Allergen Reactions  . Nsaids Other (See Comments)    Hx of ulcer    Family  History: Family History  Problem Relation Age of Onset  . Heart attack Father   . Prostatitis Neg Hx   . Prostate cancer Neg Hx   . Kidney cancer Neg Hx   . Bladder Cancer Neg Hx     Social History:  reports that he has never smoked. He has never used smokeless tobacco. He reports current alcohol use. He reports that he does not use drugs.  ROS:                                        Physical Exam: There were no vitals taken for this visit.  Constitutional:  Alert and oriented, No acute distress. HEENT: [place] AT, moist mucus membranes.  Trachea midline, no masses. Cardiovascular: No clubbing, cyanosis, or edema. Respiratory: Normal respiratory effort, no increased work of breathing. GI: Abdomen is soft, nontender, nondistended, no abdominal masses GU: No CVA tenderness Lymph: No cervical or inguinal lymphadenopathy. Skin: No rashes, bruises or suspicious lesions. Neurologic: Grossly intact, no focal deficits, moving all 4 extremities. Psychiatric: Normal mood and affect.  Laboratory Data: Lab Results  Component Value Date   WBC 9.6 10/03/2017   HGB 16.6 10/03/2017   HCT 47.2 10/03/2017   MCV 89.8 10/03/2017   PLT 257 10/03/2017    Lab Results  Component Value Date   CREATININE 1.07 10/03/2017    No results found for: PSA  No results found for: TESTOSTERONE  No results found for: HGBA1C  Urinalysis    Component Value Date/Time   COLORURINE YELLOW 02/12/2018 1528   APPEARANCEUR CLEAR 02/12/2018 1528   LABSPEC 1.020 02/12/2018 1528   PHURINE 7.0 02/12/2018 1528   GLUCOSEU NEGATIVE 02/12/2018 1528   HGBUR TRACE (A) 02/12/2018 1528   BILIRUBINUR NEGATIVE 02/12/2018 1528   KETONESUR NEGATIVE 02/12/2018 1528   PROTEINUR NEGATIVE 02/12/2018 1528   NITRITE NEGATIVE 02/12/2018 1528   LEUKOCYTESUR NEGATIVE 02/12/2018 1528    Lab Results  Component Value Date   BACTERIA NONE SEEN 02/12/2018    Pertinent Imaging: *** Results for orders  placed during the hospital encounter of 05/07/17  Abdomen 1 view (KUB)   Narrative CLINICAL DATA:  History of kidney stones.  No current complaints.  EXAM: ABDOMEN - 1 VIEW  COMPARISON:  07/10/2016  FINDINGS: There are numerous calculi projecting over each kidney, largest discrete stone from the lower pole the left kidney. These are essentially stable from the prior exam.  No convincing ureteral stone.  Normal bowel gas pattern.  Stable degenerative changes and mild dextroscoliosis of the lumbar spine.  IMPRESSION: 1. No acute findings.  No evidence of a ureteral stone. 2. Multiple bilateral intrarenal stones similar to the prior abdominal  radiographs.   Electronically Signed   By: Lajean Manes M.D.   On: 05/07/2017 14:52    No results found for this or any previous visit. No results found for this or any previous visit. No results found for this or any previous visit. No results found for this or any previous visit. No results found for this or any previous visit. Results for orders placed during the hospital encounter of 05/19/16  CT HEMATURIA WORKUP   Narrative CLINICAL DATA:  Microscopic hematuria. Urinary frequency. Nephrolithiasis.  EXAM: CT ABDOMEN AND PELVIS WITHOUT AND WITH CONTRAST  TECHNIQUE: Multidetector CT imaging of the abdomen and pelvis was performed following the standard protocol before and following the bolus administration of intravenous contrast.  CONTRAST:  185mL ISOVUE-300 IOPAMIDOL (ISOVUE-300) INJECTION 61%  COMPARISON:  08/07/2013  FINDINGS: Lower Chest: No acute findings. Stable 6 mm pulmonary nodule in anterior right lower lobe, consistent with benign etiology.  Hepatobiliary:  No masses identified. Gallbladder is unremarkable.  Pancreas:  No mass or inflammatory changes.  Spleen: Within normal limits in size and appearance.  Adrenals/Urinary Tract: No adrenal masses identified. Several small less than 5 mm intrarenal  calculi are seen bilaterally. A new 6 mm calculus is seen in the proximal right ureter on image 47/2, however there is no evidence hydronephrosis. No evidence of left ureteral calculi or dilatation. No calculi visualized within the urinary bladder.  Tiny sub-cm left renal cyst remains stable. No complex cystic or solid renal masses are identified. No masses seen involving the ureters or bladder.  Stomach/Bowel: No evidence of obstruction, inflammatory process or abnormal fluid collections. Sigmoid diverticulosis is demonstrated, without evidence diverticulitis. A large midline epigastric ventral hernia is seen containing multiple small bowel loops. No evidence of bowel obstruction or ischemia.  Vascular/Lymphatic: No pathologically enlarged lymph nodes. No abdominal aortic aneurysm. Aortic atherosclerosis.  Reproductive:  No mass or other significant abnormality.  Other:  None.  Musculoskeletal:  No suspicious bone lesions identified.  IMPRESSION: 6 mm proximal right ureteral calculus, without evidence of hydronephrosis. Tiny nonobstructing bilateral intrarenal calculi also seen.  No radiographic evidence of urinary tract neoplasm.  Colonic diverticulosis. No radiographic evidence of diverticulitis.  Stable large ventral hernia containing small bowel.  Aortic atherosclerosis.   Electronically Signed   By: Earle Gell M.D.   On: 05/19/2016 16:48    No results found for this or any previous visit.  Assessment & Plan:    There are no diagnoses linked to this encounter.  No follow-ups on file.  Hollice Espy, MD  [facility] [address] [place], [place] [address] [phone]

## 2018-12-30 ENCOUNTER — Ambulatory Visit
Admission: RE | Admit: 2018-12-30 | Discharge: 2018-12-30 | Disposition: A | Discharge status: Home / Self Care | Payer: BC Managed Care – PPO | Attending: Urology | Admitting: Urology

## 2018-12-30 ENCOUNTER — Ambulatory Visit: Payer: BC Managed Care – PPO | Admitting: Urology

## 2018-12-30 ENCOUNTER — Other Ambulatory Visit: Payer: Self-pay | Admitting: Urology

## 2018-12-30 ENCOUNTER — Ambulatory Visit
Admission: RE | Admit: 2018-12-30 | Discharge: 2018-12-30 | Disposition: A | Discharge status: Home / Self Care | Payer: BC Managed Care – PPO | Source: Ambulatory Visit | Attending: Urology | Admitting: Urology

## 2018-12-30 ENCOUNTER — Other Ambulatory Visit: Payer: Self-pay

## 2018-12-30 ENCOUNTER — Encounter: Payer: Self-pay | Admitting: Urology

## 2018-12-30 ENCOUNTER — Other Ambulatory Visit
Admission: RE | Admit: 2018-12-30 | Discharge: 2018-12-30 | Disposition: A | Discharge status: Home / Self Care | Payer: BC Managed Care – PPO | Source: Home / Self Care | Attending: Urology | Admitting: Urology

## 2018-12-30 VITALS — BP 127/68 | HR 78 | Ht 70.0 in | Wt 268.0 lb

## 2018-12-30 DIAGNOSIS — R34 Anuria and oliguria: Secondary | ICD-10-CM | POA: Diagnosis not present

## 2018-12-30 DIAGNOSIS — R350 Frequency of micturition: Secondary | ICD-10-CM

## 2018-12-30 DIAGNOSIS — R82994 Hypercalciuria: Secondary | ICD-10-CM

## 2018-12-30 DIAGNOSIS — N200 Calculus of kidney: Secondary | ICD-10-CM

## 2018-12-30 LAB — URINALYSIS, COMPLETE (UACMP) WITH MICROSCOPIC
Bilirubin Urine: NEGATIVE
Glucose, UA: NEGATIVE mg/dL
Hgb urine dipstick: NEGATIVE
Ketones, ur: NEGATIVE mg/dL
Leukocytes,Ua: NEGATIVE
Nitrite: NEGATIVE
Protein, ur: NEGATIVE mg/dL
RBC / HPF: NONE SEEN RBC/hpf (ref 0–5)
Specific Gravity, Urine: 1.02 (ref 1.005–1.030)
pH: 8 (ref 5.0–8.0)

## 2018-12-30 NOTE — Progress Notes (Signed)
12/30/2018 2:56 PM   [facility] 1960-04-06 [id number]  Referring provider: Marina GoodellFeldpausch, Dale E, MD [address] [place],  [place] [id number]  Chief Complaint  Patient presents with  . Nephrolithiasis    1year follow up    HPI: 58 year old male followed for multiple medical issues who returns today for routine follow-up.  He was last seen on 07/2017 and follow-up has been delayed for various reasons.  He returns today with repeat 24 urine.  Stones Pre-2018 s/p ESWL x 1 in remotepast S/p sucessful right ESWL for a 6 mm proximal ureteral calculus on 2/18  KUB shows bilateral stones, up to 9 mm in the LLP 2019  Stone composition shows calcium oxalate monohydrate 70%, calcium oxalate dihydrate 27%, calcium phosphate 3%  He continues to take indapamide 2.5 mg for stone prevention.  His previous 24 urine showed adequate urine output and elevated urinary calcium levels.  He is post to have a 24-hour urine after starting indapamide 2 ensure that his calcium levels were improving which he failed to do until recently.  Returns today with 24-hour urine results.  Today, his 24 urine shows decreased urine output down to 1.89 from previous 2.5 L.  He has had some improvement in his urinary calcium levels albeit still markedly elevated to 465 mg/day, previously 591 mg/day.  His urinary oxalate level continues to remain elevated 71 mg/day, higher than previous.  Urinary citrate is excellent 1420 mg/day.  He is passed 2 stones since his last visit, 1 is a few weeks ago.  He was able to pass pass this fairly easily. \  BPH with LUTS Currently on finasteride. He stopped taking Flomax for unclear reasons although states it wasn't helping him much. He is also been taking Myrbetriq 50 mg over the past year.  He continues to struggle with urinary frequency and urgency as his primary complaint.  This continues to be an ongoing issue for him.  IPSS as below.   He drinks primarily water. He  does drink at least once a day and is previously tried cutting this out without improvement of the symptoms.  Most recent PSA 0.6 on 11/21/2018.  IPSS    Row Name 12/30/18 1000         International Prostate Symptom Score   How often have you had the sensation of not emptying your bladder?  Not at All     How often have you had to urinate less than every two hours?  Almost always     How often have you found you stopped and started again several times when you urinated?  Not at All     How often have you found it difficult to postpone urination?  More than half the time     How often have you had a weak urinary stream?  Not at All     How often have you had to strain to start urination?  Not at All     How many times did you typically get up at night to urinate?  2 Times     Total IPSS Score  11       Quality of Life due to urinary symptoms   If you were to spend the rest of your life with your urinary condition just the way it is now how would you feel about that?  Unhappy        Score:  1-7 Mild 8-19 Moderate 20-35 Severe   PMH: Past Medical History:  Diagnosis Date  . DDD (degenerative disc disease), lumbar   . GERD (gastroesophageal reflux disease)   . Headache    not intractable  . History of kidney stones   . Hyperlipemia   . Hypertension   . Impaired glucose tolerance   . Kidney stones 2018  . Labral tear of hip, degenerative   . Lumbar radiculitis   . Obesity (BMI 30-39.9)   . Shingles    left T6  . Sleep apnea   . Stomach ulcer     Surgical History: Past Surgical History:  Procedure Laterality Date  . COLONOSCOPY WITH PROPOFOL N/A 02/11/2018   Procedure: COLONOSCOPY WITH PROPOFOL;  Surgeon: Christena Deem, MD;  Location: [facility] ENDOSCOPY;  Service: Endoscopy;  Laterality: N/A;  . EXTRACORPOREAL SHOCK WAVE LITHOTRIPSY Right 06/25/2016   Procedure: EXTRACORPOREAL SHOCK WAVE LITHOTRIPSY (ESWL);  Surgeon: [name], MD;  Location: [facility] ORS;   Service: Urology;  Laterality: Right;  . GASTRIC ULCER REPAIR    . KNEE SURGERY Left   . LEFT HEART CATH AND CORONARY ANGIOGRAPHY Left 05/11/2018   Procedure: LEFT HEART CATH AND CORONARY ANGIOGRAPHY;  Surgeon: Marcina Millard, MD;  Location: [facility] INVASIVE CV LAB;  Service: Cardiovascular;  Laterality: Left;  . TONSILLECTOMY    . UPPER GI ENDOSCOPY  2008   stomach ulcer  . UVULOPALATOPHARYNGOPLASTY (UPPP)/TONSILLECTOMY/SEPTOPLASTY  2000    Home Medications:  Allergies as of 12/30/2018      Reactions   Nsaids Other (See Comments)   Hx of ulcer      Medication List       Accurate as of December 30, 2018 11:59 PM. If you have any questions, ask your nurse or doctor.        STOP taking these medications   meloxicam 7.5 MG tablet Commonly known as: Mobic Stopped by: [name], MD   metaxalone 800 MG tablet Commonly known as: SKELAXIN Stopped by: [name], MD     TAKE these medications   amLODipine 5 MG tablet Commonly known as: NORVASC Take 5 mg by mouth at bedtime.   aspirin 81 MG EC tablet Take 1 tablet (81 mg total) by mouth daily. Swallow whole. What changed: when to take this   finasteride 5 MG tablet Commonly known as: PROSCAR TAKE 1 TABLET BY MOUTH EVERY DAY   indapamide 2.5 MG tablet Commonly known as: LOZOL TAKE 1 TABLET BY MOUTH DAILY   lidocaine 5 % Commonly known as: Lidoderm Place 1 patch onto the skin daily. Remove & Discard patch within 12 hours or as directed by MD   metoprolol succinate 25 MG 24 hr tablet Commonly known as: TOPROL-XL Take 25 mg by mouth daily.   mometasone 50 MCG/ACT nasal spray Commonly known as: NASONEX Place 2 sprays into the nose daily as needed (allergies/congestion.).   Myrbetriq 50 MG Tb24 tablet Generic drug: mirabegron ER TAKE 1 TABLET(50 MG) BY MOUTH DAILY   pravastatin 40 MG tablet Commonly known as: PRAVACHOL Take 40 mg by mouth at bedtime.   sildenafil 20 MG tablet Commonly known as:  Revatio Take 1 tablet (20 mg total) by mouth as needed. Take 1-5 tabs as needed prior to intercourse       Allergies:  Allergies  Allergen Reactions  . Nsaids Other (See Comments)    Hx of ulcer    Family History: Family History  Problem Relation Age of Onset  . Heart attack Father   . Prostatitis Neg Hx   . Prostate cancer Neg Hx   .  Kidney cancer Neg Hx   . Bladder Cancer Neg Hx     Social History:  reports that he has never smoked. He has never used smokeless tobacco. He reports current alcohol use. He reports that he does not use drugs.  ROS: UROLOGY Frequent Urination?: Yes Hard to postpone urination?: No Burning/pain with urination?: No Get up at night to urinate?: Yes Leakage of urine?: No Urine stream starts and stops?: No Trouble starting stream?: No Do you have to strain to urinate?: No Blood in urine?: No Urinary tract infection?: No Sexually transmitted disease?: No Injury to kidneys or bladder?: No Painful intercourse?: No Weak stream?: No Erection problems?: Yes Penile pain?: No  Gastrointestinal Nausea?: No Vomiting?: No Indigestion/heartburn?: No Diarrhea?: No Constipation?: No  Constitutional Fever: No Night sweats?: No Weight loss?: No Fatigue?: No  Skin Skin rash/lesions?: No Itching?: No  Eyes Blurred vision?: No Double vision?: No  Ears/Nose/Throat Sore throat?: No Sinus problems?: No  Hematologic/Lymphatic Swollen glands?: No Easy bruising?: No  Cardiovascular Leg swelling?: No Chest pain?: No  Respiratory Cough?: No Shortness of breath?: No  Endocrine Excessive thirst?: No  Musculoskeletal Back pain?: No Joint pain?: No  Neurological Headaches?: No Dizziness?: No  Psychologic Depression?: No Anxiety?: No  Physical Exam: BP 127/68   Pulse 78   Ht 5\' 10"  (1.778 m)   Wt 268 lb (121.6 kg)   BMI 38.45 kg/m   Constitutional:  Alert and oriented, No acute distress. HEENT: [place] AT, moist mucus  membranes.  Trachea midline, no masses. Cardiovascular: No clubbing, cyanosis, or edema. Respiratory: Normal respiratory effort, no increased work of breathing. GI: Abdomen is soft, nontender, nondistended, no abdominal masses GU: No CVA tenderness Rectal: Normal sphincter tone.  30 cc prostate, nontender no nodules.. Skin: No rashes, bruises or suspicious lesions. Neurologic: Grossly intact, no focal deficits, moving all 4 extremities. Psychiatric: Normal mood and affect.  Laboratory Data: Lab Results  Component Value Date   WBC 9.6 10/03/2017   HGB 16.6 10/03/2017   HCT 47.2 10/03/2017   MCV 89.8 10/03/2017   PLT 257 10/03/2017    Lab Results  Component Value Date   CREATININE 1.07 10/03/2017    Urinalysis    Component Value Date/Time   COLORURINE YELLOW 12/30/2018 1141   APPEARANCEUR CLEAR 12/30/2018 1141   LABSPEC 1.020 12/30/2018 1141   PHURINE 8.0 12/30/2018 1141   GLUCOSEU NEGATIVE 12/30/2018 1141   HGBUR NEGATIVE 12/30/2018 1141   [place] 12/30/2018 1141   [place] 12/30/2018 1141   PROTEINUR NEGATIVE 12/30/2018 1141   NITRITE NEGATIVE 12/30/2018 1141   LEUKOCYTESUR NEGATIVE 12/30/2018 1141    Lab Results  Component Value Date   BACTERIA FEW (A) 12/30/2018    Pertinent Imaging:  KUB pending  Assessment & Plan:     1. Recurrent nephrolithiasis KUB today to assess overall stone burden  24-hour urine reviewed extensively today.  Urine output has dropped off significantly compared to last year which have encouraged him to continue to drink fluids.  Unfortunately, he has baseline urinary frequency and urgency and drinking more fluids exacerbates his urinary symptoms.  He understands he will find a balance.  Continue indapamide 12.5 mg as this seems to be decreasing his urinary calcium.  Consider the addition of potassium citrate, however his urinary citrate's are quite good thus role for this is questionable.  May consider sending  him for dietary counseling for low oxalate diet.  We will discuss this further at next visit. - Abdomen 1 view (  KUB); Future - Urinalysis, Complete w Microscopic; Future  2. Urinary frequency Symptoms poorly controlled despite finasteride and Myrbetriq 50 mg  Likely multifactorial.  Would recommend cystoscopy to rule out any underlying anatomic issues.  He is agreeable this plan.  This is relatively small thus it is unclear whether or not BPH is playing a role in his urinary symptoms.  May be related to diabetes, obesity, fluids, etc.  Continue finasteride and Myrbetriq for the time being, will adjust as deemed appropriate.  Briefly mentioned urodynamics today but he seems hesitant to want to pursue this.  3. Low urine output As above  4. Hypercalciuria As above   Schedule cysto  [name], MD  [facility] [address] [place], [place] [address] [phone]  I spent 25 min with this patient of which greater than 50% was spent in counseling and coordination of care with the patient.

## 2019-01-20 ENCOUNTER — Encounter: Payer: Self-pay | Admitting: Urology

## 2019-01-20 ENCOUNTER — Other Ambulatory Visit: Payer: Self-pay

## 2019-01-20 ENCOUNTER — Ambulatory Visit (INDEPENDENT_AMBULATORY_CARE_PROVIDER_SITE_OTHER): Payer: BC Managed Care – PPO | Admitting: Urology

## 2019-01-20 VITALS — BP 126/72 | HR 86 | Ht 70.0 in | Wt 270.0 lb

## 2019-01-20 DIAGNOSIS — N401 Enlarged prostate with lower urinary tract symptoms: Secondary | ICD-10-CM | POA: Diagnosis not present

## 2019-01-20 NOTE — Progress Notes (Signed)
   01/20/19  CC:  Chief Complaint  Patient presents with  . Cysto    HPI: 58 year old male with refractory urinary symptoms who presents today for cystoscopic evaluation.  Please see previous notes for details.  Prostate volume is 10 CT scan from 2018 reveals a prostate 5.18 x 4.87 x 3.31 cm = 54 cc (bullet) prior to finasteride.    He is currently on finasteride and Myrbetriq 50 mg.  Blood pressure 126/72, pulse 86, height 5\' 10"  (1.778 m), weight 270 lb (122.5 kg). NED. A&Ox3.   No respiratory distress   Abd soft, NT, ND Normal phallus with bilateral descended testicles  Cystoscopy Procedure Note  Patient identification was confirmed, informed consent was obtained, and patient was prepped using Betadine solution.  Lidocaine jelly was administered per urethral meatus.     Pre-Procedure: - Inspection reveals a normal caliber ureteral meatus.  Procedure: The flexible cystoscope was introduced without difficulty - No urethral strictures/lesions are present. - Enlarged prostate trilobar coaptation - Mildly elevated bladder neck - Bilateral ureteral orifices identified - Bladder mucosa  reveals no ulcers, tumors, or lesions - No bladder stones -Mild trabeculation  Retroflexion shows slight intravesical protrusion without a discrete median lobe  Post-Procedure: - Patient tolerated the procedure well  Assessment/ Plan:  1. Benign prostatic hyperplasia with lower urinary tract symptoms, symptom details unspecified Cystoscopic findings today with mild obstructive appearance  Given his mixed urinary symptoms, history of diabetes and other compounding factors, is unclear whether or not he is obstructed or has OAB type symptoms or combination of the 2.  We discussed options regarding further therapy including trial of various additional medications, pursuing outlet procedure or consideration of urodynamics.  We discussed urodynamics at length including the nature of the  procedure and evaluation findings.  Discussed today that this can be more definitive in diagnosing OAB versus obstruction versus a combination of the 2 I can help guide further future therapy/surgical intervention.  After this discussion, he is interested in pursuing this.    We will set him up for this in [place] which he would like to do after the new year.  I will see him back around February to discuss results.  He is agreeable this plan. - Ambulatory referral to Urology   Hollice Espy, MD

## 2019-02-01 ENCOUNTER — Other Ambulatory Visit: Payer: Self-pay | Admitting: Urology

## 2019-03-17 ENCOUNTER — Other Ambulatory Visit: Payer: Self-pay | Admitting: Family Medicine

## 2019-03-17 MED ORDER — MIRABEGRON ER 50 MG PO TB24: ORAL_TABLET | ORAL | 6 refills | Status: DC

## 2019-03-20 ENCOUNTER — Other Ambulatory Visit: Payer: Self-pay | Admitting: Urology

## 2019-03-21 ENCOUNTER — Ambulatory Visit: Payer: BC Managed Care – PPO | Admitting: Urology

## 2019-04-21 ENCOUNTER — Ambulatory Visit: Payer: BC Managed Care – PPO | Admitting: Urology

## 2019-05-18 NOTE — Progress Notes (Signed)
05/19/19 8:18 AM   Joe Dunlap Jul 01, 1960 [id number]  Referring provider: Marina Goodell, MD [address] [place],  [place] [address]  Chief Complaint  Patient presents with  . Benign Prostatic Hypertrophy    HPI: Joe Dunlap is a 59 y.o. white M with a hx of BPH with LUTS, recurrent nephrolithiasis and diabetes returns today for Urodynamics results.   Stones Pre-2018 s/p ESWL x 1 in remotepast S/p sucessful right ESWL for a 6 mm proximal ureteral calculus on 2/18  His stone analysis from 07/10/16 indicated stone composition of calcium oxalate monohydrate 70%, calcium oxalate dihydrate 27%, calcium phosphate 3%.  KUB shows bilateral stones, up to 9 mm in the [facility] 2019  He had a 24 urine study in 06/28/17 which indicated adequate urine output and elevated urinary calcium levels.  He returned to office on 12/30/18 with another 24 hour urine done which indicated decreased urine output down to 1.89 from previous 2.5 L.  He had  some improvement in his urinary calcium levels albeit still markedly elevated to 465 mg/day, previously 591 mg/day.  His urinary oxalate level continued to remain elevated 71 mg/day, higher than previous.  Urinary citrate was excellent 1420 mg/day.  He discontinued use of indapamide 2.5 mg for stone prevention since he needed refills.   No recent stone episodes.  BPH with LUTS CT scan from 2018 reveals a prostate 5.18 x 4.87 x 3.31 cm = 54 cc (bullet) prior to finasteride.   He presented on 11/20 for a cysto which indicated enlarged prostate tirlobar coaptation along with slight intravesical protrusion w/o a discrete median lobe.   Most recent PSA from 11/21/2018 0.6.   Since he had mixed urinary symptoms with no medication relief he underwent a UDS at [facility] on 03/17/19. His UDS indicated for sensation at 282 mL.  Max capacity 272.  Normal desire to void at 295 cc.  Strong desire at 370 mL.  Bladder instability was noted on multiple  occasions, sensation and inhibition of the contractions without leaking was noted.  He was able to void spontaneously, 236 mL at 6 mL/s.  Peak flow was a 52 cm of water, max detrusor pressure 59 cm of water.  PVR was 135.  He did have some EMG activity noted occasionally during flow voiding.  Fluoroscopy revealed trabeculated bladder with left-sided diverticulum.  Patient fell into the obstructed range.  His urinary symptoms have not improved today. He was on finasteride and Myrbetriq 50 mg however needs refills for Myrbetriq 50 mg.  He is out of Myrbetriq so has not been taking this recently.  PMH: Past Medical History:  Diagnosis Date  . DDD (degenerative disc disease), lumbar   . GERD (gastroesophageal reflux disease)   . Headache    not intractable  . History of kidney stones   . Hyperlipemia   . Hypertension   . Impaired glucose tolerance   . Kidney stones 2018  . Labral tear of hip, degenerative   . Lumbar radiculitis   . Obesity (BMI 30-39.9)   . Shingles    left T6  . Sleep apnea   . Stomach ulcer     Surgical History: Past Surgical History:  Procedure Laterality Date  . COLONOSCOPY WITH PROPOFOL N/A 02/11/2018   Procedure: COLONOSCOPY WITH PROPOFOL;  Surgeon: Christena Deem, MD;  Location: [facility] ENDOSCOPY;  Service: Endoscopy;  Laterality: N/A;  . EXTRACORPOREAL SHOCK WAVE LITHOTRIPSY Right 06/25/2016   Procedure: EXTRACORPOREAL SHOCK WAVE LITHOTRIPSY (ESWL);  Surgeon: [name], MD;  Location: [facility] ORS;  Service: Urology;  Laterality: Right;  . GASTRIC ULCER REPAIR    . KNEE SURGERY Left   . LEFT HEART CATH AND CORONARY ANGIOGRAPHY Left 05/11/2018   Procedure: LEFT HEART CATH AND CORONARY ANGIOGRAPHY;  Surgeon: Marcina Millard, MD;  Location: [facility] INVASIVE CV LAB;  Service: Cardiovascular;  Laterality: Left;  . TONSILLECTOMY    . UPPER GI ENDOSCOPY  2008   stomach ulcer  . UVULOPALATOPHARYNGOPLASTY (UPPP)/TONSILLECTOMY/SEPTOPLASTY  2000    Home  Medications:  Allergies as of 05/19/2019      Reactions   Nsaids Other (See Comments)   Hx of ulcer      Medication List       Accurate as of May 19, 2019 11:59 PM. If you have any questions, ask your nurse or doctor.        STOP taking these medications   finasteride 5 MG tablet Commonly known as: PROSCAR Stopped by: [name], MD     TAKE these medications   amLODipine 5 MG tablet Commonly known as: NORVASC Take 5 mg by mouth at bedtime.   aspirin 81 MG EC tablet Take 1 tablet (81 mg total) by mouth daily. Swallow whole. What changed: when to take this   indapamide 2.5 MG tablet Commonly known as: LOZOL Take 1 tablet (2.5 mg total) by mouth daily.   metoprolol succinate 25 MG 24 hr tablet Commonly known as: TOPROL-XL Take 25 mg by mouth daily.   mirabegron ER 50 MG Tb24 tablet Commonly known as: Myrbetriq TAKE 1 TABLET(50 MG) BY MOUTH DAILY   mometasone 50 MCG/ACT nasal spray Commonly known as: NASONEX Place 2 sprays into the nose daily as needed (allergies/congestion.).   pravastatin 40 MG tablet Commonly known as: PRAVACHOL Take 40 mg by mouth at bedtime.   sildenafil 20 MG tablet Commonly known as: Revatio Take 1 tablet (20 mg total) by mouth as needed. Take 1-5 tabs as needed prior to intercourse       Allergies:  Allergies  Allergen Reactions  . Nsaids Other (See Comments)    Hx of ulcer    Family History: Family History  Problem Relation Age of Onset  . Heart attack Father   . Prostatitis Neg Hx   . Prostate cancer Neg Hx   . Kidney cancer Neg Hx   . Bladder Cancer Neg Hx     Social History:  reports that he has never smoked. He has never used smokeless tobacco. He reports current alcohol use. He reports that he does not use drugs.   Physical Exam: BP (!) 147/75   Pulse 79   Ht 5\' 10"  (1.778 m)   Wt 267 lb (121.1 kg)   BMI 38.31 kg/m   Constitutional:  Alert and oriented, No acute distress. HEENT: [place] AT, moist mucus  membranes.  Trachea midline, no masses. Cardiovascular: No clubbing, cyanosis, or edema. Respiratory: Normal respiratory effort, no increased work of breathing. Skin: No rashes, bruises or suspicious lesions. Neurologic: Grossly intact, no focal deficits, moving all 4 extremities. Psychiatric: Normal mood and affect.  Laboratory Data: UDS reviewed. See epic.   Assessment & Plan:    1. BPH with urinary obstruction  UA/ urine culture sent today - pending   Evidene of outlet obstruction and would likely benefit from surgical procedure given gland size and prostate anatomy. Good candiddate for Urolift, HoLEP or TURP.  We discussed alternatives including TURP vs. holmium laser enucleation of the prostate vs. Urolift.  Differences between the surgical procedures were discussed as well as the risks and benefits of each.   He is most interested in UroLift due to his concern for ejaculatory issues.  Explained to pt he is a good candidate for Urolift.  Risks of the procedure reviewed extensively including discussed blood in urine, infection and possible ineffectiveness of procedure.  We also discussed the postoperative course, possibility of need for further procedures, stone formation, catheter etc.  All questions answered.  Is willing to proceed as planned.    2. OAB/detrusor instability Demonstrated on UDS  Pt discontinued Myrbetriq 50 mg since he needed refills  Continue Myrbetriq 50 mg, refills given  Continue finasteride   3. Stones Pt discontinued indapamide 2.5 mg since he needed refills  Encouraged to continue use of indapamide 2.5 mg for stone prevention, refills given    [place] [address], [place], [place] [address] [phone]  I, Lucas Mallow, am acting as a scribe for Dr. Hollice Espy,  I have reviewed the above documentation for accuracy and completeness, and I agree with the above.   Hollice Espy, MD  I spent 45  total minutes on the day of the encounter including pre-visit review of the medical record, face-to-face time with the patient, and post visit ordering of labs/imaging/tests.

## 2019-05-19 ENCOUNTER — Telehealth: Payer: Self-pay | Admitting: Urology

## 2019-05-19 ENCOUNTER — Other Ambulatory Visit: Payer: Self-pay

## 2019-05-19 ENCOUNTER — Other Ambulatory Visit
Admission: RE | Admit: 2019-05-19 | Discharge: 2019-05-19 | Disposition: A | Discharge status: Home / Self Care | Payer: BC Managed Care – PPO | Attending: Urology | Admitting: Urology

## 2019-05-19 ENCOUNTER — Ambulatory Visit: Payer: BC Managed Care – PPO | Admitting: Urology

## 2019-05-19 VITALS — BP 147/75 | HR 79 | Ht 70.0 in | Wt 267.0 lb

## 2019-05-19 DIAGNOSIS — N401 Enlarged prostate with lower urinary tract symptoms: Secondary | ICD-10-CM | POA: Insufficient documentation

## 2019-05-19 LAB — URINALYSIS, COMPLETE (UACMP) WITH MICROSCOPIC
Bilirubin Urine: NEGATIVE
Glucose, UA: NEGATIVE mg/dL
Hgb urine dipstick: NEGATIVE
Ketones, ur: NEGATIVE mg/dL
Leukocytes,Ua: NEGATIVE
Nitrite: NEGATIVE
Protein, ur: NEGATIVE mg/dL
Specific Gravity, Urine: 1.02 (ref 1.005–1.030)
pH: 7 (ref 5.0–8.0)

## 2019-05-19 MED ORDER — MIRABEGRON ER 50 MG PO TB24: ORAL_TABLET | ORAL | 6 refills | Status: DC

## 2019-05-19 MED ORDER — INDAPAMIDE 2.5 MG PO TABS: 2.5000 mg | ORAL_TABLET | Freq: Every day | ORAL | 11 refills | Status: DC

## 2019-05-19 NOTE — Telephone Encounter (Signed)
This patient is interested in UroLift procedure.  Please call him today before 2:00 or lunchtime on Monday per his preference to schedule.  [name], MD

## 2019-05-20 LAB — URINE CULTURE: Culture: <10000 — AB

## 2019-05-22 ENCOUNTER — Telehealth: Payer: Self-pay | Admitting: Radiology

## 2019-05-22 NOTE — Telephone Encounter (Signed)
There is no indication for Covid testing.  Anywhere in the near future.  Please explore the barriers with him about why he is apprehensive.  Please reassure him.  [name], MD

## 2019-05-22 NOTE — Telephone Encounter (Signed)
LMOM to return call.

## 2019-05-22 NOTE — Telephone Encounter (Signed)
Patient called to cancel Urolift planned with Dr Apolinar Junes until a COVID test and quarantine are not required prior to surgery. Dr Apolinar Junes - would you like to make a follow up appointment?

## 2019-05-25 NOTE — Telephone Encounter (Signed)
Tell him to wear a mask and please book the case.    [name], MD

## 2019-05-25 NOTE — Telephone Encounter (Signed)
Patient's concern is that he can't social distance at work after having the covid test. Please advise.

## 2019-05-25 NOTE — Telephone Encounter (Signed)
LMOM

## 2019-05-26 ENCOUNTER — Other Ambulatory Visit: Payer: Self-pay | Admitting: Radiology

## 2019-05-26 DIAGNOSIS — N401 Enlarged prostate with lower urinary tract symptoms: Secondary | ICD-10-CM

## 2019-05-26 DIAGNOSIS — N138 Other obstructive and reflux uropathy: Secondary | ICD-10-CM

## 2019-05-26 NOTE — Telephone Encounter (Signed)
LMOM to return call.

## 2019-05-26 NOTE — Telephone Encounter (Signed)
Discussed wearing mask while at work after having the COVID test. Patient would like to wait until next Urolift day on 08/14/2019 to schedule procedure.

## 2019-06-05 ENCOUNTER — Ambulatory Visit: Admit: 2019-06-05 | Payer: BC Managed Care – PPO | Admitting: Urology

## 2019-06-05 SURGERY — CYSTOSCOPY WITH INSERTION OF UROLIFT
Anesthesia: Choice

## 2019-07-17 ENCOUNTER — Telehealth: Payer: Self-pay | Admitting: Radiology

## 2019-07-17 NOTE — Telephone Encounter (Signed)
LMOM to return call. Need to discuss upcoming surgery with Dr Brandon. 

## 2019-07-18 ENCOUNTER — Other Ambulatory Visit: Payer: Self-pay | Admitting: Radiology

## 2019-07-18 DIAGNOSIS — N401 Enlarged prostate with lower urinary tract symptoms: Secondary | ICD-10-CM

## 2019-07-18 NOTE — Telephone Encounter (Signed)
Discussed appointments and instructions related to Urolift insertion scheduled with Dr Apolinar Junes on 08/14/2019. Patient verbalized understanding.

## 2019-08-03 ENCOUNTER — Other Ambulatory Visit: Payer: Self-pay | Admitting: Radiology

## 2019-08-04 ENCOUNTER — Other Ambulatory Visit
Admission: RE | Admit: 2019-08-04 | Discharge: 2019-08-04 | Disposition: A | Discharge status: Home / Self Care | Payer: BC Managed Care – PPO | Attending: Urology | Admitting: Urology

## 2019-08-04 ENCOUNTER — Encounter
Admission: RE | Admit: 2019-08-04 | Discharge: 2019-08-04 | Disposition: A | Discharge status: Home / Self Care | Payer: BC Managed Care – PPO | Source: Ambulatory Visit | Attending: Urology | Admitting: Urology

## 2019-08-04 ENCOUNTER — Other Ambulatory Visit: Payer: Self-pay

## 2019-08-04 DIAGNOSIS — N401 Enlarged prostate with lower urinary tract symptoms: Secondary | ICD-10-CM | POA: Insufficient documentation

## 2019-08-04 DIAGNOSIS — N138 Other obstructive and reflux uropathy: Secondary | ICD-10-CM | POA: Insufficient documentation

## 2019-08-04 HISTORY — DX: Atherosclerotic heart disease of native coronary artery without angina pectoris: I25.10

## 2019-08-04 LAB — URINALYSIS, COMPLETE (UACMP) WITH MICROSCOPIC
Bacteria, UA: NONE SEEN
Bilirubin Urine: NEGATIVE
Glucose, UA: NEGATIVE mg/dL
Ketones, ur: NEGATIVE mg/dL
Leukocytes,Ua: NEGATIVE
Nitrite: NEGATIVE
Protein, ur: NEGATIVE mg/dL
Specific Gravity, Urine: 1.02 (ref 1.005–1.030)
Squamous Epithelial / HPF: NONE SEEN (ref 0–5)
pH: 7 (ref 5.0–8.0)

## 2019-08-04 NOTE — Pre-Procedure Instructions (Signed)
CARDIAC CLEARANCE ON CHART-LOW RISK 

## 2019-08-04 NOTE — Pre-Procedure Instructions (Signed)
Progress Notes - documented in this encounter Fath, Glenetta Borg, MD - 11/30/2018 3:30 PM EDT Formatting of this note might be different from the original.   Chief Complaint: Chief Complaint  Patient presents with  . not felling good lately  occasional cp and left arm pain  Date of Service: 11/30/2018 Date of Birth: Dec 11, 1960 PCP: Janann Colonel., MD  History of Present Illness: Mr. Joe Dunlap is a 59 y.o.male patient who has a past medical history significant for essential hypertension, impaired glucose intolerance, hyperlipidemia, obstructive sleep apnea, and obesity who presents for chest pain. He has a strong family history of heart disease. Patient reports a few month history of intermittent, dull, substernal chest pain that radiates down the left arm. Episodes last several hours on average and typically occur at rest. No associated shortness of breath or diaphoresis. Denies shortness of breath, palpitations, or lower extremity swelling. Is fairly active at work without difficulty. He had a negative ER evaluation after presenting earlier last year. He underwent a functional study which showed no high-grade ischemia although still has exertional chest discomfort consistent with possible angina as it radiates to his left arm. Due to continued symptoms he underwent CT with CT FFR in February of this year which revealed some evidence of evidence of significant coronary disease. Based on this he underwent left heart cath in March of this year which revealed 75% stenosis and an acute marginal not amenable to PCI due to size. Normal LV function. Incidental disease elsewhere. He has some fatigue and chest tightness but this is improved from previously.  Past Medical and Surgical History  Past Medical History Past Medical History:  Diagnosis Date  . GERD (gastroesophageal reflux disease)  . Hyperlipidemia  . Hypertension  . Impaired glucose tolerance  . Kidney stones  . Sleep apnea  .  Ulcer   Past Surgical History He has a past surgical history that includes Tonsillectomy; Left knee arthroscopy; Colonoscopy (07/04/2012); Upper gastrointestinal endoscopy; Nasal septoplasty 2000; Gastric ulcer repair (November 2008); and Colonoscopy (02/11/2018).   Medications and Allergies  Current Medications  Current Outpatient Medications  Medication Sig Dispense Refill  . amLODIPine (NORVASC) 5 MG tablet TAKE 1 TABLET BY MOUTH EVERY DAY 90 tablet 3  . aspirin 81 MG EC tablet Take 81 mg by mouth once daily  . cinnamon bark (CINNAMON ORAL) Take by mouth  . cranberry fruit extract (CRANBERRY ORAL) Take by mouth  . finasteride (PROSCAR) 5 mg tablet  . indapamide (LOZOL) 2.5 MG tablet  . mirabegron (MYRBETRIQ) 50 mg ER tablet Take by mouth  . multivitamin tablet Take 1 tablet by mouth once daily.  . pravastatin (PRAVACHOL) 40 MG tablet TAKE 1 TABLET BY MOUTH AT BEDTIME 90 tablet 3  . metoprolol succinate (TOPROL-XL) 25 MG XL tablet Take 1 tablet (25 mg total) by mouth once daily 30 tablet 11   No current facility-administered medications for this visit.   Allergies: Nsaids (non-steroidal anti-inflammatory drug)  Social and Family History  Social History reports that he has never smoked. He has never used smokeless tobacco. He reports current alcohol use. He reports that he does not use drugs.  Family History Family History  Problem Relation Age of Onset  . Breast cancer Mother  . Alcohol abuse Father  . High blood pressure (Hypertension) Father  . Aneurysm Father  Ruptured aortic aneurysm  . Ulcers Paternal Uncle  . Bipolar disorder Sister   Review of Systems  Review of Systems  Constitutional: Negative for chills,  diaphoresis, fever, malaise/fatigue and weight loss.  HENT: Negative for congestion, ear discharge, hearing loss and tinnitus.  Eyes: Negative for blurred vision.  Respiratory: Negative for cough, hemoptysis, sputum production, shortness of breath and  wheezing.  Cardiovascular: Positive for chest pain. Negative for palpitations, orthopnea, claudication, leg swelling and PND.  Gastrointestinal: Negative for abdominal pain, blood in stool, constipation, diarrhea, heartburn, melena, nausea and vomiting.  Genitourinary: Negative for dysuria, frequency, hematuria and urgency.  Musculoskeletal: Positive for joint pain. Negative for back pain, falls and myalgias.  Skin: Negative for itching and rash.  Neurological: Negative for dizziness, tingling, focal weakness, loss of consciousness, weakness and headaches.  Endo/Heme/Allergies: Negative for polydipsia. Does not bruise/bleed easily.  Psychiatric/Behavioral: Negative for depression, memory loss and substance abuse. The patient is not nervous/anxious.   Physical Examination   Vitals:BP 144/88  Pulse 94  Resp 16  Ht 177.8 cm (5\' 10" )  Wt (!) 122.9 kg (271 lb)  SpO2 98%  BMI 38.88 kg/m  Ht:177.8 cm (5\' 10" ) Wt:(!) 122.9 kg (271 lb) surface area is 2.46 meters squared. Body mass index is 38.88 kg/m.  Wt Readings from Last 3 Encounters:  11/30/18 (!) 122.9 kg (271 lb)  04/29/18 (!) 122.1 kg (269 lb 2.9 oz)  03/21/18 (!) 122.9 kg (271 lb)   BP Readings from Last 3 Encounters:  11/30/18 144/88  04/29/18 140/82  04/25/18 111/53   General appearance appears in no acute distress  Head Mouth and Eye exam Normocephalic, without obvious abnormality, atraumatic Dentition is good Eyes appear anicteric       LUNGS Breath Sounds: Normal Percussion: Normal  CARDIOVASCULAR JVP CV wave: no HJR: no Elevation at 90 degrees: None Carotid Pulse: normal pulsation bilaterally Bruit: None Apex: apical impulse normal  Auscultation Rhythm: normal sinus rhythm S1: normal S2: normal Clicks: no Rub: no Murmurs: no murmurs  Gallop: None  EXTREMITIES Clubbing: no Edema: trace to 1+ bilateral pedal edema Pulses: peripheral pulses symmetrical Femoral Bruits:  no Amputation: no SKIN Rash: no Cyanosis: no Embolic phemonenon: no Bruising: no NEURO Alert and Oriented to person, place and time: yes Non focal: yes  PSYCH: Pt appears to have normal affect  LABS REVIEWED Last 3 CBC results: Lab Results  Component Value Date  WBC 8.3 04/29/2018  WBC 9.0 09/06/2016  WBC 7.7 07/31/2013   Lab Results  Component Value Date  HGB 15.6 04/29/2018  HGB 15.4 09/06/2016  HGB 14.6 07/31/2013   Lab Results  Component Value Date  HCT 45.5 04/29/2018  HCT 43.7 09/06/2016  HCT 41.9 07/31/2013   Lab Results  Component Value Date  PLT 213 04/29/2018  PLT 241 09/06/2016  PLT 241 07/31/2013   Lab Results  Component Value Date  CREATININE 1.0 04/29/2018  BUN 18 04/29/2018  NA 139 04/29/2018  K 4.2 04/29/2018  CL 103 04/29/2018  CO2 27.7 04/29/2018   Lab Results  Component Value Date  HGBA1C 6.0 (H) 09/27/2017   Lab Results  Component Value Date  HDL 44.2 09/27/2017  HDL 41.0 02/12/2017  HDL 45.5 08/12/2016   Lab Results  Component Value Date  LDLCALC 74 09/27/2017  LDLCALC 88 02/12/2017  LDLCALC 75 08/12/2016   Lab Results  Component Value Date  TRIG 201 (H) 09/27/2017  TRIG 135 02/12/2017  TRIG 149 08/12/2016   Lab Results  Component Value Date  ALT 26 09/27/2017  AST 18 09/27/2017  ALKPHOS 47 09/27/2017   No results found for: TSH  Assessment and Plan   59 y.o. male  with  ICD-10-CM  1. Hypertension, essential, benign-well controlled on current meds. Low sodium and continue to follow. I10  2. Gastroesophageal reflux disease without esophagitis K21.9  3. Atypical chest pain-High risk for cad with no significant cad on functional study and persistent exertional symptoms. CT angiogram revealed evidence of possible significant disease. Left heart cath revealed a 700% stenosis in a small acute marginal otherwise unremarkable. Medical management was recommended. He is fairly stable at present symptoms are fairly atypical.  We will continue with current regimen and follow. R07.89     Return in about 6 months (around 05/30/2019).  These notes generated with voice recognition software. I apologize for typographical errors.  Sydnee Levans, MD    Electronically signed by Sydnee Levans, MD at 12/01/2018 4:24 PM EDT

## 2019-08-04 NOTE — Pre-Procedure Instructions (Signed)
ECG 12-lead9/30/2020 [facility] System Component Name Value Ref Range  Vent Rate (bpm) 88   PR Interval (msec) 152   QRS Interval (msec) 90   QT Interval (msec) 372   QTc (msec) 450   Other Result Information  This result has an attachment that is not available.  Result Narrative  Normal sinus rhythm Normal ECG When compared with ECG of 06-Sep-2016 17:24, No significant change was found I reviewed and concur with this report. Electronically signed HW:KGSU MD, KEN ([id number]) on 12/06/2018 12:48:18 PM  Status Results Details

## 2019-08-04 NOTE — Patient Instructions (Signed)
Your procedure is scheduled on: 08-14-19 [facility] Report to Same Day Surgery 2nd floor medical mall [facility] Entrance-take elevator on left to 2nd floor.  Check in with surgery information desk.) To find out your arrival time please call [phone] between 1PM - 3PM on 08-11-19 FRIDAY  Remember: Instructions that are not followed completely may result in serious medical risk, up to and including death, or upon the discretion of your surgeon and anesthesiologist your surgery may need to be rescheduled.    _x___ 1. Do not eat food after midnight the night before your procedure. NO GUM OR CANDY AFTER MIDNIGHT. You may drink clear liquids up to 2 hours before you are scheduled to arrive at the hospital for your procedure.  Do not drink clear liquids within 2 hours of your scheduled arrival to the hospital.  Clear liquids include  --Water or Apple juice without pulp  --Gatorade  --Black Coffee or Clear Tea (No milk, no creamers, do not add anything to the coffee or Tea-ok to add sugar)   ____Ensure clear carbohydrate drink on the way to the hospital for bariatric patients  ____Ensure clear carbohydrate drink 3 hours before surgery.    __x__ 2. No Alcohol for 24 hours before or after surgery.   __x__3. No Smoking or e-cigarettes for 24 prior to surgery.  Do not use any chewable tobacco products for at least 6 hour prior to surgery   ____  4. Bring all medications with you on the day of surgery if instructed.    __x__ 5. Notify your doctor if there is any change in your medical condition     (cold, fever, infections).    x___6. On the morning of surgery brush your teeth with toothpaste and water.  You may rinse your mouth with mouth wash if you wish.  Do not swallow any toothpaste or mouthwash.   Do not wear jewelry, make-up, hairpins, clips or nail polish.  Do not wear lotions, powders, or perfumes. You may wear deodorant.  Do not shave 48 hours prior to surgery. Men may shave face  and neck.  Do not bring valuables to the hospital.    [facility] is not responsible for any belongings or valuables.               Contacts, dentures or bridgework may not be worn into surgery.  Leave your suitcase in the car. After surgery it may be brought to your room.  For patients admitted to the hospital, discharge time is determined by your treatment team.  _  Patients discharged the day of surgery will not be allowed to drive home.  You will need someone to drive you home and stay with you the night of your procedure.    Please read over the following fact sheets that you were given:   [facility] Preparing for Surgery  _x___ TAKE THE FOLLOWING MEDICATION THE MORNING OF SURGERY WITH A SMALL SIP OF WATER. These include:  1. PRILOSEC (OMEPRAZOLE)  2. TAKE AN EXTRA OMEPRAZOLE THE NIGHT BEFORE YOUR SURGERY  3.  4.  5.  6.  ____Fleets enema or Magnesium Citrate as directed.   ____ Use CHG Soap or sage wipes as directed on instruction sheet   ____ Use inhalers on the day of surgery and bring to hospital day of surgery  ____ Stop Metformin and Janumet 2 days prior to surgery.    ____ Take 1/2 of usual insulin dose the night before surgery and none on  the morning surgery.   _x___ Follow recommendations from Cardiologist, Pulmonologist or PCP regarding stopping Aspirin, Coumadin, Plavix ,Eliquis, Effient, or Pradaxa, and Pletal-ALREADY STOPPED ASPIRIN ON 07-27-19  X____Stop Anti-inflammatories such as Advil, Aleve, Ibuprofen, Motrin, Naproxen, Naprosyn, Goodies powders or aspirin products 7 DAYS PRIOR TO SURGERY-OK to take Tylenol   ____ Stop supplements until after surgery.     _X___ Bring C-Pap to the hospital.

## 2019-08-06 LAB — URINE CULTURE: Culture: NO GROWTH

## 2019-08-07 ENCOUNTER — Encounter
Admission: RE | Admit: 2019-08-07 | Discharge: 2019-08-07 | Disposition: A | Discharge status: Home / Self Care | Payer: BC Managed Care – PPO | Source: Ambulatory Visit | Attending: Urology | Admitting: Urology

## 2019-08-07 ENCOUNTER — Other Ambulatory Visit: Payer: Self-pay

## 2019-08-07 DIAGNOSIS — Z01818 Encounter for other preprocedural examination: Secondary | ICD-10-CM | POA: Diagnosis not present

## 2019-08-07 LAB — POTASSIUM: Potassium: 3.6 mmol/L (ref 3.5–5.1)

## 2019-08-10 ENCOUNTER — Other Ambulatory Visit
Admission: RE | Admit: 2019-08-10 | Discharge: 2019-08-10 | Disposition: A | Discharge status: Home / Self Care | Payer: BC Managed Care – PPO | Source: Ambulatory Visit | Attending: Urology | Admitting: Urology

## 2019-08-10 ENCOUNTER — Other Ambulatory Visit: Payer: Self-pay

## 2019-08-10 DIAGNOSIS — Z20822 Contact with and (suspected) exposure to covid-19: Secondary | ICD-10-CM | POA: Insufficient documentation

## 2019-08-10 DIAGNOSIS — Z01812 Encounter for preprocedural laboratory examination: Secondary | ICD-10-CM | POA: Insufficient documentation

## 2019-08-11 LAB — SARS CORONAVIRUS 2 (TAT 6-24 HRS): SARS Coronavirus 2: NEGATIVE

## 2019-08-14 ENCOUNTER — Encounter
Admission: RE | Disposition: A | Discharge status: Home / Self Care | Payer: Self-pay | Source: Home / Self Care | Attending: Urology

## 2019-08-14 ENCOUNTER — Ambulatory Visit
Admission: RE | Admit: 2019-08-14 | Discharge: 2019-08-14 | Disposition: A | Discharge status: Home / Self Care | Payer: BC Managed Care – PPO | Attending: Urology | Admitting: Urology

## 2019-08-14 ENCOUNTER — Other Ambulatory Visit: Payer: Self-pay

## 2019-08-14 ENCOUNTER — Encounter: Payer: Self-pay | Admitting: Urology

## 2019-08-14 ENCOUNTER — Ambulatory Visit: Payer: BC Managed Care – PPO | Admitting: Anesthesiology

## 2019-08-14 DIAGNOSIS — Z8711 Personal history of peptic ulcer disease: Secondary | ICD-10-CM | POA: Insufficient documentation

## 2019-08-14 DIAGNOSIS — Z8249 Family history of ischemic heart disease and other diseases of the circulatory system: Secondary | ICD-10-CM | POA: Diagnosis not present

## 2019-08-14 DIAGNOSIS — M5136 Other intervertebral disc degeneration, lumbar region: Secondary | ICD-10-CM | POA: Insufficient documentation

## 2019-08-14 DIAGNOSIS — Z79899 Other long term (current) drug therapy: Secondary | ICD-10-CM | POA: Diagnosis not present

## 2019-08-14 DIAGNOSIS — E785 Hyperlipidemia, unspecified: Secondary | ICD-10-CM | POA: Insufficient documentation

## 2019-08-14 DIAGNOSIS — I10 Essential (primary) hypertension: Secondary | ICD-10-CM | POA: Insufficient documentation

## 2019-08-14 DIAGNOSIS — N138 Other obstructive and reflux uropathy: Secondary | ICD-10-CM | POA: Insufficient documentation

## 2019-08-14 DIAGNOSIS — N401 Enlarged prostate with lower urinary tract symptoms: Secondary | ICD-10-CM | POA: Insufficient documentation

## 2019-08-14 DIAGNOSIS — Z87442 Personal history of urinary calculi: Secondary | ICD-10-CM | POA: Insufficient documentation

## 2019-08-14 DIAGNOSIS — E119 Type 2 diabetes mellitus without complications: Secondary | ICD-10-CM | POA: Diagnosis not present

## 2019-08-14 DIAGNOSIS — M5416 Radiculopathy, lumbar region: Secondary | ICD-10-CM | POA: Insufficient documentation

## 2019-08-14 DIAGNOSIS — R519 Headache, unspecified: Secondary | ICD-10-CM | POA: Insufficient documentation

## 2019-08-14 DIAGNOSIS — I2510 Atherosclerotic heart disease of native coronary artery without angina pectoris: Secondary | ICD-10-CM | POA: Diagnosis not present

## 2019-08-14 DIAGNOSIS — K219 Gastro-esophageal reflux disease without esophagitis: Secondary | ICD-10-CM | POA: Diagnosis not present

## 2019-08-14 DIAGNOSIS — G4730 Sleep apnea, unspecified: Secondary | ICD-10-CM | POA: Diagnosis not present

## 2019-08-14 DIAGNOSIS — Z888 Allergy status to other drugs, medicaments and biological substances status: Secondary | ICD-10-CM | POA: Insufficient documentation

## 2019-08-14 DIAGNOSIS — M1990 Unspecified osteoarthritis, unspecified site: Secondary | ICD-10-CM | POA: Insufficient documentation

## 2019-08-14 DIAGNOSIS — Z6837 Body mass index (BMI) 37.0-37.9, adult: Secondary | ICD-10-CM | POA: Diagnosis not present

## 2019-08-14 DIAGNOSIS — E669 Obesity, unspecified: Secondary | ICD-10-CM | POA: Diagnosis not present

## 2019-08-14 HISTORY — PX: CYSTOSCOPY WITH INSERTION OF UROLIFT: SHX6678

## 2019-08-14 SURGERY — CYSTOSCOPY WITH INSERTION OF UROLIFT
Anesthesia: General | Site: Prostate

## 2019-08-14 MED ORDER — LACTATED RINGERS IV SOLN: INTRAVENOUS | Status: DC

## 2019-08-14 MED ORDER — CEFAZOLIN SODIUM-DEXTROSE 2-4 GM/100ML-% IV SOLN
INTRAVENOUS | Status: AC
  Filled 2019-08-14: qty 100

## 2019-08-14 MED ORDER — LIDOCAINE HCL (CARDIAC) PF 100 MG/5ML IV SOSY
PREFILLED_SYRINGE | INTRAVENOUS | Status: DC | PRN
  Administered 2019-08-14: 100 mg via INTRAVENOUS

## 2019-08-14 MED ORDER — ONDANSETRON HCL 4 MG/2ML IJ SOLN
INTRAMUSCULAR | Status: DC | PRN
  Administered 2019-08-14: 4 mg via INTRAVENOUS

## 2019-08-14 MED ORDER — FENTANYL CITRATE (PF) 100 MCG/2ML IJ SOLN
INTRAMUSCULAR | Status: AC
  Filled 2019-08-14: qty 2

## 2019-08-14 MED ORDER — PHENYLEPHRINE HCL (PRESSORS) 10 MG/ML IV SOLN
INTRAVENOUS | Status: DC | PRN
  Administered 2019-08-14: 100 ug via INTRAVENOUS

## 2019-08-14 MED ORDER — MIDAZOLAM HCL 2 MG/2ML IJ SOLN
INTRAMUSCULAR | Status: AC
  Filled 2019-08-14: qty 2

## 2019-08-14 MED ORDER — HYDROCODONE-ACETAMINOPHEN 5-325 MG PO TABS: 1.0000 | ORAL_TABLET | Freq: Four times a day (QID) | ORAL | 0 refills | Status: DC | PRN

## 2019-08-14 MED ORDER — MIDAZOLAM HCL 2 MG/2ML IJ SOLN
INTRAMUSCULAR | Status: DC | PRN
  Administered 2019-08-14: 2 mg via INTRAVENOUS

## 2019-08-14 MED ORDER — PROPOFOL 10 MG/ML IV BOLUS
INTRAVENOUS | Status: DC | PRN
  Administered 2019-08-14: 180 mg via INTRAVENOUS

## 2019-08-14 MED ORDER — CEFAZOLIN SODIUM-DEXTROSE 2-4 GM/100ML-% IV SOLN
2.0000 g | INTRAVENOUS | Status: AC
  Administered 2019-08-14: 2 g via INTRAVENOUS

## 2019-08-14 MED ORDER — FENTANYL CITRATE (PF) 100 MCG/2ML IJ SOLN
INTRAMUSCULAR | Status: DC | PRN
  Administered 2019-08-14: 50 ug via INTRAVENOUS

## 2019-08-14 MED ORDER — CHLORHEXIDINE GLUCONATE 0.12 % MT SOLN: 15.0000 mL | Freq: Once | OROMUCOSAL | Status: AC

## 2019-08-14 MED ORDER — CHLORHEXIDINE GLUCONATE 0.12 % MT SOLN
OROMUCOSAL | Status: AC
  Administered 2019-08-14: 15 mL via OROMUCOSAL
  Filled 2019-08-14: qty 15

## 2019-08-14 MED ORDER — ORAL CARE MOUTH RINSE: 15.0000 mL | Freq: Once | OROMUCOSAL | Status: AC

## 2019-08-14 MED ORDER — DEXAMETHASONE SODIUM PHOSPHATE 10 MG/ML IJ SOLN
INTRAMUSCULAR | Status: DC | PRN
  Administered 2019-08-14: 10 mg via INTRAVENOUS

## 2019-08-14 SURGICAL SUPPLY — 12 items
BAG DRAIN CYSTO-URO LG1000N (MISCELLANEOUS) ×3 IMPLANT
GLOVE BIO SURGEON STRL SZ 6.5 (GLOVE) ×2 IMPLANT
GLOVE BIO SURGEONS STRL SZ 6.5 (GLOVE) ×1
GOWN STRL REUS W/ TWL LRG LVL3 (GOWN DISPOSABLE) ×2 IMPLANT
GOWN STRL REUS W/TWL LRG LVL3 (GOWN DISPOSABLE) ×4
KIT TURNOVER CYSTO (KITS) ×3 IMPLANT
PACK CYSTO AR (MISCELLANEOUS) ×3 IMPLANT
SET CYSTO W/LG BORE CLAMP LF (SET/KITS/TRAYS/PACK) ×3 IMPLANT
SURGILUBE 2OZ TUBE FLIPTOP (MISCELLANEOUS) IMPLANT
SYSTEM UROLIFT (Male Continence) ×15 IMPLANT
WATER STERILE IRR 1000ML POUR (IV SOLUTION) ×3 IMPLANT
WATER STERILE IRR 3000ML UROMA (IV SOLUTION) ×3 IMPLANT

## 2019-08-14 NOTE — Transfer of Care (Signed)
Immediate Anesthesia Transfer of Care Note  Patient: Joe Dunlap  Procedure(s) Performed: CYSTOSCOPY WITH INSERTION OF UROLIFT (N/A Prostate)  Patient Location: PACU  Anesthesia Type:General  Level of Consciousness: awake and sedated  Airway & Oxygen Therapy: Patient Spontanous Breathing and Patient connected to face mask oxygen  Post-op Assessment: Report given to RN and Post -op Vital signs reviewed and stable  Post vital signs: Reviewed and stable  Last Vitals:  Vitals Value Taken Time  BP 151/85 08/14/19 1505  Temp    Pulse 83 08/14/19 1510  Resp 11 08/14/19 1510  SpO2 100 % 08/14/19 1510  Vitals shown include unvalidated device data.  Last Pain:  Vitals:   08/14/19 1327  TempSrc: Temporal  PainSc:          Complications: No complications documented.

## 2019-08-14 NOTE — Discharge Instructions (Signed)

## 2019-08-14 NOTE — H&P (Signed)
H&P updated 08/14/19 No changes in medical history RRR CTAB   Joe Dunlap [facility] [birth year] [id number]  Referring provider: Marina Goodell, MD [address] [place],  [place] [address]     Chief Complaint  Patient presents with  . Benign Prostatic Hypertrophy    HPI: Joe Dunlap is a 59 y.o. white M with a hx of BPH with LUTS, recurrent nephrolithiasis and diabetes returns today for Urodynamics results.   Stones Pre-2018 s/p ESWL x 1 in remotepast S/p sucessful right ESWL for a 6 mm proximal ureteral calculus on 2/18  His stone analysis from 07/10/16 indicated stone composition of calcium oxalate monohydrate 70%, calcium oxalate dihydrate 27%, calcium phosphate 3%.  KUB shows bilateral stones, up to45mm in the LLP 2019  He had a 24 urine study in 06/28/17 which indicated adequate urine output and elevated urinary calcium levels.  He returned to office on 12/30/18 with another 24 hour urine done which indicated decreased urine output down to 1.89 from previous 2.5 L. He had  some improvement in his urinary calcium levels albeit still markedly elevated to 465 mg/day, previously 591 mg/day. His urinary oxalate level continued to remain elevated 71 mg/day, higher than previous. Urinary citrate was excellent 1420 mg/day.  He discontinued use of indapamide 2.5 mg for stone prevention since he needed refills.   No recent stone episodes.  BPH with LUTS CT scan from 2018 reveals a prostate 5.18 x 4.87 x 3.31cm = 54 cc (bullet) prior to finasteride.  He presented on 11/20 for a cysto which indicated enlarged prostate tirlobar coaptation along with slight intravesical protrusion w/o a discrete median lobe.   Most recent PSA from 11/21/2018 0.6.   Since he had mixed urinary symptoms with no medication relief he underwent a UDS at [facility] on 03/17/19. His UDS indicated for sensation at 282 mL.  Max capacity 272.  Normal desire to void at 295 cc.   Strong desire at 370 mL.  Bladder instability was noted on multiple occasions, sensation and inhibition of the contractions without leaking was noted.  He was able to void spontaneously, 236 mL at 6 mL/s.  Peak flow was a 52 cm of water, max detrusor pressure 59 cm of water.  PVR was 135.  He did have some EMG activity noted occasionally during flow voiding.  Fluoroscopy revealed trabeculated bladder with left-sided diverticulum.  Patient fell into the obstructed range.  His urinary symptoms have not improved today. He was on finasteride and Myrbetriq 50 mg however needs refills for Myrbetriq 50 mg.  He is out of Myrbetriq so has not been taking this recently.  PMH:     Past Medical History:  Diagnosis Date  . DDD (degenerative disc disease), lumbar   . GERD (gastroesophageal reflux disease)   . Headache    not intractable  . History of kidney stones   . Hyperlipemia   . Hypertension   . Impaired glucose tolerance   . Kidney stones 2018  . Labral tear of hip, degenerative   . Lumbar radiculitis   . Obesity (BMI 30-39.9)   . Shingles    left T6  . Sleep apnea   . Stomach ulcer     Surgical History:      Past Surgical History:  Procedure Laterality Date  . COLONOSCOPY WITH PROPOFOL N/A 02/11/2018   Procedure: COLONOSCOPY WITH PROPOFOL;  Surgeon: Christena Deem, MD;  Location: [facility] ENDOSCOPY;  Service: Endoscopy;  Laterality: N/A;  . EXTRACORPOREAL SHOCK  WAVE LITHOTRIPSY Right 06/25/2016   Procedure: EXTRACORPOREAL SHOCK WAVE LITHOTRIPSY (ESWL);  Surgeon: [name], MD;  Location: [facility] ORS;  Service: Urology;  Laterality: Right;  . GASTRIC ULCER REPAIR    . KNEE SURGERY Left   . LEFT HEART CATH AND CORONARY ANGIOGRAPHY Left 05/11/2018   Procedure: LEFT HEART CATH AND CORONARY ANGIOGRAPHY;  Surgeon: Marcina Millard, MD;  Location: [facility] INVASIVE CV LAB;  Service: Cardiovascular;  Laterality: Left;  . TONSILLECTOMY    . UPPER GI ENDOSCOPY  2008    stomach ulcer  . UVULOPALATOPHARYNGOPLASTY (UPPP)/TONSILLECTOMY/SEPTOPLASTY  2000    Home Medications:       Allergies as of 05/19/2019      Reactions   Nsaids Other (See Comments)   Hx of ulcer         Medication List       Accurate as of May 19, 2019 11:59 PM. If you have any questions, ask your nurse or doctor.        STOP taking these medications   finasteride 5 MG tablet Commonly known as: PROSCAR Stopped by: [name], MD     TAKE these medications   amLODipine 5 MG tablet Commonly known as: NORVASC Take 5 mg by mouth at bedtime.   aspirin 81 MG EC tablet Take 1 tablet (81 mg total) by mouth daily. Swallow whole. What changed: when to take this   indapamide 2.5 MG tablet Commonly known as: LOZOL Take 1 tablet (2.5 mg total) by mouth daily.   metoprolol succinate 25 MG 24 hr tablet Commonly known as: TOPROL-XL Take 25 mg by mouth daily.   mirabegron ER 50 MG Tb24 tablet Commonly known as: Myrbetriq TAKE 1 TABLET(50 MG) BY MOUTH DAILY   mometasone 50 MCG/ACT nasal spray Commonly known as: NASONEX Place 2 sprays into the nose daily as needed (allergies/congestion.).   pravastatin 40 MG tablet Commonly known as: PRAVACHOL Take 40 mg by mouth at bedtime.   sildenafil 20 MG tablet Commonly known as: Revatio Take 1 tablet (20 mg total) by mouth as needed. Take 1-5 tabs as needed prior to intercourse       Allergies:       Allergies  Allergen Reactions  . Nsaids Other (See Comments)    Hx of ulcer    Family History:      Family History  Problem Relation Age of Onset  . Heart attack Father   . Prostatitis Neg Hx   . Prostate cancer Neg Hx   . Kidney cancer Neg Hx   . Bladder Cancer Neg Hx     Social History:  reports that he has never smoked. He has never used smokeless tobacco. He reports current alcohol use. He reports that he does not use drugs.   Physical Exam: BP (!) 147/75   Pulse  79   Ht 5\' 10"  (1.778 m)   Wt 267 lb (121.1 kg)   BMI 38.31 kg/m   Constitutional:  Alert and oriented, No acute distress. HEENT: [place] AT, moist mucus membranes.  Trachea midline, no masses. Cardiovascular: No clubbing, cyanosis, or edema. Respiratory: Normal respiratory effort, no increased work of breathing. Skin: No rashes, bruises or suspicious lesions. Neurologic: Grossly intact, no focal deficits, moving all 4 extremities. Psychiatric: Normal mood and affect.  Laboratory Data: UDS reviewed. See epic.   Assessment & Plan:    1. BPH with urinary obstruction  UA/ urine culture sent today - pending   Evidene of outlet obstruction and would likely benefit  from surgical procedure given gland size and prostate anatomy. Good candiddate for Urolift, HoLEP or TURP.  We discussed alternatives including TURP vs. holmium laser enucleation of the prostate vs. Urolift. Differences between the surgical procedures were discussed as well as the risks and benefits of each.   He is most interested in UroLift due to his concern for ejaculatory issues.  Explained to pt he is a good candidate for Urolift.  Risks of the procedure reviewed extensively including discussed blood in urine, infection and possible ineffectiveness of procedure.  We also discussed the postoperative course, possibility of need for further procedures, stone formation, catheter etc.  All questions answered.  Is willing to proceed as planned.    2. OAB/detrusor instability Demonstrated on UDS  Pt discontinued Myrbetriq 50 mg since he needed refills  Continue Myrbetriq 50 mg, refills given  Continue finasteride   3. Stones Pt discontinued indapamide 2.5 mg since he needed refills  Encouraged to continue use of indapamide 2.5 mg for stone prevention, refills given    [facility] [address], [place], [place] [address] [phone]  I, Lucas Mallow, am acting as a  scribe for Dr. Hollice Espy,  I have reviewed the above documentation for accuracy and completeness, and I agree with the above.   Hollice Espy, MD  I spent 45 total minutes on the day of the encounter including pre-visit review of the medical record, face-to-face time with the patient, and post visit ordering of labs/imaging/tests.

## 2019-08-14 NOTE — Anesthesia Preprocedure Evaluation (Addendum)
Anesthesia Evaluation  Patient identified by MRN, date of birth, ID band Patient awake    Reviewed: Allergy & Precautions, H&P , NPO status , Patient's Chart, lab work & pertinent test results  Airway Mallampati: III  TM Distance: <3 FB     Dental   Pulmonary sleep apnea ,    Pulmonary exam normal        Cardiovascular hypertension, + CAD  Normal cardiovascular exam     Neuro/Psych  Headaches, negative psych ROS   GI/Hepatic Neg liver ROS, PUD, GERD  ,  Endo/Other  negative endocrine ROS  Renal/GU Renal stones  negative genitourinary   Musculoskeletal  (+) Arthritis , Osteoarthritis,    Abdominal Normal abdominal exam  (+)   Peds negative pediatric ROS (+)  Hematology negative hematology ROS (+)   Anesthesia Other Findings Past Medical History: No date: Coronary artery disease No date: DDD (degenerative disc disease), lumbar No date: GERD (gastroesophageal reflux disease) No date: Headache     Comment:  not intractable No date: History of kidney stones No date: Hyperlipemia No date: Hypertension No date: Impaired glucose tolerance 2018: Kidney stones No date: Labral tear of hip, degenerative No date: Lumbar radiculitis No date: Obesity (BMI 30-39.9) No date: Shingles     Comment:  left T6 No date: Sleep apnea     Comment:  USES CPAP No date: Stomach ulcer  Past Surgical History: 02/11/2018: COLONOSCOPY WITH PROPOFOL; N/A     Comment:  Procedure: COLONOSCOPY WITH PROPOFOL;  Surgeon:               Lollie Sails, MD;  Location: [facility] ENDOSCOPY;                Service: Endoscopy;  Laterality: N/A; 06/25/2016: EXTRACORPOREAL SHOCK WAVE LITHOTRIPSY; Right     Comment:  Procedure: EXTRACORPOREAL SHOCK WAVE LITHOTRIPSY (ESWL);              Surgeon: Hollice Espy, MD;  Location: [facility] ORS;                Service: Urology;  Laterality: Right; No date: GASTRIC ULCER REPAIR No date: KNEE SURGERY;  Left 05/11/2018: LEFT HEART CATH AND CORONARY ANGIOGRAPHY; Left     Comment:  Procedure: LEFT HEART CATH AND CORONARY ANGIOGRAPHY;                Surgeon: Isaias Cowman, MD;  Location: [facility] CV LAB;  Service: Cardiovascular;  Laterality:               Left; No date: TONSILLECTOMY 2008: UPPER GI ENDOSCOPY     Comment:  stomach ulcer 2000: UVULOPALATOPHARYNGOPLASTY (UPPP)/TONSILLECTOMY/SEPTOPLASTY     Reproductive/Obstetrics negative OB ROS                            Anesthesia Physical Anesthesia Plan  ASA: III  Anesthesia Plan: General   Post-op Pain Management:    Induction: Intravenous  PONV Risk Score and Plan:   Airway Management Planned: Oral ETT and LMA  Additional Equipment:   Intra-op Plan:   Post-operative Plan: Extubation in OR  Informed Consent: I have reviewed the patients History and Physical, chart, labs and discussed the procedure including the risks, benefits and alternatives for the proposed anesthesia with the patient or authorized representative who has indicated his/her understanding and acceptance.     Dental Advisory  Given  Plan Discussed with: Anesthesiologist, CRNA and Surgeon  Anesthesia Plan Comments:       Anesthesia Quick Evaluation

## 2019-08-14 NOTE — Anesthesia Procedure Notes (Signed)
Procedure Name: LMA Insertion Date/Time: 08/14/2019 2:45 PM Performed by: Junious Silk, CRNA Pre-anesthesia Checklist: Patient identified, Patient being monitored, Timeout performed, Emergency Drugs available and Suction available Patient Re-evaluated:Patient Re-evaluated prior to induction Oxygen Delivery Method: Circle system utilized Preoxygenation: Pre-oxygenation with 100% oxygen Induction Type: IV induction Ventilation: Mask ventilation without difficulty LMA: LMA inserted LMA Size: 4.0 Tube type: Oral Number of attempts: 1 Placement Confirmation: positive ETCO2 and breath sounds checked- equal and bilateral Tube secured with: Tape Dental Injury: Teeth and Oropharynx as per pre-operative assessment

## 2019-08-14 NOTE — Op Note (Signed)
Preoperative diagnosis: BPH with obstructive symptomatology   Postoperative diagnosis: BPH with obstructive symptomatology   Principal procedure: Urolift procedure, with the placement of 5 implants.   Surgeon: [name]   Anesthesia: LMA   Complications: None   Drains: None   Estimated blood loss: < 5 mL   Indications: 59 year-old male with obstructive symptomatology secondary to BPH.  The patient's symptoms have progressed, and he has requested further management.  Management options including TURP with resection/ablation of the prostate as well as Urolift were discussed.  The patient has chosen to have a Urolift procedure.  He has been instructed to the procedure as well as risks and complications which include but are not limited to infection, bleeding, and inadequate treatment with the Urolift procedure alone, anesthetic complications, among others.  He understands these and desires to proceed.   Findings: Using the 17 French cystoscope, urethra and bladder were inspected.  There were no urethral lesions.  Prostatic urethra was obstructed secondary to bilobar hypertrophy.  The bladder was inspected circumferentially.  This revealed normal findings other than mild to moderate trabeculation.  He did have somewhat of elevated bladder neck but without significant intravesical protrusion.   Description of procedure: The patient was properly identified in the holding area.  He received preoperative IV antibiotics.  He was taken to the operating room where general anesthetic was administered with the LMA.  He is placed in the dorsolithotomy position.  Genitalia and perineum were prepped and draped.  Proper timeout was performed.   A 6F cystoscope was inserted into the bladder with findings as described above.  The 1st pair of implants were placed at the bladder neck ~1.5 cm from the bladder Neck. The 2nd pair of implants were placed at the level of the verumontanum.   A repeat cysto was  performed and an additional single implant was delivered in a stacked fashion on the right side near the level of the verumontanum to create the desired visual open anterior channel.   A final cystoscopy was conducted first to inspect the location and state of each implant and second, to confirm the presence of a continuous anterior channel was present through the prostatic urethra with irrigation flow turned off.    5 implants were delivered in total.    Following this, the scope was removed.  After anesthetic reversal he was transported to the PACU in stable condition.  He tolerated the procedure well.   Plan: He will follow-up with me in 4 to 6 weeks for IPSS/PVR.

## 2019-08-15 ENCOUNTER — Encounter: Payer: Self-pay | Admitting: Urology

## 2019-08-15 NOTE — Anesthesia Postprocedure Evaluation (Signed)
Anesthesia Post Note  Patient: Joe Dunlap  Procedure(s) Performed: CYSTOSCOPY WITH INSERTION OF UROLIFT (N/A Prostate)  Patient location during evaluation: PACU Anesthesia Type: General Level of consciousness: awake and alert and oriented Pain management: pain level controlled Vital Signs Assessment: post-procedure vital signs reviewed and stable Respiratory status: spontaneous breathing Cardiovascular status: blood pressure returned to baseline Anesthetic complications: no   No complications documented.   Last Vitals:  Vitals:   08/14/19 1600 08/14/19 1613  BP:  139/72  Pulse: 76 78  Resp:  16  Temp:  36.5 C  SpO2: 99% 99%    Last Pain:  Vitals:   08/14/19 1613  TempSrc: Temporal  PainSc: 4                  Barrie Wale

## 2019-09-26 ENCOUNTER — Other Ambulatory Visit: Payer: Self-pay

## 2019-09-26 ENCOUNTER — Ambulatory Visit
Admission: RE | Admit: 2019-09-26 | Discharge: 2019-09-26 | Disposition: A | Discharge status: Home / Self Care | Payer: BC Managed Care – PPO | Source: Ambulatory Visit | Attending: Family Medicine | Admitting: Family Medicine

## 2019-09-26 ENCOUNTER — Ambulatory Visit (INDEPENDENT_AMBULATORY_CARE_PROVIDER_SITE_OTHER): Payer: BC Managed Care – PPO

## 2019-09-26 VITALS — BP 139/82 | HR 94 | Temp 99.0°F | Resp 18 | Ht 70.0 in | Wt 262.0 lb

## 2019-09-26 DIAGNOSIS — M545 Low back pain, unspecified: Secondary | ICD-10-CM

## 2019-09-26 MED ORDER — TRAMADOL HCL 50 MG PO TABS: 50.0000 mg | ORAL_TABLET | Freq: Three times a day (TID) | ORAL | 0 refills | Status: DC | PRN

## 2019-09-26 NOTE — ED Provider Notes (Signed)
[facility]    CSN: [id number] Arrival date & time: 09/26/19  1349      History   Chief Complaint Chief Complaint  Patient presents with  . Work Related Injury  . Back Pain   HPI  59 year old male presents with a Workmen's Comp. Injury.  Patient reports that he was injured at work yesterday afternoon around 430.  He was hit in the back by a forklift and was thrown forward.  Patient reports that he injured his left hip, left low back, and right wrist.  Left hip pain and wrist pain have resolved following ice.  He continues to have low back pain.  He was encouraged to come in for evaluation.  Pain is 5/10 in severity.  No radicular symptoms.  No relieving factors.  No other associated symptoms.  No other complaints.  Past Medical History:  Diagnosis Date  . Coronary artery disease   . DDD (degenerative disc disease), lumbar   . GERD (gastroesophageal reflux disease)   . Headache    not intractable  . History of kidney stones   . Hyperlipemia   . Hypertension   . Impaired glucose tolerance   . Kidney stones 2018  . Labral tear of hip, degenerative   . Lumbar radiculitis   . Obesity (BMI 30-39.9)   . Shingles    left T6  . Sleep apnea    USES CPAP  . Stomach ulcer     Patient Active Problem List   Diagnosis Date Noted  . Chronic tension-type headache, not intractable 03/04/2017  . Impaired glucose tolerance 01/09/2014  . Shingles 10/19/2013  . Right hip pain 09/20/2013  . DDD (degenerative disc disease), lumbar 09/07/2013  . Labral tear of hip, degenerative 09/07/2013  . Lumbar radiculitis 09/07/2013  . Elevated fasting blood sugar 08/03/2013  . GERD (gastroesophageal reflux disease) 08/03/2013  . Hyperlipidemia, unspecified 08/03/2013  . Hypertension, essential, benign 08/03/2013  . Low back pain 08/03/2013  . Abdominal pain 07/31/2013    Past Surgical History:  Procedure Laterality Date  . COLONOSCOPY WITH PROPOFOL N/A 02/11/2018   Procedure:  COLONOSCOPY WITH PROPOFOL;  Surgeon: Christena Deem, MD;  Location: [facility] ENDOSCOPY;  Service: Endoscopy;  Laterality: N/A;  . CYSTOSCOPY WITH INSERTION OF UROLIFT N/A 08/14/2019   Procedure: CYSTOSCOPY WITH INSERTION OF UROLIFT;  Surgeon: [name], MD;  Location: [facility] ORS;  Service: Urology;  Laterality: N/A;  . EXTRACORPOREAL SHOCK WAVE LITHOTRIPSY Right 06/25/2016   Procedure: EXTRACORPOREAL SHOCK WAVE LITHOTRIPSY (ESWL);  Surgeon: [name], MD;  Location: [facility] ORS;  Service: Urology;  Laterality: Right;  . GASTRIC ULCER REPAIR    . KNEE SURGERY Left   . LEFT HEART CATH AND CORONARY ANGIOGRAPHY Left 05/11/2018   Procedure: LEFT HEART CATH AND CORONARY ANGIOGRAPHY;  Surgeon: Marcina Millard, MD;  Location: [facility] INVASIVE CV LAB;  Service: Cardiovascular;  Laterality: Left;  . TONSILLECTOMY    . UPPER GI ENDOSCOPY  2008   stomach ulcer  . UVULOPALATOPHARYNGOPLASTY (UPPP)/TONSILLECTOMY/SEPTOPLASTY  2000       Home Medications    Prior to Admission medications   Medication Sig Start Date End Date Taking? Authorizing Provider  amLODipine (NORVASC) 5 MG tablet Take 5 mg by mouth at bedtime.    Yes [provider]  aspirin 81 MG EC tablet Take 1 tablet (81 mg total) by mouth daily. Swallow whole. Patient taking differently: Take 81 mg by mouth at bedtime. Swallow whole. 10/03/17  Yes Emily Filbert, MD  indapamide (LOZOL)  2.5 MG tablet Take 1 tablet (2.5 mg total) by mouth daily. Patient taking differently: Take 2.5 mg by mouth every evening.  05/19/19  Yes [name], MD  mometasone (NASONEX) 50 MCG/ACT nasal spray Place 2 sprays into the nose daily.    Yes [provider]  pravastatin (PRAVACHOL) 40 MG tablet Take 40 mg by mouth at bedtime.   Yes [provider]  sildenafil (REVATIO) 20 MG tablet Take 1 tablet (20 mg total) by mouth as needed. Take 1-5 tabs as needed prior to intercourse Patient taking differently: Take 20-60 mg by mouth  as needed (ED). Take 1-5 tabs as needed prior to intercourse 10/16/16  Yes [name], MD  traMADol (ULTRAM) 50 MG tablet Take 1 tablet (50 mg total) by mouth every 8 (eight) hours as needed. 09/26/19   Tommie Sams, DO  mirabegron ER (MYRBETRIQ) 50 MG TB24 tablet TAKE 1 TABLET(50 MG) BY MOUTH DAILY Patient not taking: Reported on 07/27/2019 05/19/19 09/26/19  [name], MD  omeprazole (PRILOSEC OTC) 20 MG tablet Take 20 mg by mouth every morning.  09/26/19  [provider]    Family History Family History  Problem Relation Age of Onset  . Heart attack Father   . Prostatitis Neg Hx   . Prostate cancer Neg Hx   . Kidney cancer Neg Hx   . Bladder Cancer Neg Hx     Social History Social History   Tobacco Use  . Smoking status: Never Smoker  . Smokeless tobacco: Never Used  Vaping Use  . Vaping Use: Never used  Substance Use Topics  . Alcohol use: Yes    Comment: OCC  . Drug use: No     Allergies   Nsaids   Review of Systems Review of Systems  Constitutional: Negative.   Musculoskeletal: Positive for back pain.   Physical Exam Triage Vital Signs ED Triage Vitals  Enc Vitals Group     BP 09/26/19 1423 (!) 139/82     Pulse Rate 09/26/19 1423 94     Resp 09/26/19 1423 18     Temp 09/26/19 1423 99 F (37.2 C)     Temp Source 09/26/19 1423 Oral     SpO2 09/26/19 1423 98 %     Weight 09/26/19 1421 (!) 262 lb (118.8 kg)     Height 09/26/19 1421 5\' 10"  (1.778 m)     Head Circumference --      Peak Flow --      Pain Score 09/26/19 1421 5     Pain Loc --      Pain Edu? --      Excl. in GC? --    Updated Vital Signs BP (!) 139/82 (BP Location: Right Arm)   Pulse 94   Temp 99 F (37.2 C) (Oral)   Resp 18   Ht 5\' 10"  (1.778 m)   Wt (!) 118.8 kg   SpO2 98%   BMI 37.59 kg/m   Visual Acuity Right Eye Distance:   Left Eye Distance:   Bilateral Distance:    Right Eye Near:   Left Eye Near:    Bilateral Near:     Physical Exam Vitals and  nursing note reviewed.  Constitutional:      General: He is not in acute distress.    Appearance: Normal appearance. He is obese. He is not ill-appearing.  HENT:     Head: Normocephalic and atraumatic.  Eyes:     General:  Right eye: No discharge.        Left eye: No discharge.     Conjunctiva/sclera: Conjunctivae normal.  Cardiovascular:     Rate and Rhythm: Normal rate and regular rhythm.  Pulmonary:     Effort: Pulmonary effort is normal.     Breath sounds: Normal breath sounds. No wheezing, rhonchi or rales.  Musculoskeletal:     Comments: No discrete tenderness of the lumbar spine.  Neurological:     Mental Status: He is alert.  Psychiatric:        Mood and Affect: Mood normal.        Behavior: Behavior normal.    UC Treatments / Results  Labs (all labs ordered are listed, but only abnormal results are displayed) Labs Reviewed - No data to display  EKG   Radiology DG Lumbar Spine Complete  Result Date: 09/26/2019 CLINICAL DATA:  Hit with forklift EXAM: LUMBAR SPINE - COMPLETE 4+ VIEW COMPARISON:  CT 05/19/2016 FINDINGS: Mild dextroscoliosis of the lumbar spine. Bilateral kidney stones measuring up to 12 mm on the left and 5 mm on the right. Vertebral body heights are maintained. Multiple level degenerative changes throughout the lumbar spine, with moderate disc space narrowing and osteophyte at L1-L2, L2-L3 and L5-S1. Prominent facet degenerative changes of the lower lumbar spine. IMPRESSION: 1. Mild scoliosis with multiple level degenerative change. No acute osseous abnormality. 2. Bilateral nephrolithiasis. Electronically Signed   By: Jasmine Pang M.D.   On: 09/26/2019 15:23    Procedures Procedures (including critical care time)  Medications Ordered in UC Medications - No data to display  Initial Impression / Assessment and Plan / UC Course  I have reviewed the triage vital signs and the nursing notes.  Pertinent labs & imaging results that were  available during my care of the patient were reviewed by me and considered in my medical decision making (see chart for details).    59 year old male presents with low back pain.  This is a [employer]. injury.  X-ray obtained and was independently reviewed by me.  Interpretation: Degenerative changes, scoliosis, and bilateral nephrolithiasis.  No acute fracture.  Tramadol as needed for pain.  Patient cannot take NSAIDs.  Workmen's Comp. form filled out.  May return to work later in the week.  Advised rest, ice, and heat as well.  Final Clinical Impressions(s) / UC Diagnoses   Final diagnoses:  Acute low back pain without sciatica, unspecified back pain laterality   Discharge Instructions   None    ED Prescriptions    Medication Sig Dispense Auth. Provider   traMADol (ULTRAM) 50 MG tablet Take 1 tablet (50 mg total) by mouth every 8 (eight) hours as needed. 15 tablet Everlene Other G, DO     I have reviewed the PDMP during this encounter.   Tommie Sams, [place] 09/26/19 1601

## 2019-09-26 NOTE — ED Triage Notes (Signed)
Patient states that he is here for a Work Related injury that occurred yesterday around 4:25pm. States that he was hit with a forklift in the back while working and sent him "flying" across the room and states that he landed on the ground. States that he has continued to have left sided back pain.

## 2019-10-05 ENCOUNTER — Other Ambulatory Visit: Payer: Self-pay | Admitting: *Deleted

## 2019-10-05 DIAGNOSIS — N138 Other obstructive and reflux uropathy: Secondary | ICD-10-CM

## 2019-10-06 ENCOUNTER — Ambulatory Visit: Payer: BC Managed Care – PPO | Admitting: Urology

## 2019-10-13 ENCOUNTER — Other Ambulatory Visit: Payer: Self-pay | Admitting: Family Medicine

## 2019-10-13 DIAGNOSIS — G8929 Other chronic pain: Secondary | ICD-10-CM

## 2019-11-02 ENCOUNTER — Other Ambulatory Visit: Payer: Self-pay

## 2019-11-02 ENCOUNTER — Ambulatory Visit
Admission: RE | Admit: 2019-11-02 | Discharge: 2019-11-02 | Disposition: A | Discharge status: Home / Self Care | Payer: BC Managed Care – PPO | Source: Ambulatory Visit | Attending: Family Medicine | Admitting: Family Medicine

## 2019-11-02 DIAGNOSIS — G8929 Other chronic pain: Secondary | ICD-10-CM | POA: Insufficient documentation

## 2019-11-02 DIAGNOSIS — R519 Headache, unspecified: Secondary | ICD-10-CM | POA: Diagnosis not present

## 2020-04-25 ENCOUNTER — Other Ambulatory Visit: Payer: Self-pay | Admitting: Physical Medicine and Rehabilitation

## 2020-04-25 DIAGNOSIS — M5442 Lumbago with sciatica, left side: Secondary | ICD-10-CM

## 2020-04-30 ENCOUNTER — Other Ambulatory Visit: Payer: BC Managed Care – PPO

## 2020-05-23 ENCOUNTER — Other Ambulatory Visit: Payer: Self-pay | Admitting: Urology

## 2020-05-28 ENCOUNTER — Other Ambulatory Visit: Payer: Self-pay

## 2020-05-28 ENCOUNTER — Ambulatory Visit
Admission: RE | Admit: 2020-05-28 | Discharge: 2020-05-28 | Disposition: A | Discharge status: Home / Self Care | Payer: BC Managed Care – PPO | Source: Ambulatory Visit | Attending: Physical Medicine and Rehabilitation | Admitting: Physical Medicine and Rehabilitation

## 2020-05-28 DIAGNOSIS — M5442 Lumbago with sciatica, left side: Secondary | ICD-10-CM

## 2020-08-02 ENCOUNTER — Other Ambulatory Visit: Payer: Self-pay

## 2020-08-02 ENCOUNTER — Emergency Department
Admission: EM | Admit: 2020-08-02 | Discharge: 2020-08-02 | Disposition: A | Discharge status: Home / Self Care | Payer: BC Managed Care – PPO | Attending: Emergency Medicine | Admitting: Emergency Medicine

## 2020-08-02 ENCOUNTER — Emergency Department: Payer: BC Managed Care – PPO

## 2020-08-02 DIAGNOSIS — Z79899 Other long term (current) drug therapy: Secondary | ICD-10-CM | POA: Diagnosis not present

## 2020-08-02 DIAGNOSIS — R0789 Other chest pain: Secondary | ICD-10-CM | POA: Insufficient documentation

## 2020-08-02 DIAGNOSIS — I2510 Atherosclerotic heart disease of native coronary artery without angina pectoris: Secondary | ICD-10-CM | POA: Insufficient documentation

## 2020-08-02 DIAGNOSIS — I10 Essential (primary) hypertension: Secondary | ICD-10-CM | POA: Insufficient documentation

## 2020-08-02 LAB — CBC
HCT: 44.2 % (ref 39.0–52.0)
Hemoglobin: 15 g/dL (ref 13.0–17.0)
MCH: 31.1 pg (ref 26.0–34.0)
MCHC: 33.9 g/dL (ref 30.0–36.0)
MCV: 91.7 fL (ref 80.0–100.0)
Platelets: 231 10*3/uL (ref 150–400)
RBC: 4.82 MIL/uL (ref 4.22–5.81)
RDW: 12.6 % (ref 11.5–15.5)
WBC: 8 10*3/uL (ref 4.0–10.5)
nRBC: 0 % (ref 0.0–0.2)

## 2020-08-02 LAB — TROPONIN I (HIGH SENSITIVITY)
Troponin I (High Sensitivity): 4 ng/L (ref <18)
Troponin I (High Sensitivity): 4 ng/L (ref <18)

## 2020-08-02 LAB — BASIC METABOLIC PANEL
Anion gap: 9 (ref 5–15)
BUN: 11 mg/dL (ref 6–20)
CO2: 22 mmol/L (ref 22–32)
Calcium: 8.9 mg/dL (ref 8.9–10.3)
Chloride: 105 mmol/L (ref 98–111)
Creatinine, Ser: 0.83 mg/dL (ref 0.61–1.24)
GFR, Estimated: >60 mL/min (ref >60)
Glucose, Bld: 90 mg/dL (ref 70–99)
Potassium: 3.6 mmol/L (ref 3.5–5.1)
Sodium: 136 mmol/L (ref 135–145)

## 2020-08-02 MED ORDER — ACETAMINOPHEN 500 MG PO TABS
1000.0000 mg | ORAL_TABLET | Freq: Once | ORAL | Status: AC
  Administered 2020-08-02: 1000 mg via ORAL
  Filled 2020-08-02: qty 2

## 2020-08-02 MED ORDER — ASPIRIN 81 MG PO CHEW
324.0000 mg | CHEWABLE_TABLET | Freq: Once | ORAL | Status: AC
  Administered 2020-08-02: 324 mg via ORAL
  Filled 2020-08-02: qty 4

## 2020-08-02 MED ORDER — NITROGLYCERIN 0.4 MG SL SUBL
0.4000 mg | SUBLINGUAL_TABLET | SUBLINGUAL | Status: DC | PRN
  Administered 2020-08-02: 0.4 mg via SUBLINGUAL
  Filled 2020-08-02: qty 1

## 2020-08-02 NOTE — ED Notes (Signed)
Lavender and light green top sent to lab at this time. 

## 2020-08-02 NOTE — ED Triage Notes (Signed)
First Nurse Note:  C/O CP today.  AAOx3.  Skin warm and dry. NAD.  No SOB/ DOE

## 2020-08-02 NOTE — ED Triage Notes (Signed)
Pt to ER via POV with complaints of centralized chest pain that radiates into left side of neck and jaw that started at approx 10am this morning. Hx of similar event that was inconclusive. Pt denies shortness of breath or dizziness. Reports pressure behind eyes. Hx of HTN.  Reports scheduled for stress test next month.

## 2020-08-02 NOTE — ED Notes (Signed)
Spoke with lab regarding first trop draw never resulted

## 2020-08-02 NOTE — ED Provider Notes (Signed)
[facility] Emergency Department Provider Note ____________________________________________   Event Date/Time   First MD Initiated Contact with Patient 08/02/20 1601     (approximate)  I have reviewed the triage vital signs and the nursing notes.  HISTORY  Chief Complaint Chest Pain   HPI Joe Dunlap is a 60 y.o. malewho presents to the ED for evaluation of chest pain.   Chart review indicates history of obesity, HTN, HLD. Follows with [facility] cardiology, Dr. Lady Gary, due to CAD.  Had a left heart cath 2 years ago with multivessel disease, acute marginal branch up to 75% stenosis.  No intervention.  Managed medically.  He has an upcoming stress test next month.  Patient presents to the ED for evaluation of chest discomfort throughout the day today.  He reports feeling minor chest pressure, 2/10 intensity, throughout the day today that has been constant without waxing or waning.  Reports the pain radiating up to his left neck and jaw earlier today while at work, and this triggered him to come to the ED for evaluation.  Denies worsening pain with exertion.  Denies syncopal episodes, shortness of breath, emesis, nausea or diaphoresis.  Reports continued 1 or 2/10 intensity now.  Past Medical History:  Diagnosis Date  . Coronary artery disease   . DDD (degenerative disc disease), lumbar   . GERD (gastroesophageal reflux disease)   . Headache    not intractable  . History of kidney stones   . Hyperlipemia   . Hypertension   . Impaired glucose tolerance   . Kidney stones 2018  . Labral tear of hip, degenerative   . Lumbar radiculitis   . Obesity (BMI 30-39.9)   . Shingles    left T6  . Sleep apnea    USES CPAP  . Stomach ulcer     Patient Active Problem List   Diagnosis Date Noted  . Chronic tension-type headache, not intractable 03/04/2017  . Impaired glucose tolerance 01/09/2014  . Shingles 10/19/2013  . Right hip pain 09/20/2013  . DDD  (degenerative disc disease), lumbar 09/07/2013  . Labral tear of hip, degenerative 09/07/2013  . Lumbar radiculitis 09/07/2013  . Elevated fasting blood sugar 08/03/2013  . GERD (gastroesophageal reflux disease) 08/03/2013  . Hyperlipidemia, unspecified 08/03/2013  . Hypertension, essential, benign 08/03/2013  . Low back pain 08/03/2013  . Abdominal pain 07/31/2013    Past Surgical History:  Procedure Laterality Date  . COLONOSCOPY WITH PROPOFOL N/A 02/11/2018   Procedure: COLONOSCOPY WITH PROPOFOL;  Surgeon: Christena Deem, MD;  Location: [facility] ENDOSCOPY;  Service: Endoscopy;  Laterality: N/A;  . CYSTOSCOPY WITH INSERTION OF UROLIFT N/A 08/14/2019   Procedure: CYSTOSCOPY WITH INSERTION OF UROLIFT;  Surgeon: [name], MD;  Location: [facility] ORS;  Service: Urology;  Laterality: N/A;  . EXTRACORPOREAL SHOCK WAVE LITHOTRIPSY Right 06/25/2016   Procedure: EXTRACORPOREAL SHOCK WAVE LITHOTRIPSY (ESWL);  Surgeon: [name], MD;  Location: [facility] ORS;  Service: Urology;  Laterality: Right;  . GASTRIC ULCER REPAIR    . KNEE SURGERY Left   . LEFT HEART CATH AND CORONARY ANGIOGRAPHY Left 05/11/2018   Procedure: LEFT HEART CATH AND CORONARY ANGIOGRAPHY;  Surgeon: Marcina Millard, MD;  Location: [facility] INVASIVE CV LAB;  Service: Cardiovascular;  Laterality: Left;  . TONSILLECTOMY    . UPPER GI ENDOSCOPY  2008   stomach ulcer  . UVULOPALATOPHARYNGOPLASTY (UPPP)/TONSILLECTOMY/SEPTOPLASTY  2000    Prior to Admission medications   Medication Sig Start Date End Date Taking? Authorizing Provider  amLODipine (  NORVASC) 5 MG tablet Take 5 mg by mouth at bedtime.    Yes [provider]  mometasone (NASONEX) 50 MCG/ACT nasal spray Place 2 sprays into the nose daily.   Yes [provider]  pravastatin (PRAVACHOL) 40 MG tablet Take 40 mg by mouth at bedtime.   Yes [provider]  mirabegron ER (MYRBETRIQ) 50 MG TB24 tablet TAKE 1 TABLET(50 MG) BY MOUTH DAILY Patient not  taking: Reported on 07/27/2019 05/19/19 09/26/19  [name], MD  omeprazole (PRILOSEC OTC) 20 MG tablet Take 20 mg by mouth every morning.  09/26/19  [provider]    Allergies Nsaids  Family History  Problem Relation Age of Onset  . Heart attack Father   . Prostatitis Neg Hx   . Prostate cancer Neg Hx   . Kidney cancer Neg Hx   . Bladder Cancer Neg Hx     Social History Social History   Tobacco Use  . Smoking status: Never Smoker  . Smokeless tobacco: Never Used  Vaping Use  . Vaping Use: Never used  Substance Use Topics  . Alcohol use: Yes    Comment: OCC  . Drug use: No    Review of Systems  Constitutional: No fever/chills Eyes: No visual changes. ENT: No sore throat. Cardiovascular: Positive for chest pain. Respiratory: Denies shortness of breath. Gastrointestinal: No abdominal pain.  No nausea, no vomiting.  No diarrhea.  No constipation. Genitourinary: Negative for dysuria. Musculoskeletal: Negative for back pain. Skin: Negative for rash. Neurological: Negative for headaches, focal weakness or numbness.  ____________________________________________   PHYSICAL EXAM:  VITAL SIGNS: Vitals:   08/02/20 1900 08/02/20 1920  BP: 132/68 133/68  Pulse:    Resp: 14 18  Temp:    SpO2: 99% 98%     Constitutional: Alert and oriented. Well appearing and in no acute distress.  Obese, pleasant and conversational. Eyes: Conjunctivae are normal. PERRL. EOMI. Head: Atraumatic. Nose: No congestion/rhinnorhea. Mouth/Throat: Mucous membranes are moist.  Oropharynx non-erythematous. Neck: No stridor. No cervical spine tenderness to palpation. Cardiovascular: Normal rate, regular rhythm. Grossly normal heart sounds.  Good peripheral circulation. Respiratory: Normal respiratory effort.  No retractions. Lungs CTAB. Gastrointestinal: Soft , nondistended, nontender to palpation. No CVA tenderness. Musculoskeletal: No lower extremity tenderness nor edema.  No  joint effusions. No signs of acute trauma. Neurologic:  Normal speech and language. No gross focal neurologic deficits are appreciated. No gait instability noted. Skin:  Skin is warm, dry and intact. No rash noted. Psychiatric: Mood and affect are normal. Speech and behavior are normal.  ____________________________________________   LABS (all labs ordered are listed, but only abnormal results are displayed)  Labs Reviewed  CBC  BASIC METABOLIC PANEL  TROPONIN I (HIGH SENSITIVITY)  TROPONIN I (HIGH SENSITIVITY)   ____________________________________________  12 Lead EKG  Sinus tachycardia, rate of 101 bpm.  Normal axis and intervals.  No evidence of acute ischemia. ____________________________________________  RADIOLOGY  ED MD interpretation: 2 view CXR reviewed by me without evidence of acute cardiopulmonary pathology.  Official radiology report(s): DG Chest 2 View  Result Date: 08/02/2020 CLINICAL DATA:  Chest pain EXAM: CHEST - 2 VIEW COMPARISON:  October 03, 2017 FINDINGS: Lungs are clear. Heart size and pulmonary vascularity are normal. No adenopathy. There is aortic atherosclerosis. No pneumothorax. No bone lesions. There is mild degenerative change in the of the thoracic spine. IMPRESSION: Lungs clear. Heart size normal. Aortic Atherosclerosis (ICD10-I70.0). Electronically Signed   By: Bretta Bang III M.D.   On:  08/02/2020 14:45    ____________________________________________   PROCEDURES and INTERVENTIONS  Procedure(s) performed (including Critical Care):  .1-3 Lead EKG Interpretation Performed by: Delton Prairie, MD Authorized by: Delton Prairie, MD     Interpretation: normal     ECG rate:  92   ECG rate assessment: normal     Rhythm: sinus rhythm     Ectopy: none     Conduction: normal      Medications  nitroGLYCERIN (NITROSTAT) SL tablet 0.4 mg (0.4 mg Sublingual Given 08/02/20 1732)  aspirin chewable tablet 324 mg (324 mg Oral Given 08/02/20 1704)   acetaminophen (TYLENOL) tablet 1,000 mg (1,000 mg Oral Given 08/02/20 1704)    ____________________________________________   MDM / ED COURSE   60 year old male presents to the ED with atypical chest pains without evidence of ACS or significant acute pathology, amenable to outpatient management.  Normal vitals.  Exam reassuring without evidence of acute derangements.  No evidence of neurologic or vascular deficits.  Work-up is benign and EKG without ischemic features and 2 high-sensitivity troponins are negative.  CXR without infiltrates and his chest pain resolves while in the ED.  He has follow-up next month for repeat stress test and we discussed return precautions for the ED.  Patient stable for outpatient management.   Clinical Course as of 08/02/20 2333  Caleen Essex Aug 02, 2020  1900 Reassessed.  Patient reports resolution of chest pain and feeling well.  He is requesting to go home.  We discussed difficulty with his lab values and repeat draws without a second troponin or metabolic panel yet.  He reports that he is willing to stay another half hour or so to try to get these numbers, but is eager to go home.  We discussed risks of going home with incomplete work-up.  He expressed understanding and agreement. [DS]    Clinical Course User Index [DS] Delton Prairie, MD    ____________________________________________   FINAL CLINICAL IMPRESSION(S) / ED DIAGNOSES  Final diagnoses:  Other chest pain     ED Discharge Orders    None       Joe Dunlap Katrinka Blazing   Note:  This document was prepared using Dragon voice recognition software and may include unintentional dictation errors.   Delton Prairie, MD 08/02/20 [id number]

## 2020-08-02 NOTE — ED Notes (Signed)
Called lab regarding missing 1st troponin reading advised we haven't got a result, MD advised

## 2021-07-10 ENCOUNTER — Other Ambulatory Visit: Payer: Self-pay | Admitting: *Deleted

## 2021-07-10 DIAGNOSIS — R350 Frequency of micturition: Secondary | ICD-10-CM

## 2021-07-11 ENCOUNTER — Other Ambulatory Visit
Admission: RE | Admit: 2021-07-11 | Discharge: 2021-07-11 | Disposition: A | Discharge status: Home / Self Care | Payer: BC Managed Care – PPO | Attending: Urology | Admitting: Urology

## 2021-07-11 ENCOUNTER — Encounter: Payer: Self-pay | Admitting: Urology

## 2021-07-11 ENCOUNTER — Ambulatory Visit: Payer: BC Managed Care – PPO | Admitting: Urology

## 2021-07-11 VITALS — BP 143/85 | HR 87 | Ht 70.0 in | Wt 262.0 lb

## 2021-07-11 DIAGNOSIS — R350 Frequency of micturition: Secondary | ICD-10-CM

## 2021-07-11 DIAGNOSIS — R109 Unspecified abdominal pain: Secondary | ICD-10-CM

## 2021-07-11 DIAGNOSIS — R3129 Other microscopic hematuria: Secondary | ICD-10-CM | POA: Diagnosis not present

## 2021-07-11 DIAGNOSIS — Z87442 Personal history of urinary calculi: Secondary | ICD-10-CM

## 2021-07-11 DIAGNOSIS — N138 Other obstructive and reflux uropathy: Secondary | ICD-10-CM

## 2021-07-11 DIAGNOSIS — N401 Enlarged prostate with lower urinary tract symptoms: Secondary | ICD-10-CM

## 2021-07-11 DIAGNOSIS — N3281 Overactive bladder: Secondary | ICD-10-CM

## 2021-07-11 LAB — URINALYSIS, COMPLETE (UACMP) WITH MICROSCOPIC
Bilirubin Urine: NEGATIVE
Glucose, UA: NEGATIVE mg/dL
Hgb urine dipstick: NEGATIVE
Ketones, ur: NEGATIVE mg/dL
Leukocytes,Ua: NEGATIVE
Nitrite: NEGATIVE
Protein, ur: NEGATIVE mg/dL
Specific Gravity, Urine: 1.02 (ref 1.005–1.030)
pH: 7 (ref 5.0–8.0)

## 2021-07-11 LAB — BLADDER SCAN AMB NON-IMAGING: Scan Result: 56

## 2021-07-11 MED ORDER — MIRABEGRON ER 50 MG PO TB24: 50.0000 mg | ORAL_TABLET | Freq: Every day | ORAL | 11 refills | Status: DC

## 2021-07-11 NOTE — Progress Notes (Signed)
? ?07/11/21 ?8:25 AM  ? ?Joe Dunlap [place] ?Apr 12, 1960 ?[id number] ? ?Referring provider:  ?Marina Goodell, MD ?[address] ?[place],  [place] [address] ?Chief Complaint  ?Patient presents with  ? Urinary Frequency  ? ? ?HPI: ?Joe Dunlap is a 61 y.o.male with a personal history of BPH with LUTS, recurrent nephrolithiasis, EDand diabetes who presents today for further evaluation of urinary frequency.  He underwent UroLift in 08/2019 and had been doing extremely well up until a few months ago, returned today because his symptoms have recurred.  He returns today to essentially reestablish care. ? ?Pre-2018 s/p ESWL x 1 in remote past.  S/p sucessful right ESWL for a 6 mm proximal ureteral calculus on 2/18 ?His stone analysis from 07/10/16 indicated stone composition of calcium oxalate monohydrate 70%, calcium oxalate dihydrate 27%, calcium phosphate 3%.  Multiple 12-hour urines and was previously managed on indapamide.  He ran out of this prescription is no longer taking this medication.  He does not think that he has any new stones and has had no stone episodes of which she is aware. ? ?He also has severe urinary symptoms.  He is undergone extensive evaluation ultimately elected to undergo UroLift in 2021.  He been doing well until a few months ago when he started having worsening urinary urgency frequency type symptoms as he had been previously having.  These had essentially resolved after UroLift for period of time.  He denies any dysuria or gross hematuria. ? ?He reports that he stopped myrbetriq and flomax because he ran out of medication.  ? ? IPSS   ? ? Row Name 07/11/21 1100  ?  ?  ?  ? International Prostate Symptom Score  ? How often have you had the sensation of not emptying your bladder? Not at All    ? How often have you had to urinate less than every two hours? Almost always    ? How often have you found you stopped and started again several times when you urinated? Not at All    ? How often have you  found it difficult to postpone urination? About half the time    ? How often have you had a weak urinary stream? Less than half the time    ? How often have you had to strain to start urination? Not at All    ? How many times did you typically get up at night to urinate? 3 Times    ? Total IPSS Score 13    ?  ? Quality of Life due to urinary symptoms  ? If you were to spend the rest of your life with your urinary condition just the way it is now how would you feel about that? Unhappy    ? ?  ?  ? ?  ? ? ?Score:  ?1-7 Mild ?8-19 Moderate ?20-35 Severe ? ? ? ?PMH: ?Past Medical History:  ?Diagnosis Date  ? Coronary artery disease   ? DDD (degenerative disc disease), lumbar   ? GERD (gastroesophageal reflux disease)   ? Headache   ? not intractable  ? History of kidney stones   ? Hyperlipemia   ? Hypertension   ? Impaired glucose tolerance   ? Kidney stones 2018  ? Labral tear of hip, degenerative   ? Lumbar radiculitis   ? Obesity (BMI 30-39.9)   ? Shingles   ? left T6  ? Sleep apnea   ? USES CPAP  ? Stomach ulcer   ? ? ?  Surgical History: ?Past Surgical History:  ?Procedure Laterality Date  ? COLONOSCOPY WITH PROPOFOL N/A 02/11/2018  ? Procedure: COLONOSCOPY WITH PROPOFOL;  Surgeon: Christena DeemSkulskie, Martin U, MD;  Location: [facility] ENDOSCOPY;  Service: Endoscopy;  Laterality: N/A;  ? CYSTOSCOPY WITH INSERTION OF UROLIFT N/A 08/14/2019  ? Procedure: CYSTOSCOPY WITH INSERTION OF UROLIFT;  Surgeon: Vanna ScotlandBrandon, Jalina Blowers, MD;  Location: [facility] ORS;  Service: Urology;  Laterality: N/A;  ? EXTRACORPOREAL SHOCK WAVE LITHOTRIPSY Right 06/25/2016  ? Procedure: EXTRACORPOREAL SHOCK WAVE LITHOTRIPSY (ESWL);  Surgeon: Vanna ScotlandAshley Norton Bivins, MD;  Location: [facility] ORS;  Service: Urology;  Laterality: Right;  ? GASTRIC ULCER REPAIR    ? KNEE SURGERY Left   ? LEFT HEART CATH AND CORONARY ANGIOGRAPHY Left 05/11/2018  ? Procedure: LEFT HEART CATH AND CORONARY ANGIOGRAPHY;  Surgeon: Marcina MillardParaschos, Alexander, MD;  Location: [facility] INVASIVE CV LAB;  Service: Cardiovascular;   Laterality: Left;  ? TONSILLECTOMY    ? UPPER GI ENDOSCOPY  2008  ? stomach ulcer  ? UVULOPALATOPHARYNGOPLASTY (UPPP)/TONSILLECTOMY/SEPTOPLASTY  2000  ? ? ?Home Medications:  ?Allergies as of 07/11/2021   ? ?   Reactions  ? Nsaids Other (See Comments)  ? Hx of ulcer  ? ?  ? ?  ?Medication List  ?  ? ?  ? Accurate as of Jul 11, 2021 11:59 PM. If you have any questions, ask your nurse or doctor.  ?  ?  ? ?  ? ?amLODipine 5 MG tablet ?Commonly known as: NORVASC ?Take 5 mg by mouth at bedtime. ?  ?mirabegron ER 50 MG Tb24 tablet ?Commonly known as: MYRBETRIQ ?Take 1 tablet (50 mg total) by mouth daily. ?Started by: Vanna ScotlandAshley Devan Danzer, MD ?  ?mometasone 50 MCG/ACT nasal spray ?Commonly known as: NASONEX ?Place 2 sprays into the nose daily. ?  ?pravastatin 40 MG tablet ?Commonly known as: PRAVACHOL ?Take 40 mg by mouth at bedtime. ?  ? ?  ? ? ?Allergies:  ?Allergies  ?Allergen Reactions  ? Nsaids Other (See Comments)  ?  Hx of ulcer  ? ? ?Family History: ?Family History  ?Problem Relation Age of Onset  ? Heart attack Father   ? Prostatitis Neg Hx   ? Prostate cancer Neg Hx   ? Kidney cancer Neg Hx   ? Bladder Cancer Neg Hx   ? ? ?Social History:  reports that he has never smoked. He has never used smokeless tobacco. He reports current alcohol use. He reports that he does not use drugs. ? ? ?Physical Exam: ?BP (!) 143/85   Pulse 87   Ht 5\' 10"  (1.778 m)   Wt 262 lb (118.8 kg)   BMI 37.59 kg/m?   ?Constitutional:  Alert and oriented, No acute distress. ?HEENT: [place] AT, moist mucus membranes.  Trachea midline, no masses. ?Cardiovascular: No clubbing, cyanosis, or edema. ?Respiratory: Normal respiratory effort, no increased work of breathing. ?Rectal: Normal sphincter tone,  30  CC prostate, smooth no nodules ?Skin: No rashes, bruises or suspicious lesions. ?Neurologic: Grossly intact, no focal deficits, moving all 4 extremities. ?Psychiatric: Normal mood and affect. ? ?Laboratory Data: ? ?Lab Results  ?Component Value Date  ?  CREATININE 0.83 08/02/2020  ? ?Urinalysis ?Component ?    Latest Ref Rng 07/11/2021  ?Color, Urine ?    YELLOW  YELLOW   ?Appearance ?    CLEAR  CLEAR   ?Specific Gravity, Urine ?    1.005 - 1.030  1.020   ?pH ?    5.0 - 8.0  7.0   ?Glucose, UA ?  NEGATIVE mg/dL NEGATIVE   ?Hgb urine dipstick ?    NEGATIVE  NEGATIVE   ?Bilirubin Urine ?    NEGATIVE  NEGATIVE   ?Ketones, ur ?    NEGATIVE mg/dL NEGATIVE   ?Protein ?    NEGATIVE mg/dL NEGATIVE   ?Nitrite ?    NEGATIVE  NEGATIVE   ?Squamous Epithelial / LPF ?    0 - 5  0-5   ?WBC, UA ?    0 - 5 WBC/hpf 0-5   ?RBC / HPF ?    0 - 5 RBC/hpf 6-10   ?Bacteria, UA ?    NONE SEEN  RARE !   ?Leukocytes,Ua ?    NEGATIVE  NEGATIVE   ?  ?! Abnormal ? ?Pertinent Imaging: ? 07/11/2021  ?Scan Result 56   ? ? ?Assessment & Plan:   ? ?Microscopic hematuria  ?- We discussed the differential diagnosis for microscopic hematuria including nephrolithiasis, renal or upper tract tumors, bladder ?stones, UTIs, or bladder tumors as well as undetermined etiologies. ?- Per AUA guidelines, I did recommend complete microscopic hematuria evaluation including CT, possible urine cytology, and office cystoscopy. ?-Given that this most likely is secondary to stone disease, we will plan for just a CT stone protocol in lieu of CT urogram to which he is agreeable.  He understands that this may not be as comprehensive but also is less expensive and decreases the amount of radiation. ? ?2. History of stones  ?- Discontinued indapamide  ?- Will further evaluate with CT stone if he has increased stone burden will place him back on indapamide  ? ?3. OAB ?- Would like refill on myrbetriq, refill sent  ? ?4. BPH with urinary obstruction  ?- Emptying adequately today  ?- PSA; pending  ? ?F/u cysto/ CT scan ? ?Tawni Millers as a Neurosurgeon for [name], MD.,have documented all relevant documentation on the behalf of [name], MD,as directed by  [name], MD while in the presence of [name], MD. ? ?I have reviewed the above documentation for accuracy and completeness, and I agree with the above.  ? ?[name], MD ? ? ?[facility] ?[address], [place]

## 2021-07-11 NOTE — Patient Instructions (Signed)

## 2021-07-22 ENCOUNTER — Ambulatory Visit
Admission: RE | Admit: 2021-07-22 | Discharge: 2021-07-22 | Disposition: A | Discharge status: Home / Self Care | Payer: BC Managed Care – PPO | Source: Ambulatory Visit | Attending: Urology | Admitting: Urology

## 2021-07-22 DIAGNOSIS — N138 Other obstructive and reflux uropathy: Secondary | ICD-10-CM | POA: Diagnosis present

## 2021-07-22 DIAGNOSIS — R109 Unspecified abdominal pain: Secondary | ICD-10-CM | POA: Diagnosis present

## 2021-07-22 DIAGNOSIS — R350 Frequency of micturition: Secondary | ICD-10-CM | POA: Insufficient documentation

## 2021-07-22 DIAGNOSIS — N401 Enlarged prostate with lower urinary tract symptoms: Secondary | ICD-10-CM | POA: Insufficient documentation

## 2021-08-05 ENCOUNTER — Other Ambulatory Visit
Admission: RE | Admit: 2021-08-05 | Discharge: 2021-08-05 | Disposition: A | Discharge status: Home / Self Care | Payer: BC Managed Care – PPO | Attending: Urology | Admitting: Urology

## 2021-08-05 DIAGNOSIS — N138 Other obstructive and reflux uropathy: Secondary | ICD-10-CM | POA: Insufficient documentation

## 2021-08-05 DIAGNOSIS — N401 Enlarged prostate with lower urinary tract symptoms: Secondary | ICD-10-CM | POA: Diagnosis present

## 2021-08-05 LAB — PSA: Prostatic Specific Antigen: 0.58 ng/mL (ref 0.00–4.00)

## 2021-08-07 ENCOUNTER — Other Ambulatory Visit: Payer: Self-pay

## 2021-08-07 DIAGNOSIS — N401 Enlarged prostate with lower urinary tract symptoms: Secondary | ICD-10-CM

## 2021-08-08 ENCOUNTER — Other Ambulatory Visit
Admission: RE | Admit: 2021-08-08 | Discharge: 2021-08-08 | Disposition: A | Discharge status: Home / Self Care | Payer: BC Managed Care – PPO | Attending: Urology | Admitting: Urology

## 2021-08-08 ENCOUNTER — Ambulatory Visit: Payer: BC Managed Care – PPO | Admitting: Urology

## 2021-08-08 ENCOUNTER — Encounter: Payer: Self-pay | Admitting: Urology

## 2021-08-08 VITALS — BP 158/87 | HR 89 | Ht 70.0 in | Wt 262.0 lb

## 2021-08-08 DIAGNOSIS — N401 Enlarged prostate with lower urinary tract symptoms: Secondary | ICD-10-CM | POA: Diagnosis present

## 2021-08-08 DIAGNOSIS — R3129 Other microscopic hematuria: Secondary | ICD-10-CM

## 2021-08-08 DIAGNOSIS — N200 Calculus of kidney: Secondary | ICD-10-CM | POA: Diagnosis not present

## 2021-08-08 DIAGNOSIS — N138 Other obstructive and reflux uropathy: Secondary | ICD-10-CM

## 2021-08-08 LAB — URINALYSIS, COMPLETE (UACMP) WITH MICROSCOPIC
Bacteria, UA: NONE SEEN
Bilirubin Urine: NEGATIVE
Glucose, UA: NEGATIVE mg/dL
Ketones, ur: NEGATIVE mg/dL
Leukocytes,Ua: NEGATIVE
Nitrite: NEGATIVE
Protein, ur: NEGATIVE mg/dL
RBC / HPF: NONE SEEN RBC/hpf (ref 0–5)
Specific Gravity, Urine: 1.02 (ref 1.005–1.030)
pH: 7 (ref 5.0–8.0)

## 2021-08-08 NOTE — Progress Notes (Signed)
   08/08/21 CC:  Chief Complaint  Patient presents with   Cysto    HPI: Joe Dunlap is a 61 y.o. male  with a personal history of BPH with LUTS, recurrent nephrolithiasis, ED, hematuria, and diabetes who presents today for further evaluation of urinary frequency.  He underwent UroLift in 08/2019 and had been doing extremely well up until a few months ago, who presents today for cyatosopy.   Pre-2018 s/p ESWL x 1 in remote past.  S/p sucessful right ESWL for a 6 mm proximal ureteral calculus on 2/18  He is undergone extensive evaluation ultimately elected to undergo UroLift in 2021.   He underwent a CT renal stone study on 07/22/2021 to further evaluate hematuria. It visualized Large stone within the inferior pole of the left kidney measuring 11 mm. Multiple 2-5 mm stones throughout the right kidney. Urinary bladder is unremarkable.    Vitals:   08/08/21 1056  BP: (!) 158/87  Pulse: 89   NED. A&Ox3.   No respiratory distress   Abd soft, NT, ND Normal phallus with bilateral descended testicles  Cystoscopy Procedure Note  Patient identification was confirmed, informed consent was obtained, and patient was prepped using Betadine solution.  Lidocaine jelly was administered per urethral meatus.     Pre-Procedure: - Inspection reveals a normal caliber ureteral meatus.  Procedure: The flexible cystoscope was introduced without difficulty - No urethral strictures/lesions are present. - Enlarged prostate Bilobar coaptation  - Anterior channel somewhat obstructed  - Normal bladder neck - Bilateral ureteral orifices identified - Bladder mucosa  reveals no ulcers, tumors, or lesions - No bladder stones - Mild trabeculation wide mouth diverticulum left lateral wall  Retroflexion shows hypervascularity    Post-Procedure: - Patient tolerated the procedure well   Assessment/ Plan: Microscopic hematuria  - s/p cysto fairly unremarkable  - Small stones seen on CT renal stone  study.   2. Bilateral kidney stones - Not bothersome - Managed conservatively   3. BPH with urinary obstruction.  - Cysto demonstrates loss of anterior channel  - Previously had improvement with UroLift may benefit from revision.  - We reviewed the risk of UroLift including pain or burning with urination, blood in the urine, pelvic pain, urgent need to urinate and/or the inability to control the urge. - He understands all of the above and is willing to proceed as planned. - Urine sent for pre-op culture  - Given sample of Myrbetriq 50 mg in the interim x 2 months  I,Avien Taha,acting as a scribe for [name], MD.,have documented all relevant documentation on the behalf of [name], MD,as directed by  [name], MD while in the presence of [name], MD.  I have reviewed the above documentation for accuracy and completeness, and I agree with the above.   [name], MD

## 2021-08-09 LAB — URINE CULTURE: Culture: NO GROWTH

## 2021-08-22 ENCOUNTER — Telehealth: Payer: Self-pay | Admitting: Urology

## 2021-08-27 ENCOUNTER — Other Ambulatory Visit: Payer: Self-pay | Admitting: Urology

## 2021-08-27 ENCOUNTER — Encounter: Payer: Self-pay | Admitting: Urology

## 2021-08-27 ENCOUNTER — Ambulatory Visit: Payer: BC Managed Care – PPO | Admitting: Urology

## 2021-08-27 ENCOUNTER — Ambulatory Visit: Payer: BC Managed Care – PPO | Admitting: Physician Assistant

## 2021-08-27 VITALS — BP 125/78 | HR 85 | Ht 70.0 in | Wt 262.0 lb

## 2021-08-27 DIAGNOSIS — R3129 Other microscopic hematuria: Secondary | ICD-10-CM

## 2021-08-27 DIAGNOSIS — R300 Dysuria: Secondary | ICD-10-CM

## 2021-08-27 DIAGNOSIS — N401 Enlarged prostate with lower urinary tract symptoms: Secondary | ICD-10-CM | POA: Diagnosis not present

## 2021-08-27 DIAGNOSIS — N138 Other obstructive and reflux uropathy: Secondary | ICD-10-CM | POA: Diagnosis not present

## 2021-08-27 DIAGNOSIS — R3989 Other symptoms and signs involving the genitourinary system: Secondary | ICD-10-CM | POA: Diagnosis not present

## 2021-08-27 LAB — MICROSCOPIC EXAMINATION
RBC, Urine: >30 /hpf — AB (ref 0–2)
WBC, UA: >30 /hpf — AB (ref 0–5)

## 2021-08-27 LAB — URINALYSIS, COMPLETE
Bilirubin, UA: NEGATIVE
Glucose, UA: NEGATIVE
Ketones, UA: NEGATIVE
Nitrite, UA: NEGATIVE
Specific Gravity, UA: 1.02 (ref 1.005–1.030)
Urobilinogen, Ur: 0.2 mg/dL (ref 0.2–1.0)
pH, UA: 6.5 (ref 5.0–7.5)

## 2021-08-27 LAB — BLADDER SCAN AMB NON-IMAGING: Scan Result: 53

## 2021-08-27 MED ORDER — SULFAMETHOXAZOLE-TRIMETHOPRIM 800-160 MG PO TABS: 1.0000 | ORAL_TABLET | Freq: Two times a day (BID) | ORAL | 0 refills | Status: DC

## 2021-08-27 NOTE — Progress Notes (Signed)
Surgical Physician Order Form [facility] [place]  * Scheduling expectation : Next Available with Dr. Erlene Quan  *Length of Case:   *Clearance needed: no  *Anticoagulation Instructions: Hold all anticoagulants  *Aspirin Instructions: Hold Aspirin  *Post-op visit Date/Instructions:  4-6 week w/PVR  *Diagnosis: BPH w/LUTS  *Procedure: UroLift  Additional orders: N/A  -Admit type: OUTpatient  -Anesthesia: MAC  -VTE Prophylaxis Standing Order SCD's       Other:   -Standing Lab Orders Per Anesthesia    Lab other: UA&Urine Culture  -Standing Test orders EKG/Chest x-ray per Anesthesia       Test other:   - Medications:  Ancef 2gm IV  -Other orders:  N/A

## 2021-08-27 NOTE — H&P (View-Only) (Signed)
08/27/21 9:59 AM   Joe Dunlap 06-16-1960 [id number]  Referring provider:  Sofie Hartigan, [place],  [place] [address]   Urological history  BPH with LUTS  - He is s/p Urolift in 2021  - Cystoscopy on 08/08/21 with Dr Erlene Quan that showed hypervascularity;  loss of anterior channel .  - Given sampled of Myrbetriq 50 mg for 2 month by Dr Erlene Quan on 08/08/2021   2. Bilateral kidney sones  - CT renal stone study on 07/22/2021 to further evaluate hematuria. It visualized Large stone within the inferior pole of the left kidney measuring 11 mm. Multiple 2-5 mm stones throughout the right kidney. Urinary bladder is unremarkable.   3. Recurrent nephrolithiasis  - Pre-2018 s/p ESWL x 1 in remote past.  S/p sucessful right ESWL for a 6 mm proximal ureteral calculus on 2/18  4. Erectile dysfunction  Chief Complaint  Patient presents with   Dysuria    HPI: Joe Dunlap is a 61 y.o.male who presents today for further evaluation of urinary frequency and dysuria.    He report that a week after his cystoscopy, his symptoms of urinary frequency, dysuria and urge incontinence.  At first he thought he was passing a kidney stone, but he then dismissed the idea because a fragment did not present itself.    UA > 30 WBC's, > 30 RBC's and moderate bacteria.   PVR 53 mL.  Patient denies any modifying or aggravating factors.  Patient denies any gross hematuria or suprapubic/flank pain.  Patient denies any fevers, chills, nausea or vomiting.    He reports that he would like to pursue UroLift.  PMH: Past Medical History:  Diagnosis Date   Coronary artery disease    DDD (degenerative disc disease), lumbar    GERD (gastroesophageal reflux disease)    Headache    not intractable   History of kidney stones    Hyperlipemia    Hypertension    Impaired glucose tolerance    Kidney stones 2018   Labral tear of hip, degenerative    Lumbar radiculitis    Obesity (BMI 30-39.9)     Shingles    left T6   Sleep apnea    USES CPAP   Stomach ulcer     Surgical History: Past Surgical History:  Procedure Laterality Date   COLONOSCOPY WITH PROPOFOL N/A 02/11/2018   Procedure: COLONOSCOPY WITH PROPOFOL;  Surgeon: Lollie Sails, MD;  Location: [facility] ENDOSCOPY;  Service: Endoscopy;  Laterality: N/A;   CYSTOSCOPY WITH INSERTION OF UROLIFT N/A 08/14/2019   Procedure: CYSTOSCOPY WITH INSERTION OF UROLIFT;  Surgeon: Hollice Espy, MD;  Location: [facility] ORS;  Service: Urology;  Laterality: N/A;   EXTRACORPOREAL SHOCK WAVE LITHOTRIPSY Right 06/25/2016   Procedure: EXTRACORPOREAL SHOCK WAVE LITHOTRIPSY (ESWL);  Surgeon: Hollice Espy, MD;  Location: [facility] ORS;  Service: Urology;  Laterality: Right;   GASTRIC ULCER REPAIR     KNEE SURGERY Left    LEFT HEART CATH AND CORONARY ANGIOGRAPHY Left 05/11/2018   Procedure: LEFT HEART CATH AND CORONARY ANGIOGRAPHY;  Surgeon: Isaias Cowman, MD;  Location: [place] [facility];  Service: Cardiovascular;  Laterality: Left;   TONSILLECTOMY     UPPER GI ENDOSCOPY  2008   stomach ulcer   UVULOPALATOPHARYNGOPLASTY (UPPP)/TONSILLECTOMY/SEPTOPLASTY  2000    Home Medications:  Allergies as of 08/27/2021       Reactions   Nsaids Other (See Comments)   Hx of ulcer  Medication List        Accurate as of August 27, 2021  9:59 AM. If you have any questions, ask your nurse or doctor.          STOP taking these medications    mirabegron ER 50 MG Tb24 tablet Commonly known as: MYRBETRIQ Stopped by: Michiel Cowboy, PA-C       TAKE these medications    amLODipine 5 MG tablet Commonly known as: NORVASC Take 5 mg by mouth at bedtime.   mometasone 50 MCG/ACT nasal spray Commonly known as: NASONEX Place 2 sprays into the nose daily.   pravastatin 40 MG tablet Commonly known as: PRAVACHOL Take 40 mg by mouth at bedtime.   sulfamethoxazole-trimethoprim 800-160 MG tablet Commonly known as: BACTRIM DS Take 1  tablet by mouth every 12 (twelve) hours. Started by: Michiel Cowboy, PA-C        Allergies:  Allergies  Allergen Reactions   Nsaids Other (See Comments)    Hx of ulcer    Family History: Family History  Problem Relation Age of Onset   Heart attack Father    Prostatitis Neg Hx    Prostate cancer Neg Hx    Kidney cancer Neg Hx    Bladder Cancer Neg Hx     Social History:  reports that he has never smoked. He has never used smokeless tobacco. He reports current alcohol use. He reports that he does not use drugs.   Physical Exam: BP 125/78   Pulse 85   Ht 5\' 10"  (1.778 m)   Wt 262 lb (118.8 kg)   BMI 37.59 kg/m   Constitutional:  Alert and oriented, No acute distress. HEENT: [place] AT, moist mucus membranes.  Trachea midline Cardiovascular: No clubbing, cyanosis, or edema. Respiratory: Normal respiratory effort, no increased work of breathing. Neurologic: Grossly intact, no focal deficits, moving all 4 extremities. Psychiatric: Normal mood and affect.  Laboratory Data: Urinalysis >30 WBCs, >30 RBCs, moderate bacteria  I have reviewed the labs.   Pertinent Imaging: Results for orders placed or performed in visit on 08/27/21  Bladder Scan (Post Void Residual) in office  Result Value Ref Range   Scan Result 53     Assessment & Plan:    Urinary tract infection  - UA suspicious for infection - Will send for culture and treat in the interim with Bactrim DS x2 daily for 7 days.   2. Microscopic hematuria  - Likely due to infection was offered a KUB to rule out any stone burden. He declined this offer and would like to treat for presumed UTI.   3. BPH with urinary obstruction  - He is emptying adequately with PVR of 53 ml - Underwent cystoscopy with Dr 08/29/21 on 08/08/2021  - He would like to pursue repeat UroLift  - orders for UroLift placed today - will need repeat UA and urine culture prior to UroLift   Return for repeat UA and urine culture prior to UroLift  .   [facility] [address], [address] [place], [place] [phone]  I, [phone] Littlejohn,acting as a scribe for [facility], PA-C.,have documented all relevant documentation on the behalf of Amanda Steuart, PA-C,as directed by  [facility], PA-C while in the presence of Rhyker Silversmith, PA-C.  I have reviewed the above documentation for accuracy and completeness, and I agree with the above.    [facility], PA-C   I spent 30 minutes on the day of the encounter to  include pre-visit record review, face-to-face time with the patient, and post-visit ordering of tests.

## 2021-08-27 NOTE — Progress Notes (Signed)
08/27/21 9:59 AM   Joe Dunlap 05/02/1960 [id number]  Referring provider:  Feldpausch, Dale E, MD [address] [place],  [place] [address]   Urological history  BPH with LUTS  - He is s/p Urolift in 2021  - Cystoscopy on 08/08/21 with Dr Brandon that showed hypervascularity;  loss of anterior channel .  - Given sampled of Myrbetriq 50 mg for 2 month by Dr Brandon on 08/08/2021   2. Bilateral kidney sones  - CT renal stone study on 07/22/2021 to further evaluate hematuria. It visualized Large stone within the inferior pole of the left kidney measuring 11 mm. Multiple 2-5 mm stones throughout the right kidney. Urinary bladder is unremarkable.   3. Recurrent nephrolithiasis  - Pre-2018 s/p ESWL x 1 in remote past.  S/p sucessful right ESWL for a 6 mm proximal ureteral calculus on 2/18  4. Erectile dysfunction  Chief Complaint  Patient presents with   Dysuria    HPI: Joe Dunlap is a 60 y.o.male who presents today for further evaluation of urinary frequency and dysuria.    He report that a week after his cystoscopy, his symptoms of urinary frequency, dysuria and urge incontinence.  At first he thought he was passing a kidney stone, but he then dismissed the idea because a fragment did not present itself.    UA > 30 WBC's, > 30 RBC's and moderate bacteria.   PVR 53 mL.  Patient denies any modifying or aggravating factors.  Patient denies any gross hematuria or suprapubic/flank pain.  Patient denies any fevers, chills, nausea or vomiting.    He reports that he would like to pursue UroLift.  PMH: Past Medical History:  Diagnosis Date   Coronary artery disease    DDD (degenerative disc disease), lumbar    GERD (gastroesophageal reflux disease)    Headache    not intractable   History of kidney stones    Hyperlipemia    Hypertension    Impaired glucose tolerance    Kidney stones 2018   Labral tear of hip, degenerative    Lumbar radiculitis    Obesity (BMI 30-39.9)     Shingles    left T6   Sleep apnea    USES CPAP   Stomach ulcer     Surgical History: Past Surgical History:  Procedure Laterality Date   COLONOSCOPY WITH PROPOFOL N/A 02/11/2018   Procedure: COLONOSCOPY WITH PROPOFOL;  Surgeon: Skulskie, Martin U, MD;  Location: [facility] ENDOSCOPY;  Service: Endoscopy;  Laterality: N/A;   CYSTOSCOPY WITH INSERTION OF UROLIFT N/A 08/14/2019   Procedure: CYSTOSCOPY WITH INSERTION OF UROLIFT;  Surgeon: Brandon, Ashley, MD;  Location: [facility] ORS;  Service: Urology;  Laterality: N/A;   EXTRACORPOREAL SHOCK WAVE LITHOTRIPSY Right 06/25/2016   Procedure: EXTRACORPOREAL SHOCK WAVE LITHOTRIPSY (ESWL);  Surgeon: Ashley Brandon, MD;  Location: [facility] ORS;  Service: Urology;  Laterality: Right;   GASTRIC ULCER REPAIR     KNEE SURGERY Left    LEFT HEART CATH AND CORONARY ANGIOGRAPHY Left 05/11/2018   Procedure: LEFT HEART CATH AND CORONARY ANGIOGRAPHY;  Surgeon: Paraschos, Alexander, MD;  Location: [facility];  Service: Cardiovascular;  Laterality: Left;   TONSILLECTOMY     UPPER GI ENDOSCOPY  2008   stomach ulcer   UVULOPALATOPHARYNGOPLASTY (UPPP)/TONSILLECTOMY/SEPTOPLASTY  2000    Home Medications:  Allergies as of 08/27/2021       Reactions   Nsaids Other (See Comments)   Hx of ulcer          Medication List        Accurate as of August 27, 2021  9:59 AM. If you have any questions, ask your nurse or doctor.          STOP taking these medications    mirabegron ER 50 MG Tb24 tablet Commonly known as: MYRBETRIQ Stopped by: Michiel Cowboy, PA-C       TAKE these medications    amLODipine 5 MG tablet Commonly known as: NORVASC Take 5 mg by mouth at bedtime.   mometasone 50 MCG/ACT nasal spray Commonly known as: NASONEX Place 2 sprays into the nose daily.   pravastatin 40 MG tablet Commonly known as: PRAVACHOL Take 40 mg by mouth at bedtime.   sulfamethoxazole-trimethoprim 800-160 MG tablet Commonly known as: BACTRIM DS Take 1  tablet by mouth every 12 (twelve) hours. Started by: Michiel Cowboy, PA-C        Allergies:  Allergies  Allergen Reactions   Nsaids Other (See Comments)    Hx of ulcer    Family History: Family History  Problem Relation Age of Onset   Heart attack Father    Prostatitis Neg Hx    Prostate cancer Neg Hx    Kidney cancer Neg Hx    Bladder Cancer Neg Hx     Social History:  reports that he has never smoked. He has never used smokeless tobacco. He reports current alcohol use. He reports that he does not use drugs.   Physical Exam: BP 125/78   Pulse 85   Ht 5\' 10"  (1.778 m)   Wt 262 lb (118.8 kg)   BMI 37.59 kg/m   Constitutional:  Alert and oriented, No acute distress. HEENT: [place] AT, moist mucus membranes.  Trachea midline Cardiovascular: No clubbing, cyanosis, or edema. Respiratory: Normal respiratory effort, no increased work of breathing. Neurologic: Grossly intact, no focal deficits, moving all 4 extremities. Psychiatric: Normal mood and affect.  Laboratory Data: Urinalysis >30 WBCs, >30 RBCs, moderate bacteria  I have reviewed the labs.   Pertinent Imaging: Results for orders placed or performed in visit on 08/27/21  Bladder Scan (Post Void Residual) in office  Result Value Ref Range   Scan Result 53     Assessment & Plan:    Urinary tract infection  - UA suspicious for infection - Will send for culture and treat in the interim with Bactrim DS x2 daily for 7 days.   2. Microscopic hematuria  - Likely due to infection was offered a KUB to rule out any stone burden. He declined this offer and would like to treat for presumed UTI.   3. BPH with urinary obstruction  - He is emptying adequately with PVR of 53 ml - Underwent cystoscopy with Dr 08/29/21 on 08/08/2021  - He would like to pursue repeat UroLift  - orders for UroLift placed today - will need repeat UA and urine culture prior to UroLift   Return for repeat UA and urine culture prior to UroLift  .   [facility] [address], [address] [place], [place] [phone]  I, [phone] Littlejohn,acting as a scribe for [facility], PA-C.,have documented all relevant documentation on the behalf of Carma Dwiggins, PA-C,as directed by  [facility], PA-C while in the presence of Rashad Obeid, PA-C.  I have reviewed the above documentation for accuracy and completeness, and I agree with the above.    [facility], PA-C   I spent 30 minutes on the day of the encounter to  include pre-visit record review, face-to-face time with the patient, and post-visit ordering of tests.

## 2021-08-30 LAB — CULTURE, URINE COMPREHENSIVE

## 2021-09-01 ENCOUNTER — Telehealth: Payer: Self-pay

## 2021-09-01 DIAGNOSIS — R3989 Other symptoms and signs involving the genitourinary system: Secondary | ICD-10-CM

## 2021-09-01 MED ORDER — CIPROFLOXACIN HCL 500 MG PO TABS: 500.0000 mg | ORAL_TABLET | Freq: Two times a day (BID) | ORAL | 0 refills | Status: DC

## 2021-09-01 NOTE — Telephone Encounter (Signed)
Notified by detailed message on voicemail per DPR. Script sent into pharmacy

## 2021-09-01 NOTE — Progress Notes (Signed)
[facility] Posting Form   Surgery Date/Time: Date: 09/08/2021  Surgeon: Dr. [name], MD  Surgery Location: Day Surgery  Inpt ( No  )   Outpt (Yes)   Obs ( No  )   Diagnosis: N40.1, N13.8 Benign Prostatic Hyperplasia with Urinary Obstruction  CPT: 52441, [phone]  Surgery: Cystoscopy with insertion of Urolift  Stop Anticoagulations: Yes  Cardiac/Medical/Pulmonary Clearance needed: no  *Orders entered into EPIC  Date: 09/01/21   *Case booked in EPIC  Date: 09/01/21  *Notified pt of Surgery: Date: 09/01/21  PRE-OP UA & CX: yes, obtained last week  *Placed into Prior Authorization Work Que Date: 09/01/21   Assistant/laser/rep:No

## 2021-09-01 NOTE — Telephone Encounter (Signed)
Called patient no answer.

## 2021-09-01 NOTE — Telephone Encounter (Signed)
-----   Message from Harle Battiest, PA-C sent at 08/31/2021  4:32 PM EDT ----- Please let Joe Dunlap know that his urine culture was positive for infection and that he needs to stop the Septra and start Cipro 500 mg, twice daily for seven days.  We also need to repeat the urinalysis and urine culture prior to his UroLift.

## 2021-09-03 NOTE — Telephone Encounter (Signed)
Patient notified, he states he started medication on Monday

## 2021-09-05 ENCOUNTER — Other Ambulatory Visit: Payer: Self-pay

## 2021-09-05 ENCOUNTER — Encounter
Admission: RE | Admit: 2021-09-05 | Discharge: 2021-09-05 | Disposition: A | Discharge status: Home / Self Care | Payer: BC Managed Care – PPO | Source: Ambulatory Visit | Attending: Urology | Admitting: Urology

## 2021-09-05 VITALS — Ht 70.0 in | Wt 262.0 lb

## 2021-09-05 DIAGNOSIS — I10 Essential (primary) hypertension: Secondary | ICD-10-CM

## 2021-09-05 DIAGNOSIS — E785 Hyperlipidemia, unspecified: Secondary | ICD-10-CM

## 2021-09-05 NOTE — Patient Instructions (Addendum)
Your procedure is scheduled on: 09/08/21 Report to DAY SURGERY DEPARTMENT LOCATED ON 2ND FLOOR MEDICAL MALL ENTRANCE. To find out your arrival time please call [phone] between 1PM - 3PM on 09/05/21.  Remember: Instructions that are not followed completely may result in serious medical risk, up to and including death, or upon the discretion of your surgeon and anesthesiologist your surgery may need to be rescheduled.     _X__ 1. Do not eat food or drink liquids after midnight the night before your procedure.                 No gum chewing or hard candies.   __X__2.  On the morning of surgery brush your teeth with toothpaste and water, you                 may rinse your mouth with mouthwash if you wish.  Do not swallow any              toothpaste of mouthwash.     _X__ 3.  No Alcohol for 24 hours before or after surgery.   _X__ 4.  Do Not Smoke or use e-cigarettes For 24 Hours Prior to Your Surgery.                 Do not use any chewable tobacco products for at least 6 hours prior to                 surgery.  ____  5.  Bring all medications with you on the day of surgery if instructed.   __X__  6.  Notify your doctor if there is any change in your medical condition      (cold, fever, infections).     Do not wear jewelry, make-up, hairpins, clips or nail polish. Do not wear lotions, powders, or perfumes.  Do not shave body hair 48 hours prior to surgery. Men may shave face and neck. Do not bring valuables to the hospital.    [facility] is not responsible for any belongings or valuables.  Contacts, dentures/partials or body piercings may not be worn into surgery. Bring a case for your contacts, glasses or hearing aids, a denture cup will be supplied. Leave your suitcase in the car. After surgery it may be brought to your room. For patients admitted to the hospital, discharge time is determined by your treatment team.   Patients discharged the day of surgery will not be allowed to  drive home.   Please read over the following fact sheets that you were given:     __X__ Take these medicines the morning of surgery with A SIP OF WATER:    1. Omeprazole/prilosec  2.   3.   4.  5.  6.  ____ Fleet Enema (as directed)   ____ Use CHG Soap/SAGE wipes as directed  ____ Use inhalers on the day of surgery  ____ Stop metformin/Janumet/Farxiga 2 days prior to surgery    ____ Take 1/2 of usual insulin dose the night before surgery. No insulin the morning          of surgery.   ____ Stop Blood Thinners Coumadin/Plavix/Xarelto/Pleta/Pradaxa/Eliquis/Effient/Aspirin  on   Or contact your Surgeon, Cardiologist or Medical Doctor regarding  ability to stop your blood thinners  __X__ Stop Anti-inflammatories 7 days before surgery such as Advil, Ibuprofen, Motrin,  BC or Goodies Powder, Naprosyn, Naproxen, Aleve, Aspirin    __X__ Stop all herbals and supplements, fish oil or vitamins  starting today 09/05/21 until after surgery.    ____ Bring C-Pap to the hospital.

## 2021-09-07 MED ORDER — CEFAZOLIN SODIUM-DEXTROSE 2-4 GM/100ML-% IV SOLN
2.0000 g | INTRAVENOUS | Status: AC
  Administered 2021-09-08: 2 g via INTRAVENOUS

## 2021-09-07 MED ORDER — CHLORHEXIDINE GLUCONATE 0.12 % MT SOLN: 15.0000 mL | Freq: Once | OROMUCOSAL | Status: AC

## 2021-09-07 MED ORDER — ORAL CARE MOUTH RINSE: 15.0000 mL | Freq: Once | OROMUCOSAL | Status: AC

## 2021-09-07 MED ORDER — LACTATED RINGERS IV SOLN: INTRAVENOUS | Status: DC

## 2021-09-08 ENCOUNTER — Ambulatory Visit: Payer: BC Managed Care – PPO | Admitting: Certified Registered"

## 2021-09-08 ENCOUNTER — Encounter: Payer: Self-pay | Admitting: Urology

## 2021-09-08 ENCOUNTER — Ambulatory Visit
Admission: RE | Admit: 2021-09-08 | Discharge: 2021-09-08 | Disposition: A | Discharge status: Home / Self Care | Payer: BC Managed Care – PPO | Attending: Urology | Admitting: Urology

## 2021-09-08 ENCOUNTER — Ambulatory Visit: Payer: BC Managed Care – PPO

## 2021-09-08 ENCOUNTER — Encounter
Admission: RE | Disposition: A | Discharge status: Home / Self Care | Payer: Self-pay | Source: Home / Self Care | Attending: Urology

## 2021-09-08 ENCOUNTER — Other Ambulatory Visit: Payer: Self-pay

## 2021-09-08 DIAGNOSIS — N138 Other obstructive and reflux uropathy: Secondary | ICD-10-CM | POA: Diagnosis not present

## 2021-09-08 DIAGNOSIS — I2510 Atherosclerotic heart disease of native coronary artery without angina pectoris: Secondary | ICD-10-CM | POA: Diagnosis not present

## 2021-09-08 DIAGNOSIS — N529 Male erectile dysfunction, unspecified: Secondary | ICD-10-CM | POA: Diagnosis not present

## 2021-09-08 DIAGNOSIS — Z87442 Personal history of urinary calculi: Secondary | ICD-10-CM | POA: Insufficient documentation

## 2021-09-08 DIAGNOSIS — N401 Enlarged prostate with lower urinary tract symptoms: Secondary | ICD-10-CM | POA: Diagnosis present

## 2021-09-08 DIAGNOSIS — Z8711 Personal history of peptic ulcer disease: Secondary | ICD-10-CM | POA: Diagnosis not present

## 2021-09-08 DIAGNOSIS — I10 Essential (primary) hypertension: Secondary | ICD-10-CM | POA: Insufficient documentation

## 2021-09-08 DIAGNOSIS — E785 Hyperlipidemia, unspecified: Secondary | ICD-10-CM

## 2021-09-08 HISTORY — PX: CYSTOSCOPY WITH INSERTION OF UROLIFT: SHX6678

## 2021-09-08 SURGERY — CYSTOSCOPY WITH INSERTION OF UROLIFT
Anesthesia: Monitor Anesthesia Care

## 2021-09-08 MED ORDER — DEXMEDETOMIDINE HCL IN NACL 200 MCG/50ML IV SOLN
INTRAVENOUS | Status: DC | PRN
  Administered 2021-09-08: 8 ug via INTRAVENOUS
  Administered 2021-09-08: 4 ug via INTRAVENOUS

## 2021-09-08 MED ORDER — PROPOFOL 10 MG/ML IV BOLUS
INTRAVENOUS | Status: DC | PRN
  Administered 2021-09-08: 60 mg via INTRAVENOUS
  Administered 2021-09-08 (×2): 10 mg via INTRAVENOUS

## 2021-09-08 MED ORDER — MIDAZOLAM HCL 2 MG/2ML IJ SOLN
INTRAMUSCULAR | Status: AC
  Filled 2021-09-08: qty 2

## 2021-09-08 MED ORDER — PHENYLEPHRINE HCL (PRESSORS) 10 MG/ML IV SOLN
INTRAVENOUS | Status: DC | PRN
  Administered 2021-09-08: 160 ug via INTRAVENOUS

## 2021-09-08 MED ORDER — CEFAZOLIN SODIUM-DEXTROSE 2-4 GM/100ML-% IV SOLN
INTRAVENOUS | Status: AC
  Filled 2021-09-08: qty 100

## 2021-09-08 MED ORDER — PROPOFOL 500 MG/50ML IV EMUL
INTRAVENOUS | Status: DC | PRN
  Administered 2021-09-08: 165 ug/kg/min via INTRAVENOUS

## 2021-09-08 MED ORDER — FENTANYL CITRATE (PF) 100 MCG/2ML IJ SOLN
INTRAMUSCULAR | Status: AC
  Filled 2021-09-08: qty 2

## 2021-09-08 MED ORDER — FENTANYL CITRATE (PF) 100 MCG/2ML IJ SOLN: 25.0000 ug | INTRAMUSCULAR | Status: DC | PRN

## 2021-09-08 MED ORDER — FENTANYL CITRATE (PF) 100 MCG/2ML IJ SOLN
INTRAMUSCULAR | Status: DC | PRN
  Administered 2021-09-08: 25 ug via INTRAVENOUS

## 2021-09-08 MED ORDER — ONDANSETRON HCL 4 MG/2ML IJ SOLN
INTRAMUSCULAR | Status: DC | PRN
  Administered 2021-09-08: 4 mg via INTRAVENOUS

## 2021-09-08 MED ORDER — CHLORHEXIDINE GLUCONATE 0.12 % MT SOLN
OROMUCOSAL | Status: AC
  Administered 2021-09-08: 15 mL via OROMUCOSAL
  Filled 2021-09-08: qty 15

## 2021-09-08 MED ORDER — MIDAZOLAM HCL 2 MG/2ML IJ SOLN
INTRAMUSCULAR | Status: DC | PRN
  Administered 2021-09-08: 2 mg via INTRAVENOUS

## 2021-09-08 MED ORDER — OXYCODONE HCL 5 MG PO TABS
ORAL_TABLET | ORAL | Status: AC
  Filled 2021-09-08: qty 1

## 2021-09-08 MED ORDER — DEXAMETHASONE SODIUM PHOSPHATE 10 MG/ML IJ SOLN
INTRAMUSCULAR | Status: DC | PRN
  Administered 2021-09-08: 10 mg via INTRAVENOUS

## 2021-09-08 MED ORDER — GLYCOPYRROLATE 0.2 MG/ML IJ SOLN
INTRAMUSCULAR | Status: DC | PRN
  Administered 2021-09-08: .2 mg via INTRAVENOUS

## 2021-09-08 MED ORDER — OXYCODONE HCL 5 MG PO TABS
5.0000 mg | ORAL_TABLET | Freq: Once | ORAL | Status: AC | PRN
  Administered 2021-09-08: 5 mg via ORAL

## 2021-09-08 MED ORDER — OXYCODONE HCL 5 MG/5ML PO SOLN: 5.0000 mg | Freq: Once | ORAL | Status: AC | PRN

## 2021-09-08 SURGICAL SUPPLY — 15 items
BAG DRAIN CYSTO-URO LG1000N (MISCELLANEOUS) ×2 IMPLANT
GAUZE 4X4 16PLY ~~LOC~~+RFID DBL (SPONGE) ×3 IMPLANT
GLOVE BIO SURGEON STRL SZ 6.5 (GLOVE) ×2 IMPLANT
GOWN STRL REUS W/ TWL LRG LVL3 (GOWN DISPOSABLE) ×2 IMPLANT
GOWN STRL REUS W/TWL LRG LVL3 (GOWN DISPOSABLE) ×4
KIT TURNOVER CYSTO (KITS) ×2 IMPLANT
MANIFOLD NEPTUNE II (INSTRUMENTS) ×1 IMPLANT
PACK CYSTO AR (MISCELLANEOUS) ×2 IMPLANT
SET CYSTO W/LG BORE CLAMP LF (SET/KITS/TRAYS/PACK) ×2 IMPLANT
SURGILUBE 2OZ TUBE FLIPTOP (MISCELLANEOUS) IMPLANT
SYSTEM UROLIFT 2 CART W/ HNDL (Male Continence) ×2 IMPLANT
SYSTEM UROLIFT 2 CARTRIDGE (Male Continence) ×5 IMPLANT
WATER STERILE IRR 1000ML POUR (IV SOLUTION) ×2 IMPLANT
WATER STERILE IRR 3000ML UROMA (IV SOLUTION) ×2 IMPLANT
WATER STERILE IRR 500ML POUR (IV SOLUTION) ×2 IMPLANT

## 2021-09-08 NOTE — Interval H&P Note (Signed)
History and Physical Interval Note:  09/08/2021 10:07 AM  Joe Dunlap  has presented today for surgery, with the diagnosis of Benign Prostatic Hyperplasia with Lower Urinary Tract Symptoms.  The various methods of treatment have been discussed with the patient and family. After consideration of risks, benefits and other options for treatment, the patient has consented to  Procedure(s): CYSTOSCOPY WITH INSERTION OF UROLIFT (N/A) as a surgical intervention.  The patient's history has been reviewed, patient examined, no change in status, stable for surgery.  I have reviewed the patient's chart and labs.  Questions were answered to the patient's satisfaction.    CTAB RRR  [name]

## 2021-09-08 NOTE — Anesthesia Preprocedure Evaluation (Addendum)
Anesthesia Evaluation  Patient identified by MRN, date of birth, ID band Patient awake    Reviewed: Allergy & Precautions, NPO status , Patient's Chart, lab work & pertinent test results  History of Anesthesia Complications Negative for: history of anesthetic complications  Airway Mallampati: III  TM Distance: >3 FB Neck ROM: full    Dental  (+) Chipped   Pulmonary neg pulmonary ROS, sleep apnea ,    Pulmonary exam normal        Cardiovascular hypertension, + CAD  negative cardio ROS Normal cardiovascular exam     Neuro/Psych  Headaches, negative neurological ROS  negative psych ROS   GI/Hepatic negative GI ROS, Neg liver ROS, PUD, GERD  ,  Endo/Other  negative endocrine ROS  Renal/GU Renal disease  negative genitourinary   Musculoskeletal   Abdominal   Peds  Hematology negative hematology ROS (+)   Anesthesia Other Findings Past Medical History: No date: Coronary artery disease No date: DDD (degenerative disc disease), lumbar No date: GERD (gastroesophageal reflux disease) No date: Headache     Comment:  not intractable No date: History of kidney stones No date: Hyperlipemia No date: Hypertension No date: Impaired glucose tolerance 2018: Kidney stones No date: Labral tear of hip, degenerative No date: Lumbar radiculitis No date: Obesity (BMI 30-39.9) No date: Shingles     Comment:  left T6 No date: Sleep apnea     Comment:  USES CPAP No date: Stomach ulcer  Past Surgical History: 02/11/2018: COLONOSCOPY WITH PROPOFOL; N/A     Comment:  Procedure: COLONOSCOPY WITH PROPOFOL;  Surgeon:               Lollie Sails, MD;  Location: [facility] ENDOSCOPY;                Service: Endoscopy;  Laterality: N/A; 08/14/2019: CYSTOSCOPY WITH INSERTION OF UROLIFT; N/A     Comment:  Procedure: CYSTOSCOPY WITH INSERTION OF UROLIFT;                Surgeon: Hollice Espy, MD;  Location: [facility] ORS;                 Service: Urology;  Laterality: N/A; 06/25/2016: EXTRACORPOREAL SHOCK WAVE LITHOTRIPSY; Right     Comment:  Procedure: EXTRACORPOREAL SHOCK WAVE LITHOTRIPSY (ESWL);              Surgeon: Hollice Espy, MD;  Location: [facility] ORS;                Service: Urology;  Laterality: Right; No date: GASTRIC ULCER REPAIR No date: KNEE SURGERY; Left 05/11/2018: LEFT HEART CATH AND CORONARY ANGIOGRAPHY; Left     Comment:  Procedure: LEFT HEART CATH AND CORONARY ANGIOGRAPHY;                Surgeon: Isaias Cowman, MD;  Location: [facility];  Service: Cardiovascular;  Laterality:               Left; No date: TONSILLECTOMY 2008: UPPER GI ENDOSCOPY     Comment:  stomach ulcer 2000: UVULOPALATOPHARYNGOPLASTY (UPPP)/TONSILLECTOMY/SEPTOPLASTY  BMI    Body Mass Index: 37.58 kg/m      Reproductive/Obstetrics negative OB ROS                            Anesthesia Physical Anesthesia Plan  ASA: 2  Anesthesia Plan: MAC  Post-op Pain Management:    Induction:   PONV Risk Score and Plan: Propofol infusion and TIVA  Airway Management Planned:   Additional Equipment:   Intra-op Plan:   Post-operative Plan:   Informed Consent: I have reviewed the patients History and Physical, chart, labs and discussed the procedure including the risks, benefits and alternatives for the proposed anesthesia with the patient or authorized representative who has indicated his/her understanding and acceptance.     Dental Advisory Given  Plan Discussed with: Anesthesiologist, CRNA and Surgeon  Anesthesia Plan Comments:         Anesthesia Quick Evaluation

## 2021-09-08 NOTE — Anesthesia Postprocedure Evaluation (Signed)
Anesthesia Post Note  Patient: Joe Dunlap  Procedure(s) Performed: CYSTOSCOPY WITH INSERTION OF UROLIFT  Patient location during evaluation: PACU Anesthesia Type: MAC Level of consciousness: awake and alert Pain management: pain level controlled Vital Signs Assessment: post-procedure vital signs reviewed and stable Respiratory status: spontaneous breathing, nonlabored ventilation, respiratory function stable and patient connected to nasal cannula oxygen Cardiovascular status: blood pressure returned to baseline and stable Postop Assessment: no apparent nausea or vomiting Anesthetic complications: no   No notable events documented.   Last Vitals:  Vitals:   09/08/21 1043 09/08/21 1049  BP: 108/71 112/68  Pulse: 91 93  Resp: 15 18  Temp: (!) 36.2 C   SpO2: 94% 95%    Last Pain:  Vitals:   09/08/21 1049  TempSrc:   PainSc: 0-No pain                 Dimas Millin

## 2021-09-08 NOTE — Discharge Instructions (Addendum)
Urolift Post-Operative Instructions     Patient Expectations   1. Mild blood in your urine for about 1 week.  2. Urinary buring, frequency, and urgency for 10 days.  3. Mild pelvic pain 1-2 weeks.     Return to Activity     1. Drink water post procedure.  2. Take meds as needed.  Tylenol and/or Motrin is most helpful.  You may also by Pyridium/Azo over-the-counter for urinary burning.  3. No lifting or straining 48hrs.  4. Other activity when they feel up to it.  AMBULATORY SURGERY  DISCHARGE INSTRUCTIONS   The drugs that you were given will stay in your system until tomorrow so for the next 24 hours you should not:  Drive an automobile Make any legal decisions Drink any alcoholic beverage   You may resume regular meals tomorrow.  Today it is better to start with liquids and gradually work up to solid foods.  You may eat anything you prefer, but it is better to start with liquids, then soup and crackers, and gradually work up to solid foods.   Please notify your doctor immediately if you have any unusual bleeding, trouble breathing, redness and pain at the surgery site, drainage, fever, or pain not relieved by medication.    Additional Instructions:     Please contact your physician with any problems or Same Day Surgery at [phone], Monday through Friday 6 am to 4 pm, or [facility] Main number at [phone].  

## 2021-09-08 NOTE — Op Note (Signed)
Preoperative diagnosis: BPH with obstructive symptomatology   Postoperative diagnosis: BPH with obstructive symptomatology   Principal procedure: Urolift procedure, with the placement of 4 implants.   Surgeon: Hollice Espy   Anesthesia: MAC   Complications: None   Drains: None   Estimated blood loss: < 5 mL   Indications: 61year-old male with obstructive symptomatology secondary to BPH who is status post UroLift 2 years ago initially had an excellent response but now with recurrent symptoms and cystoscopic loss of the anterior channel.  The patient's symptoms have progressed, and he has requested further management.  Management options including TURP with resection/ablation of the prostate as well as Urolift were discussed.  The patient has chosen to have a Urolift procedure.  He has been instructed to the procedure as well as risks and complications which include but are not limited to infection, bleeding, and inadequate treatment with the Urolift procedure alone, anesthetic complications, among others.  He understands these and desires to proceed.     Description of procedure: The patient was properly identified in the holding area.  He received preoperative IV antibiotics.  He was taken to the operating room where MAC.  He is placed in the dorsolithotomy position.  Genitalia and perineum were prepped and draped.  Proper timeout was performed.   A 51F cystoscope initially was unable to be inserted due to an extremely high bladder neck.  The ended up delivering the first implant to the left prostatic apex near the level of the verumontanum which subsequently opened up the prostatic fossa and I was able to easily navigate into the bladder.  The bladder was unremarkable.  The second implant was delivered at the right prostatic apex to mirror the first.  I then delivered 2 additional implants about 1.5 cm from each of the bladder neck and a very anterior plane.  This created a nice open  prostatic fossa.  Notably, the some of the previous clips were noted and visualized.     A final cystoscopy was conducted first to inspect the location and state of each implant and second, to confirm the presence of a continuous anterior channel was present through the prostatic urethra with irrigation flow turned off.    4 implants were delivered in total.    Following this, the scope was removed.  After anesthetic reversal he was transported to the PACU in stable condition.  He tolerated the procedure well.   Plan: He will follow-up with me in 4 to 6 weeks for IPSS/PVR.

## 2021-09-08 NOTE — Transfer of Care (Signed)
Immediate Anesthesia Transfer of Care Note  Patient: Joe Dunlap  Procedure(s) Performed: CYSTOSCOPY WITH INSERTION OF UROLIFT  Patient Location: PACU  Anesthesia Type:General  Level of Consciousness: drowsy and patient cooperative  Airway & Oxygen Therapy: Patient Spontanous Breathing and Patient connected to face mask oxygen  Post-op Assessment: Report given to RN and Post -op Vital signs reviewed and stable  Post vital signs: Reviewed and stable  Last Vitals:  Vitals Value Taken Time  BP 112/68 09/08/21 1045  Temp    Pulse 88 09/08/21 1046  Resp 15 09/08/21 1046  SpO2 94 % 09/08/21 1046  Vitals shown include unvalidated device data.  Last Pain:  Vitals:   09/08/21 0834  TempSrc: Temporal  PainSc: 0-No pain         Complications: No notable events documented.

## 2021-09-08 NOTE — Anesthesia Procedure Notes (Signed)
Procedure Name: General with mask airway Date/Time: 09/08/2021 10:35 AM  Performed by: Mohammed Kindle, CRNAPre-anesthesia Checklist: Patient identified, Suction available, Emergency Drugs available and Patient being monitored Patient Re-evaluated:Patient Re-evaluated prior to induction Oxygen Delivery Method: Simple face mask Induction Type: IV induction Ventilation: Oral airway inserted - appropriate to patient size Placement Confirmation: positive ETCO2, CO2 detector and breath sounds checked- equal and bilateral Dental Injury: Teeth and Oropharynx as per pre-operative assessment

## 2021-09-09 ENCOUNTER — Encounter: Payer: Self-pay | Admitting: Urology

## 2021-10-10 ENCOUNTER — Encounter: Payer: Self-pay | Admitting: Urology

## 2021-10-10 ENCOUNTER — Ambulatory Visit: Payer: BC Managed Care – PPO | Admitting: Urology

## 2021-10-10 VITALS — BP 137/77 | HR 93 | Ht 70.0 in | Wt 257.0 lb

## 2021-10-10 DIAGNOSIS — N138 Other obstructive and reflux uropathy: Secondary | ICD-10-CM

## 2021-10-10 DIAGNOSIS — N401 Enlarged prostate with lower urinary tract symptoms: Secondary | ICD-10-CM | POA: Diagnosis not present

## 2021-10-10 LAB — BLADDER SCAN AMB NON-IMAGING

## 2021-10-10 NOTE — Progress Notes (Signed)
10/10/21 11:35 AM   Joe Dunlap 1960/08/02 [id number]  Referring provider:  Marina Goodell, MD [address] [place],  [place] [address] Chief Complaint  Patient presents with   Benign Prostatic Hypertrophy    4-6wk w/PVR      HPI: Joe Dunlap is a 61 y.o.male with a personal history of BPH with LUTS, recurrent nephrolithiasis, ED, hematuria, and diabetes who presents today for a 4-6 week post-op follow-up with PVR.   He is status post urolift and 08/20/19.And had been doing extremely well up until a few months ago.   Pre-2018 s/p ESWL x 1 in remote past.  S/p successful right ESWL for a 6 mm proximal ureteral calculus on 2/18   He underwent extensive evaluation, ultimately elected to undergo UroLift in 2021.    He underwent a CT renal stone study on 07/22/2021 to further evaluate hematuria. It visualized Large stone within the inferior pole of the left kidney measuring 11 mm. Multiple 2-5 mm stones throughout the right kidney. Urinary bladder is unremarkable.   His repeat Urolift was preceded by a UTI on 08/27/2021 that grew Enterococcus faecalis.   He underwent a repeat Urolift with placement of 4 implants on 09/08/2021.  He reports that he has improvement in his urinary, doing extremely well.  Reports first several days were rough with frequency.  He had some burning and blood initially but all of this is resolved.  He is now having significant less frequency, no meds, and extremely pleased with his voiding.      IPSS     Row Name 10/10/21 1100         International Prostate Symptom Score   How often have you had the sensation of not emptying your bladder? Not at All     How often have you had to urinate less than every two hours? About half the time     How often have you found you stopped and started again several times when you urinated? Not at All     How often have you found it difficult to postpone urination? Less than 1 in 5 times     How often  have you had a weak urinary stream? Not at All     How often have you had to strain to start urination? Not at All     How many times did you typically get up at night to urinate? 2 Times     Total IPSS Score 6       Quality of Life due to urinary symptoms   If you were to spend the rest of your life with your urinary condition just the way it is now how would you feel about that? Mostly Satisfied              Score:  1-7 Mild 8-19 Moderate 20-35 Severe   PMH: Past Medical History:  Diagnosis Date   Coronary artery disease    DDD (degenerative disc disease), lumbar    GERD (gastroesophageal reflux disease)    Headache    not intractable   History of kidney stones    Hyperlipemia    Hypertension    Impaired glucose tolerance    Kidney stones 2018   Labral tear of hip, degenerative    Lumbar radiculitis    Obesity (BMI 30-39.9)    Shingles    left T6   Sleep apnea    USES CPAP   Stomach ulcer  Surgical History: Past Surgical History:  Procedure Laterality Date   COLONOSCOPY WITH PROPOFOL N/A 02/11/2018   Procedure: COLONOSCOPY WITH PROPOFOL;  Surgeon: Christena Deem, MD;  Location: [facility] ENDOSCOPY;  Service: Endoscopy;  Laterality: N/A;   CYSTOSCOPY WITH INSERTION OF UROLIFT N/A 08/14/2019   Procedure: CYSTOSCOPY WITH INSERTION OF UROLIFT;  Surgeon: [name], MD;  Location: [facility] ORS;  Service: Urology;  Laterality: N/A;   CYSTOSCOPY WITH INSERTION OF UROLIFT N/A 09/08/2021   Procedure: CYSTOSCOPY WITH INSERTION OF UROLIFT;  Surgeon: [name], MD;  Location: [facility] ORS;  Service: Urology;  Laterality: N/A;   EXTRACORPOREAL SHOCK WAVE LITHOTRIPSY Right 06/25/2016   Procedure: EXTRACORPOREAL SHOCK WAVE LITHOTRIPSY (ESWL);  Surgeon: [name], MD;  Location: [facility] ORS;  Service: Urology;  Laterality: Right;   GASTRIC ULCER REPAIR     KNEE SURGERY Left    LEFT HEART CATH AND CORONARY ANGIOGRAPHY Left 05/11/2018   Procedure: LEFT HEART CATH AND  CORONARY ANGIOGRAPHY;  Surgeon: Marcina Millard, MD;  Location: [facility] INVASIVE CV LAB;  Service: Cardiovascular;  Laterality: Left;   TONSILLECTOMY     UPPER GI ENDOSCOPY  2008   stomach ulcer   UVULOPALATOPHARYNGOPLASTY (UPPP)/TONSILLECTOMY/SEPTOPLASTY  2000    Home Medications:  Allergies as of 10/10/2021       Reactions   Nsaids Other (See Comments)   Hx of ulcer        Medication List        Accurate as of October 10, 2021 11:35 AM. If you have any questions, ask your nurse or doctor.          STOP taking these medications    ciprofloxacin 500 MG tablet Commonly known as: CIPRO Stopped by: [name], MD       TAKE these medications    amLODipine 5 MG tablet Commonly known as: NORVASC Take 5 mg by mouth at bedtime.   cyclobenzaprine 10 MG tablet Commonly known as: FLEXERIL Take 5-10 mg by mouth 2 (two) times daily as needed for muscle spasms.   omeprazole 20 MG capsule Commonly known as: PRILOSEC Take 20 mg by mouth daily.   pravastatin 40 MG tablet Commonly known as: PRAVACHOL Take 40 mg by mouth at bedtime.        Allergies:  Allergies  Allergen Reactions   Nsaids Other (See Comments)    Hx of ulcer    Family History: Family History  Problem Relation Age of Onset   Heart attack Father    Prostatitis Neg Hx    Prostate cancer Neg Hx    Kidney cancer Neg Hx    Bladder Cancer Neg Hx     Social History:  reports that he has never smoked. He has never used smokeless tobacco. He reports current alcohol use of about 5.0 standard drinks of alcohol per week. He reports that he does not use drugs.   Physical Exam: BP 137/77   Pulse 93   Ht 5\' 10"  (1.778 m)   Wt 257 lb (116.6 kg)   BMI 36.88 kg/m   Constitutional:  Alert and oriented, No acute distress. HEENT: [place] AT, moist mucus membranes.  Trachea midline, no masses. Cardiovascular: No clubbing, cyanosis, or edema. Respiratory: Normal respiratory effort, no increased work of  breathing. Skin: No rashes, bruises or suspicious lesions. Neurologic: Grossly intact, no focal deficits, moving all 4 extremities. Psychiatric: Normal mood and affect.  Laboratory Data:  Lab Results  Component Value Date   CREATININE 0.83 08/02/2020    Pertinent Imaging: Results  for orders placed or performed in visit on 10/10/21  BLADDER SCAN AMB NON-IMAGING  Result Value Ref Range   Scan Result 37ml     Assessment & Plan:   BPH with urinary obstruction - S/p UroLift   -Symptomatically doing extremely well, emptying with resolution of both obstructive and irritative symptoms  In 1 year with IPSS/PVR/PSA/DRE    Jilda Panda Littlejohn,acting as a scribe for [name], MD.,have documented all relevant documentation on the behalf of [name], MD,as directed by  [name], MD while in the presence of [name], MD.  I have reviewed the above documentation for accuracy and completeness, and I agree with the above.   [name], MD   [facility] [address] [place], [place] [address] [phone]

## 2022-02-25 ENCOUNTER — Ambulatory Visit
Admission: EM | Admit: 2022-02-25 | Discharge: 2022-02-25 | Disposition: A | Discharge status: Home / Self Care | Payer: BC Managed Care – PPO | Attending: Physician Assistant | Admitting: Physician Assistant

## 2022-02-25 DIAGNOSIS — U071 COVID-19: Secondary | ICD-10-CM | POA: Diagnosis present

## 2022-02-25 DIAGNOSIS — J069 Acute upper respiratory infection, unspecified: Secondary | ICD-10-CM | POA: Diagnosis present

## 2022-02-25 DIAGNOSIS — Z20822 Contact with and (suspected) exposure to covid-19: Secondary | ICD-10-CM | POA: Insufficient documentation

## 2022-02-25 LAB — SARS CORONAVIRUS 2 BY RT PCR: SARS Coronavirus 2 by RT PCR: POSITIVE — AB

## 2022-02-25 MED ORDER — PROMETHAZINE-DM 6.25-15 MG/5ML PO SYRP: 5.0000 mL | ORAL_SOLUTION | Freq: Four times a day (QID) | ORAL | 0 refills | Status: DC | PRN

## 2022-02-25 NOTE — ED Provider Notes (Signed)
[facility]    CSN: [id number] Arrival date & time: 02/25/22  1342      History   Chief Complaint Chief Complaint  Patient presents with   URI    [phone] In Car   Cough    HPI Joe Dunlap is a 61 y.o. male presenting for 6-day history of cough and congestion.  Patient says cough is productive.  He reports at the onset he did have chills and flulike symptoms but that resolved in a couple of days and he has not had those symptoms since.  He denies a fever at this time.  He has not had any sinus pain/pressure, sore throat, chest pain or breathing difficulty.  He reports that he has been exposed to COVID-19.  Also reports that another coworker told him that they had an upper respiratory infection and he needed to get checked out for that.  He has been taking over-the-counter cough medications without relief.  He says did not really work for him.  No other complaints.  HPI  Past Medical History:  Diagnosis Date   Coronary artery disease    DDD (degenerative disc disease), lumbar    GERD (gastroesophageal reflux disease)    Headache    not intractable   History of kidney stones    Hyperlipemia    Hypertension    Impaired glucose tolerance    Kidney stones 2018   Labral tear of hip, degenerative    Lumbar radiculitis    Obesity (BMI 30-39.9)    Shingles    left T6   Sleep apnea    USES CPAP   Stomach ulcer     Patient Active Problem List   Diagnosis Date Noted   Chronic tension-type headache, not intractable 03/04/2017   Impaired glucose tolerance 01/09/2014   Shingles 10/19/2013   Right hip pain 09/20/2013   DDD (degenerative disc disease), lumbar 09/07/2013   Labral tear of hip, degenerative 09/07/2013   Lumbar radiculitis 09/07/2013   Elevated fasting blood sugar 08/03/2013   GERD (gastroesophageal reflux disease) 08/03/2013   Hyperlipidemia, unspecified 08/03/2013   Hypertension, essential, benign 08/03/2013   Low back pain 08/03/2013    Abdominal pain 07/31/2013    Past Surgical History:  Procedure Laterality Date   COLONOSCOPY WITH PROPOFOL N/A 02/11/2018   Procedure: COLONOSCOPY WITH PROPOFOL;  Surgeon: Christena Deem, MD;  Location: [facility] ENDOSCOPY;  Service: Endoscopy;  Laterality: N/A;   CYSTOSCOPY WITH INSERTION OF UROLIFT N/A 08/14/2019   Procedure: CYSTOSCOPY WITH INSERTION OF UROLIFT;  Surgeon: [name], MD;  Location: [facility] ORS;  Service: Urology;  Laterality: N/A;   CYSTOSCOPY WITH INSERTION OF UROLIFT N/A 09/08/2021   Procedure: CYSTOSCOPY WITH INSERTION OF UROLIFT;  Surgeon: [name], MD;  Location: [facility] ORS;  Service: Urology;  Laterality: N/A;   EXTRACORPOREAL SHOCK WAVE LITHOTRIPSY Right 06/25/2016   Procedure: EXTRACORPOREAL SHOCK WAVE LITHOTRIPSY (ESWL);  Surgeon: [name], MD;  Location: [facility] ORS;  Service: Urology;  Laterality: Right;   GASTRIC ULCER REPAIR     KNEE SURGERY Left    LEFT HEART CATH AND CORONARY ANGIOGRAPHY Left 05/11/2018   Procedure: LEFT HEART CATH AND CORONARY ANGIOGRAPHY;  Surgeon: Marcina Millard, MD;  Location: [facility] INVASIVE CV LAB;  Service: Cardiovascular;  Laterality: Left;   TONSILLECTOMY     UPPER GI ENDOSCOPY  2008   stomach ulcer   UVULOPALATOPHARYNGOPLASTY (UPPP)/TONSILLECTOMY/SEPTOPLASTY  2000       Home Medications    Prior to Admission medications   Medication  Sig Start Date End Date Taking? Authorizing Provider  amLODipine (NORVASC) 5 MG tablet Take 5 mg by mouth at bedtime.    Yes [provider]  Famotidine (PEPCID AC PO) Take by mouth daily with breakfast.   Yes [provider]  pravastatin (PRAVACHOL) 40 MG tablet Take 40 mg by mouth at bedtime.   Yes [provider]  promethazine-dextromethorphan (PROMETHAZINE-DM) 6.25-15 MG/5ML syrup Take 5 mLs by mouth 4 (four) times daily as needed. 02/25/22  Yes Eusebio Friendly B, PA-C  cyclobenzaprine (FLEXERIL) 10 MG tablet Take 5-10 mg by mouth 2 (two) times daily as  needed for muscle spasms.    [provider]  omeprazole (PRILOSEC OTC) 20 MG tablet Take 20 mg by mouth every morning.  09/26/19  [provider]    Family History Family History  Problem Relation Age of Onset   Heart attack Father    Prostatitis Neg Hx    Prostate cancer Neg Hx    Kidney cancer Neg Hx    Bladder Cancer Neg Hx     Social History Social History   Tobacco Use   Smoking status: Never   Smokeless tobacco: Never  Vaping Use   Vaping Use: Never used  Substance Use Topics   Alcohol use: Yes    Alcohol/week: 5.0 standard drinks of alcohol    Types: 5 Shots of liquor per week   Drug use: No     Allergies   Nsaids   Review of Systems Review of Systems  Constitutional:  Negative for fatigue and fever.  HENT:  Positive for congestion and rhinorrhea. Negative for sinus pressure, sinus pain and sore throat.   Respiratory:  Positive for cough. Negative for shortness of breath.   Gastrointestinal:  Negative for abdominal pain, diarrhea, nausea and vomiting.  Musculoskeletal:  Negative for myalgias.  Neurological:  Negative for weakness, light-headedness and headaches.  Hematological:  Negative for adenopathy.     Physical Exam Triage Vital Signs ED Triage Vitals  Enc Vitals Group     BP 02/25/22 1503 (!) 142/84     Pulse Rate 02/25/22 1503 88     Resp 02/25/22 1503 16     Temp 02/25/22 1503 98.9 F (37.2 C)     Temp Source 02/25/22 1503 Oral     SpO2 02/25/22 1503 95 %     Weight 02/25/22 1356 260 lb (117.9 kg)     Height 02/25/22 1356 5\' 10"  (1.778 m)     Head Circumference --      Peak Flow --      Pain Score 02/25/22 1354 0     Pain Loc --      Pain Edu? --      Excl. in GC? --    No data found.  Updated Vital Signs BP (!) 142/84 (BP Location: Left Arm)   Pulse 88   Temp 98.9 F (37.2 C) (Oral)   Resp 16   Ht 5\' 10"  (1.778 m)   Wt 260 lb (117.9 kg)   SpO2 95%   BMI 37.31 kg/m      Physical Exam Vitals and nursing  note reviewed.  Constitutional:      General: He is not in acute distress.    Appearance: Normal appearance. He is well-developed. He is not ill-appearing.  HENT:     Head: Normocephalic and atraumatic.     Nose: Congestion present.     Mouth/Throat:     Mouth: Mucous membranes are moist.  Pharynx: Oropharynx is clear.  Eyes:     General: No scleral icterus.    Conjunctiva/sclera: Conjunctivae normal.  Cardiovascular:     Rate and Rhythm: Normal rate and regular rhythm.     Heart sounds: Normal heart sounds.  Pulmonary:     Effort: Pulmonary effort is normal. No respiratory distress.     Breath sounds: Normal breath sounds.  Musculoskeletal:     Cervical back: Neck supple.  Skin:    General: Skin is warm and dry.     Capillary Refill: Capillary refill takes less than 2 seconds.  Neurological:     General: No focal deficit present.     Mental Status: He is alert. Mental status is at baseline.     Motor: No weakness.     Gait: Gait normal.  Psychiatric:        Mood and Affect: Mood normal.        Behavior: Behavior normal.      UC Treatments / Results  Labs (all labs ordered are listed, but only abnormal results are displayed) Labs Reviewed  SARS CORONAVIRUS 2 BY RT PCR - Abnormal; Notable for the following components:      Result Value   SARS Coronavirus 2 by RT PCR POSITIVE (*)    All other components within normal limits    EKG   Radiology No results found.  Procedures Procedures (including critical care time)  Medications Ordered in UC Medications - No data to display  Initial Impression / Assessment and Plan / UC Course  I have reviewed the triage vital signs and the nursing notes.  Pertinent labs & imaging results that were available during my care of the patient were reviewed by me and considered in my medical decision making (see chart for details).   61 year old male presents for cough and congestion x 6 days.  Felt chills and sweats at onset  but that resolved.  Has been exposed to COVID-19.  No worsening of symptoms.  He is afebrile.  He is overall well-appearing and in no acute distress.  Minimal nasal congestion.  Throat is clear.  Chest clear to auscultation heart regular rate and rhythm.  PCR COVID test obtained given his exposure.  Reviewed current CDC guidelines, incision protocol and ED precautions were positive.  Advised him I will contact him positive.  Advised if COVID test is negative he has no other viral illness.  Supportive care encouraged with cough medication as prescribed.  Advised him to return if he develops a return of suspected fever, chest pain, breathing difficulty, etc.  Work note provided.  Positive COVID test.  Call discussed result with patient.  Advised him to wear his mask for the next 4 days.  Supportive care.  Follow-up as needed.  Final Clinical Impressions(s) / UC Diagnoses   Final diagnoses:  Viral URI with cough  Close exposure to COVID-19 virus  COVID-19     Discharge Instructions      -COVID test obtained.  Call you if it is positive.  If positive you need to wear a mask for 4 more days.  If is negative you have another virus. - Supportive care encouraged.  I sent cough medicine to the pharmacy as well as nasal spray.  Increase rest and fluids. - You should be seen again if you have any return of fever, shortness of breath, etc.     ED Prescriptions     Medication Sig Dispense Auth. Provider   promethazine-dextromethorphan (PROMETHAZINE-DM) 6.25-15 MG/5ML  syrup Take 5 mLs by mouth 4 (four) times daily as needed. 118 mL Shirlee LatchEaves, Mikah Poss B, PA-C      PDMP not reviewed this encounter.   Shirlee Latchaves, Nain Rudd B, PA-C 02/25/22 1645

## 2022-02-25 NOTE — ED Triage Notes (Signed)
Chief Complaint: Cough; Nasal Congestion. Cough is productive. Having chest congestion but no SOB. Having chills at night.   Onset: Thursday night   Prescriptions or OTC medications tried: Yes- cold and flu    with no relief  Sick exposure: Yes- co-workers positive for URI   New foods, medications, or products: Yes- mucinex  Recent Travel: No

## 2022-02-25 NOTE — Discharge Instructions (Addendum)
-  COVID test obtained.  Call you if it is positive.  If positive you need to wear a mask for 4 more days.  If is negative you have another virus. - Supportive care encouraged.  I sent cough medicine to the pharmacy as well as nasal spray.  Increase rest and fluids. - You should be seen again if you have any return of fever, shortness of breath, etc.

## 2022-07-04 ENCOUNTER — Encounter: Payer: Self-pay | Admitting: Emergency Medicine

## 2022-07-04 ENCOUNTER — Ambulatory Visit
Admission: EM | Admit: 2022-07-04 | Discharge: 2022-07-04 | Disposition: A | Discharge status: Home / Self Care | Payer: BC Managed Care – PPO | Attending: Family Medicine | Admitting: Family Medicine

## 2022-07-04 DIAGNOSIS — B3741 Candidal cystitis and urethritis: Secondary | ICD-10-CM | POA: Insufficient documentation

## 2022-07-04 DIAGNOSIS — R300 Dysuria: Secondary | ICD-10-CM | POA: Diagnosis not present

## 2022-07-04 DIAGNOSIS — N3001 Acute cystitis with hematuria: Secondary | ICD-10-CM | POA: Diagnosis present

## 2022-07-04 LAB — URINALYSIS, W/ REFLEX TO CULTURE (INFECTION SUSPECTED)
Bilirubin Urine: NEGATIVE
Glucose, UA: NEGATIVE mg/dL
Ketones, ur: NEGATIVE mg/dL
Nitrite: NEGATIVE
Protein, ur: 30 mg/dL — AB
RBC / HPF: >50 RBC/hpf (ref 0–5)
Specific Gravity, Urine: 1.02 (ref 1.005–1.030)
pH: 6 (ref 5.0–8.0)

## 2022-07-04 MED ORDER — FLUCONAZOLE 150 MG PO TABS: 150.0000 mg | ORAL_TABLET | Freq: Every day | ORAL | 0 refills | Status: AC

## 2022-07-04 MED ORDER — CIPROFLOXACIN HCL 500 MG PO TABS: 500.0000 mg | ORAL_TABLET | Freq: Two times a day (BID) | ORAL | 0 refills | Status: AC

## 2022-07-04 NOTE — Discharge Instructions (Signed)
Stop by the pharmacy to pick up your prescriptions.  Follow up with your primary care provider as needed.  Call [facility] office if your still having red-brown urine.

## 2022-07-04 NOTE — ED Provider Notes (Signed)
[facility]    CSN: [id number] Arrival date & time: 07/04/22  1524      History   Chief Complaint Chief Complaint  Patient presents with   Hematuria   Urinary Frequency    HPI  HPI  Joe Dunlap is a 62 y.o. male. Here for urinary concern. Has brownish-red urine without dysuria. He urinates frequently but not more than his normal. He had has history of kidney stones but this doesn't fell like that. Follows with Dr Apolinar Junes, urology.    - Penile discharge no -Testicular pain no  - Fever: no - Abdominal pain no - Rash: no - Sore throat: no   - Arthralgias: no - Nausea: no - Vomiting: no - Dysuria: no - Back Pain: no - Headache: no       Past Medical History:  Diagnosis Date   Coronary artery disease    DDD (degenerative disc disease), lumbar    GERD (gastroesophageal reflux disease)    Headache    not intractable   History of kidney stones    Hyperlipemia    Hypertension    Impaired glucose tolerance    Kidney stones 2018   Labral tear of hip, degenerative    Lumbar radiculitis    Obesity (BMI 30-39.9)    Shingles    left T6   Sleep apnea    USES CPAP   Stomach ulcer     Patient Active Problem List   Diagnosis Date Noted   Chronic tension-type headache, not intractable 03/04/2017   Impaired glucose tolerance 01/09/2014   Shingles 10/19/2013   Right hip pain 09/20/2013   DDD (degenerative disc disease), lumbar 09/07/2013   Labral tear of hip, degenerative 09/07/2013   Lumbar radiculitis 09/07/2013   Elevated fasting blood sugar 08/03/2013   GERD (gastroesophageal reflux disease) 08/03/2013   Hyperlipidemia, unspecified 08/03/2013   Hypertension, essential, benign 08/03/2013   Low back pain 08/03/2013   Abdominal pain 07/31/2013    Past Surgical History:  Procedure Laterality Date   COLONOSCOPY WITH PROPOFOL N/A 02/11/2018   Procedure: COLONOSCOPY WITH PROPOFOL;  Surgeon: Christena Deem, MD;  Location: [facility] ENDOSCOPY;   Service: Endoscopy;  Laterality: N/A;   CYSTOSCOPY WITH INSERTION OF UROLIFT N/A 08/14/2019   Procedure: CYSTOSCOPY WITH INSERTION OF UROLIFT;  Surgeon: [name], MD;  Location: [facility] ORS;  Service: Urology;  Laterality: N/A;   CYSTOSCOPY WITH INSERTION OF UROLIFT N/A 09/08/2021   Procedure: CYSTOSCOPY WITH INSERTION OF UROLIFT;  Surgeon: [name], MD;  Location: [facility] ORS;  Service: Urology;  Laterality: N/A;   EXTRACORPOREAL SHOCK WAVE LITHOTRIPSY Right 06/25/2016   Procedure: EXTRACORPOREAL SHOCK WAVE LITHOTRIPSY (ESWL);  Surgeon: [name], MD;  Location: [facility] ORS;  Service: Urology;  Laterality: Right;   GASTRIC ULCER REPAIR     KNEE SURGERY Left    LEFT HEART CATH AND CORONARY ANGIOGRAPHY Left 05/11/2018   Procedure: LEFT HEART CATH AND CORONARY ANGIOGRAPHY;  Surgeon: Marcina Millard, MD;  Location: [facility] INVASIVE CV LAB;  Service: Cardiovascular;  Laterality: Left;   TONSILLECTOMY     UPPER GI ENDOSCOPY  2008   stomach ulcer   UVULOPALATOPHARYNGOPLASTY (UPPP)/TONSILLECTOMY/SEPTOPLASTY  2000       Home Medications    Prior to Admission medications   Medication Sig Start Date End Date Taking? Authorizing Provider  amLODipine (NORVASC) 5 MG tablet Take 5 mg by mouth at bedtime.    Yes [provider]  ciprofloxacin (CIPRO) 500 MG tablet Take 1 tablet (500 mg total)  by mouth every 12 (twelve) hours for 14 days. 07/04/22 07/18/22 Yes Culver Feighner, Seward Meth, DO  Famotidine (PEPCID AC PO) Take by mouth daily with breakfast.   Yes [provider]  fluconazole (DIFLUCAN) 150 MG tablet Take 1 tablet (150 mg total) by mouth daily for 14 doses. 07/04/22 07/18/22 Yes Rector Devonshire, DO  isosorbide mononitrate (IMDUR) 30 MG 24 hr tablet Take 30 mg by mouth daily.   Yes [provider]  metoprolol succinate (TOPROL-XL) 25 MG 24 hr tablet Take 12.5 mg by mouth daily.   Yes [provider]  pravastatin (PRAVACHOL) 40 MG tablet Take 40 mg by mouth at  bedtime.   Yes [provider]  cyclobenzaprine (FLEXERIL) 10 MG tablet Take 5-10 mg by mouth 2 (two) times daily as needed for muscle spasms.    [provider]  nitrofurantoin, macrocrystal-monohydrate, (MACROBID) 100 MG capsule Take 1 capsule (100 mg total) by mouth 2 (two) times daily. 07/07/22   Merrilee Jansky, MD  omeprazole (PRILOSEC OTC) 20 MG tablet Take 20 mg by mouth every morning.  09/26/19  [provider]    Family History Family History  Problem Relation Age of Onset   Heart attack Father    Prostatitis Neg Hx    Prostate cancer Neg Hx    Kidney cancer Neg Hx    Bladder Cancer Neg Hx     Social History Social History   Tobacco Use   Smoking status: Never   Smokeless tobacco: Never  Vaping Use   Vaping Use: Never used  Substance Use Topics   Alcohol use: Yes    Alcohol/week: 5.0 standard drinks of alcohol    Types: 5 Shots of liquor per week   Drug use: No     Allergies   Nsaids   Review of Systems Review of Systems: negative unless otherwise stated in HPI.      Physical Exam Triage Vital Signs ED Triage Vitals  Enc Vitals Group     BP 07/04/22 1540 (!) 143/79     Pulse Rate 07/04/22 1540 81     Resp 07/04/22 1540 15     Temp 07/04/22 1540 98.5 F (36.9 C)     Temp Source 07/04/22 1540 Oral     SpO2 07/04/22 1540 95 %     Weight 07/04/22 1538 255 lb (115.7 kg)     Height 07/04/22 1538 5\' 10"  (1.778 m)     Head Circumference --      Peak Flow --      Pain Score 07/04/22 1538 0     Pain Loc --      Pain Edu? --      Excl. in GC? --    No data found.  Updated Vital Signs BP (!) 143/79 (BP Location: Left Arm)   Pulse 81   Temp 98.5 F (36.9 C) (Oral)   Resp 15   Ht 5\' 10"  (1.778 m)   Wt 115.7 kg   SpO2 95%   BMI 36.59 kg/m   Visual Acuity Right Eye Distance:   Left Eye Distance:   Bilateral Distance:    Right Eye Near:   Left Eye Near:    Bilateral Near:     Physical Exam GEN: well appearing male  in no acute distress  CVS: well perfused  RESP: speaking in full sentences without pause, no respiratory distress    UC Treatments / Results  Labs (all labs ordered are listed, but only abnormal results are displayed)  Labs Reviewed  URINE CULTURE - Abnormal; Notable for the following components:      Result Value   Culture >=100,000 COLONIES/mL ENTEROCOCCUS FAECALIS (*)    Organism ID, Bacteria ENTEROCOCCUS FAECALIS (*)    All other components within normal limits  URINALYSIS, W/ REFLEX TO CULTURE (INFECTION SUSPECTED) - Abnormal; Notable for the following components:   APPearance HAZY (*)    Hgb urine dipstick LARGE (*)    Protein, ur 30 (*)    Leukocytes,Ua MODERATE (*)    Bacteria, UA MANY (*)    All other components within normal limits    EKG   Radiology No results found.  Procedures Procedures (including critical care time)  Medications Ordered in UC Medications - No data to display  Initial Impression / Assessment and Plan / UC Course  I have reviewed the triage vital signs and the nursing notes.  Pertinent labs & imaging results that were available during my care of the patient were reviewed by me and considered in my medical decision making (see chart for details).     Patient is a 62 y.o. male  who presents for acute onset red-brown urine.  He follows with urology. Overall patient is well-appearing and afebrile.  Vital signs stable.  UA consistent with acute cystitis.  Hematuria supported on microscopy.  Treat with Cipro as his previous urine culture August 27, 2021 showed sensitivity to Cipro.  Urine culture obtained today.  Follow-up sensitivities and change antibiotics, if needed.  Patient is advised to follow-up with his urologist Dr. Apolinar Junes.  Return precautions including abdominal pain, fever, chills, nausea, or vomiting given.  There is also yeast seen in his urinalysis.  Patient given Diflucan for 14 days for candidal cystitis.  Discussed MDM, treatment  plan and plan for follow-up with patient who agrees with plan.     Final Clinical Impressions(s) / UC Diagnoses   Final diagnoses:  Dysuria  Acute cystitis with hematuria  Yeast cystitis     Discharge Instructions      Stop by the pharmacy to pick up your prescriptions.  Follow up with your primary care provider as needed.  Call Day Dr Delana Meyer office if your still having red-brown urine.      ED Prescriptions     Medication Sig Dispense Auth. Provider   ciprofloxacin (CIPRO) 500 MG tablet Take 1 tablet (500 mg total) by mouth every 12 (twelve) hours for 14 days. 28 tablet Africa Masaki, DO   fluconazole (DIFLUCAN) 150 MG tablet Take 1 tablet (150 mg total) by mouth daily for 14 doses. 14 tablet Daxon Kyne, Seward Meth, DO      PDMP not reviewed this encounter.   Katha Cabal, DO 07/10/22 1043

## 2022-07-04 NOTE — ED Triage Notes (Signed)
Patient reports dark colored urine and possible blood in his urine for the past 2 days.  Patient also reports urinary frequency.  Patient denies any pain.

## 2022-07-07 ENCOUNTER — Telehealth (HOSPITAL_COMMUNITY): Payer: Self-pay

## 2022-07-07 LAB — URINE CULTURE: Culture: >=100000 — AB

## 2022-07-07 MED ORDER — NITROFURANTOIN MONOHYD MACRO 100 MG PO CAPS: 100.0000 mg | ORAL_CAPSULE | Freq: Two times a day (BID) | ORAL | 0 refills | Status: DC

## 2022-07-17 NOTE — H&P (View-Only) (Signed)
07/20/22 10:13 AM   [name] 16-Mar-1960 [id number]  Referring provider:  Marina Goodell, MD [address] [place],  [place] [address]   Urological history  BPH with LUTS  -Urolift (2021) -Cystoscopy (08/08/21) - hypervascularity- loss of anterior channel .   2. Bilateral kidney sones  -stone composition of 70% calcium oxalate monohydrate, 27% calcium oxalate dihydrate and 3% calcium phosphate  -re-2018 s/p ESWL x 1 in remote past.  S/p sucessful right ESWL for a 6 mm proximal ureteral calculus on 2/18 -CT renal stone study (07/22/2021) -large stone within the inferior pole of the left kidney measuring 11 mm. Multiple 2-5 mm stones throughout the right kidney. Urinary bladder is unremarkable.   4. Erectile dysfunction -contributing factors of age, HTN, CAD, sleep apnea, lumbar DDD and HLD -sildenafil 20 mg, on-demand-dosing  Chief Complaint  Patient presents with   Nephrolithiasis   Benign Prostatic Hypertrophy    HPI: Joe Dunlap is a 62 y.o.male who presents today for UTI no clearing up.    He was last seen in our office on October 10, 2021 with Dr. Apolinar Junes for follow-up after undergoing UroLift and he was doing well at that time.  He was scheduled to return back in August 2024 for IPSS, PVR, PSA and DRE.  He went to [facility] urgent care on Jul 04, 2022 complaining of dark-colored urine and possible blood in his urine for the past 2 days associated with urinary frequency.  UA was yellow hazy, specific gravity 1.020, pH 6.0, large hemoglobin, 30 protein, moderate leukocyte, 0-5 squamous epithelial cells, 21-50 WBCs, greater than 50 RBCs, many bacteria and budding yeast present.  He discharge on Cipro and Diflucan.  His urine culture was positive for Enterococcus faecalis.  He was instructed to stop Cipro and start Macrobid.    He then contacted the office on Jul 14, 2022 stating that his urinary tract infection symptoms had not abated.  KUB 15 mm radiopaque  calcification is noted in the vicinity of the left UPJ and it almost appears that the ureter is dilated below it.  A 4 mm calcification is also noted in the right lower pole.  He states he has been having intermittent left-sided flank pain with frequency, urgency, gross hematuria and urge incontinence.  He did mention that he feels like the UroLift has worn off as his lower urinary symptoms returned 2 weeks after his appointment with Dr. Apolinar Junes in August.  Patient denies any modifying or aggravating factors.  Patient denies any suprapubic.  Patient denies any fevers, chills, nausea or vomiting.    UA yellow cloudy, specific gravity 1.020, pH 6.5, moderate hemoglobin, 100 protein, moderate leukocytes, 0-5 squamous epithelial cells, greater than 50 WBCs, greater than 50 RBCs, many bacteria, WBC clumps present and calcium oxalate crystals present  PVR 74 mL    PMH: Past Medical History:  Diagnosis Date   Coronary artery disease    DDD (degenerative disc disease), lumbar    GERD (gastroesophageal reflux disease)    Headache    not intractable   History of kidney stones    Hyperlipemia    Hypertension    Impaired glucose tolerance    Kidney stones 2018   Labral tear of hip, degenerative    Lumbar radiculitis    Obesity (BMI 30-39.9)    Shingles    left T6   Sleep apnea    USES CPAP   Stomach ulcer     Surgical History: Past  Surgical History:  Procedure Laterality Date   COLONOSCOPY WITH PROPOFOL N/A 02/11/2018   Procedure: COLONOSCOPY WITH PROPOFOL;  Surgeon: Christena Deem, MD;  Location: [facility] ENDOSCOPY;  Service: Endoscopy;  Laterality: N/A;   CYSTOSCOPY WITH INSERTION OF UROLIFT N/A 08/14/2019   Procedure: CYSTOSCOPY WITH INSERTION OF UROLIFT;  Surgeon: [name], MD;  Location: [facility] ORS;  Service: Urology;  Laterality: N/A;   CYSTOSCOPY WITH INSERTION OF UROLIFT N/A 09/08/2021   Procedure: CYSTOSCOPY WITH INSERTION OF UROLIFT;  Surgeon: [name], MD;   Location: [facility] ORS;  Service: Urology;  Laterality: N/A;   EXTRACORPOREAL SHOCK WAVE LITHOTRIPSY Right 06/25/2016   Procedure: EXTRACORPOREAL SHOCK WAVE LITHOTRIPSY (ESWL);  Surgeon: [name], MD;  Location: [facility] ORS;  Service: Urology;  Laterality: Right;   GASTRIC ULCER REPAIR     KNEE SURGERY Left    LEFT HEART CATH AND CORONARY ANGIOGRAPHY Left 05/11/2018   Procedure: LEFT HEART CATH AND CORONARY ANGIOGRAPHY;  Surgeon: Marcina Millard, MD;  Location: [facility] INVASIVE CV LAB;  Service: Cardiovascular;  Laterality: Left;   TONSILLECTOMY     UPPER GI ENDOSCOPY  2008   stomach ulcer   UVULOPALATOPHARYNGOPLASTY (UPPP)/TONSILLECTOMY/SEPTOPLASTY  2000    Home Medications:  Allergies as of 07/20/2022       Reactions   Nsaids Other (See Comments)   Hx of ulcer        Medication List        Accurate as of Jul 20, 2022 10:13 AM. If you have any questions, ask your nurse or doctor.          STOP taking these medications    nitrofurantoin (macrocrystal-monohydrate) 100 MG capsule Commonly known as: MACROBID Stopped by: Thadius Smisek, PA-C   PEPCID AC PO Stopped by: Michiel Cowboy, PA-C       TAKE these medications    amLODipine 5 MG tablet Commonly known as: NORVASC Take 5 mg by mouth at bedtime.   cyclobenzaprine 10 MG tablet Commonly known as: FLEXERIL Take 5-10 mg by mouth 2 (two) times daily as needed for muscle spasms.   isosorbide mononitrate 30 MG 24 hr tablet Commonly known as: IMDUR Take 30 mg by mouth daily.   metoprolol succinate 25 MG 24 hr tablet Commonly known as: TOPROL-XL Take 12.5 mg by mouth daily.   pravastatin 40 MG tablet Commonly known as: PRAVACHOL Take 40 mg by mouth at bedtime.        Allergies:  Allergies  Allergen Reactions   Nsaids Other (See Comments)    Hx of ulcer    Family History: Family History  Problem Relation Age of Onset   Heart attack Father    Prostatitis Neg Hx    Prostate cancer Neg Hx     Kidney cancer Neg Hx    Bladder Cancer Neg Hx     Social History:  reports that he has never smoked. He has never used smokeless tobacco. He reports current alcohol use of about 5.0 standard drinks of alcohol per week. He reports that he does not use drugs.   Physical Exam: BP 129/75 (BP Location: Left Arm, Patient Position: Sitting, Cuff Size: Large)   Pulse 77   Ht 5\' 10"  (1.778 m)   Wt 252 lb 9.6 oz (114.6 kg)   BMI 36.24 kg/m   Constitutional:  Well nourished. Alert and oriented, No acute distress. HEENT: [place] AT, moist mucus membranes.  Trachea midline Cardiovascular: No clubbing, cyanosis, or edema. Respiratory: Normal respiratory effort, no increased work of breathing. Neurologic:  Grossly intact, no focal deficits, moving all 4 extremities. Psychiatric: Normal mood and affect.   Laboratory Data: Serum creatinine on March 03, 2022 was 0.8 with a EGFR 101 Total cholesterol 157 on March 03, 2022 Hemoglobin A1c on March 03, 2022 6.1 Urinalysis See EPIC and HPI I have reviewed the labs.   Pertinent Imaging:  07/20/22 09:01  Scan Result 74ml   KUB 15 mm radiopaque calcification is noted in the vicinity of the left UPJ and it almost appears that the ureter is dilated below it.  A 4 mm calcification is also noted in the right lower pole. I have independently reviewed the films.  See HPI.  Radiologist interpretation still pending.  Assessment & Plan:    Urinary tract infection  - UA with pyuria, hematuria and bacteriuria - urine sent for culture - will hold on prescribing antibiotic until culture and sensitivities have returned  2. Microscopic hematuria  - UA with greater than 50 RBCs likely due to nephrolithiasis -Will continue to monitor  3. BPH with urinary obstruction  -We will table further workup regarding his BPH after we have obtained CT scan and have successfully treated his left-sided stone  4. Nephrolithiasis -Bilateral nephrolithiasis, with a  calcification suspicious for a left UPJ stone and possible distal left ureteral stone -CT renal stone study to be formed on Wednesday for further evaluation -Reviewed return precautions  Return for CT Wednesday .  Michiel Cowboy, PA-C   [facility])  [address], [address] [place], [place] [address] [phone]

## 2022-07-17 NOTE — Progress Notes (Signed)
07/20/22 10:13 AM   Joe Dunlap [place] 16-Mar-1960 [id number]  Referring provider:  Marina Goodell, MD [address] [place],  [place] [address]   Urological history  BPH with LUTS  -Urolift (2021) -Cystoscopy (08/08/21) - hypervascularity- loss of anterior channel .   2. Bilateral kidney sones  -stone composition of 70% calcium oxalate monohydrate, 27% calcium oxalate dihydrate and 3% calcium phosphate  -re-2018 s/p ESWL x 1 in remote past.  S/p sucessful right ESWL for a 6 mm proximal ureteral calculus on 2/18 -CT renal stone study (07/22/2021) -large stone within the inferior pole of the left kidney measuring 11 mm. Multiple 2-5 mm stones throughout the right kidney. Urinary bladder is unremarkable.   4. Erectile dysfunction -contributing factors of age, HTN, CAD, sleep apnea, lumbar DDD and HLD -sildenafil 20 mg, on-demand-dosing  Chief Complaint  Patient presents with   Nephrolithiasis   Benign Prostatic Hypertrophy    HPI: Joe Dunlap is a 62 y.o.male who presents today for UTI no clearing up.    He was last seen in our office on October 10, 2021 with Joe Dunlap for follow-up after undergoing UroLift and he was doing well at that time.  He was scheduled to return back in August 2024 for IPSS, PVR, PSA and DRE.  He went to [facility] urgent care on Jul 04, 2022 complaining of dark-colored urine and possible blood in his urine for the past 2 days associated with urinary frequency.  UA was yellow hazy, specific gravity 1.020, pH 6.0, large hemoglobin, 30 protein, moderate leukocyte, 0-5 squamous epithelial cells, 21-50 WBCs, greater than 50 RBCs, many bacteria and budding yeast present.  He discharge on Cipro and Diflucan.  His urine culture was positive for Enterococcus faecalis.  He was instructed to stop Cipro and start Macrobid.    He then contacted the office on Jul 14, 2022 stating that his urinary tract infection symptoms had not abated.  KUB 15 mm radiopaque  calcification is noted in the vicinity of the left UPJ and it almost appears that the ureter is dilated below it.  A 4 mm calcification is also noted in the right lower pole.  He states he has been having intermittent left-sided flank pain with frequency, urgency, gross hematuria and urge incontinence.  He did mention that he feels like the UroLift has worn off as his lower urinary symptoms returned 2 weeks after his appointment with Joe Dunlap in August.  Patient denies any modifying or aggravating factors.  Patient denies any suprapubic.  Patient denies any fevers, chills, nausea or vomiting.    UA yellow cloudy, specific gravity 1.020, pH 6.5, moderate hemoglobin, 100 protein, moderate leukocytes, 0-5 squamous epithelial cells, greater than 50 WBCs, greater than 50 RBCs, many bacteria, WBC clumps present and calcium oxalate crystals present  PVR 74 mL    PMH: Past Medical History:  Diagnosis Date   Coronary artery disease    DDD (degenerative disc disease), lumbar    GERD (gastroesophageal reflux disease)    Headache    not intractable   History of kidney stones    Hyperlipemia    Hypertension    Impaired glucose tolerance    Kidney stones 2018   Labral tear of hip, degenerative    Lumbar radiculitis    Obesity (BMI 30-39.9)    Shingles    left T6   Sleep apnea    USES CPAP   Stomach ulcer     Surgical History: Past  Surgical History:  Procedure Laterality Date   COLONOSCOPY WITH PROPOFOL N/A 02/11/2018   Procedure: COLONOSCOPY WITH PROPOFOL;  Surgeon: Joe Deem, MD;  Location: [facility] ENDOSCOPY;  Service: Endoscopy;  Laterality: N/A;   CYSTOSCOPY WITH INSERTION OF UROLIFT N/A 08/14/2019   Procedure: CYSTOSCOPY WITH INSERTION OF UROLIFT;  Surgeon: Joe Scotland, MD;  Location: [facility] ORS;  Service: Urology;  Laterality: N/A;   CYSTOSCOPY WITH INSERTION OF UROLIFT N/A 09/08/2021   Procedure: CYSTOSCOPY WITH INSERTION OF UROLIFT;  Surgeon: Joe Scotland, MD;   Location: [facility] ORS;  Service: Urology;  Laterality: N/A;   EXTRACORPOREAL SHOCK WAVE LITHOTRIPSY Right 06/25/2016   Procedure: EXTRACORPOREAL SHOCK WAVE LITHOTRIPSY (ESWL);  Surgeon: Joe Scotland, MD;  Location: [facility] ORS;  Service: Urology;  Laterality: Right;   GASTRIC ULCER REPAIR     KNEE SURGERY Left    LEFT HEART CATH AND CORONARY ANGIOGRAPHY Left 05/11/2018   Procedure: LEFT HEART CATH AND CORONARY ANGIOGRAPHY;  Surgeon: Joe Millard, MD;  Location: [facility] INVASIVE CV LAB;  Service: Cardiovascular;  Laterality: Left;   TONSILLECTOMY     UPPER GI ENDOSCOPY  2008   stomach ulcer   UVULOPALATOPHARYNGOPLASTY (UPPP)/TONSILLECTOMY/SEPTOPLASTY  2000    Home Medications:  Allergies as of 07/20/2022       Reactions   Nsaids Other (See Comments)   Hx of ulcer        Medication List        Accurate as of Jul 20, 2022 10:13 AM. If you have any questions, ask your nurse or doctor.          STOP taking these medications    nitrofurantoin (macrocrystal-monohydrate) 100 MG capsule Commonly known as: MACROBID Stopped by: Joe Hanners, PA-C   PEPCID AC PO Stopped by: Joe Cowboy, PA-C       TAKE these medications    amLODipine 5 MG tablet Commonly known as: NORVASC Take 5 mg by mouth at bedtime.   cyclobenzaprine 10 MG tablet Commonly known as: FLEXERIL Take 5-10 mg by mouth 2 (two) times daily as needed for muscle spasms.   isosorbide mononitrate 30 MG 24 hr tablet Commonly known as: IMDUR Take 30 mg by mouth daily.   metoprolol succinate 25 MG 24 hr tablet Commonly known as: TOPROL-XL Take 12.5 mg by mouth daily.   pravastatin 40 MG tablet Commonly known as: PRAVACHOL Take 40 mg by mouth at bedtime.        Allergies:  Allergies  Allergen Reactions   Nsaids Other (See Comments)    Hx of ulcer    Family History: Family History  Problem Relation Age of Onset   Heart attack Father    Prostatitis Neg Hx    Prostate cancer Neg Hx     Kidney cancer Neg Hx    Bladder Cancer Neg Hx     Social History:  reports that he has never smoked. He has never used smokeless tobacco. He reports current alcohol use of about 5.0 standard drinks of alcohol per week. He reports that he does not use drugs.   Physical Exam: BP 129/75 (BP Location: Left Arm, Patient Position: Sitting, Cuff Size: Large)   Pulse 77   Ht 5\' 10"  (1.778 m)   Wt 252 lb 9.6 oz (114.6 kg)   BMI 36.24 kg/m   Constitutional:  Well nourished. Alert and oriented, No acute distress. HEENT: [place] AT, moist mucus membranes.  Trachea midline Cardiovascular: No clubbing, cyanosis, or edema. Respiratory: Normal respiratory effort, no increased work of breathing. Neurologic:  Grossly intact, no focal deficits, moving all 4 extremities. Psychiatric: Normal mood and affect.   Laboratory Data: Serum creatinine on March 03, 2022 was 0.8 with a EGFR 101 Total cholesterol 157 on March 03, 2022 Hemoglobin A1c on March 03, 2022 6.1 Urinalysis See EPIC and HPI I have reviewed the labs.   Pertinent Imaging:  07/20/22 09:01  Scan Result 74ml   KUB 15 mm radiopaque calcification is noted in the vicinity of the left UPJ and it almost appears that the ureter is dilated below it.  A 4 mm calcification is also noted in the right lower pole. I have independently reviewed the films.  See HPI.  Radiologist interpretation still pending.  Assessment & Plan:    Urinary tract infection  - UA with pyuria, hematuria and bacteriuria - urine sent for culture - will hold on prescribing antibiotic until culture and sensitivities have returned  2. Microscopic hematuria  - UA with greater than 50 RBCs likely due to nephrolithiasis -Will continue to monitor  3. BPH with urinary obstruction  -We will table further workup regarding his BPH after we have obtained CT scan and have successfully treated his left-sided stone  4. Nephrolithiasis -Bilateral nephrolithiasis, with a  calcification suspicious for a left UPJ stone and possible distal left ureteral stone -CT renal stone study to be formed on Wednesday for further evaluation -Reviewed return precautions  Return for CT Wednesday .  Joe Cowboy, PA-C   [facility])  [address], [address] [place], [place] [address] [phone]

## 2022-07-20 ENCOUNTER — Ambulatory Visit
Admission: RE | Admit: 2022-07-20 | Discharge: 2022-07-20 | Disposition: A | Discharge status: Home / Self Care | Payer: BC Managed Care – PPO | Source: Ambulatory Visit | Attending: Urology | Admitting: Urology

## 2022-07-20 ENCOUNTER — Other Ambulatory Visit
Admission: RE | Admit: 2022-07-20 | Discharge: 2022-07-20 | Disposition: A | Discharge status: Home / Self Care | Payer: BC Managed Care – PPO | Source: Home / Self Care | Attending: Urology | Admitting: Urology

## 2022-07-20 ENCOUNTER — Other Ambulatory Visit: Payer: Self-pay

## 2022-07-20 ENCOUNTER — Ambulatory Visit: Payer: BC Managed Care – PPO | Admitting: Urology

## 2022-07-20 ENCOUNTER — Encounter: Payer: Self-pay | Admitting: Urology

## 2022-07-20 ENCOUNTER — Ambulatory Visit
Admission: RE | Admit: 2022-07-20 | Discharge: 2022-07-20 | Disposition: A | Discharge status: Home / Self Care | Payer: BC Managed Care – PPO | Attending: Urology | Admitting: Urology

## 2022-07-20 VITALS — BP 129/75 | HR 77 | Ht 70.0 in | Wt 252.6 lb

## 2022-07-20 DIAGNOSIS — N200 Calculus of kidney: Secondary | ICD-10-CM

## 2022-07-20 DIAGNOSIS — N401 Enlarged prostate with lower urinary tract symptoms: Secondary | ICD-10-CM | POA: Diagnosis not present

## 2022-07-20 DIAGNOSIS — R3129 Other microscopic hematuria: Secondary | ICD-10-CM

## 2022-07-20 DIAGNOSIS — R3989 Other symptoms and signs involving the genitourinary system: Secondary | ICD-10-CM

## 2022-07-20 DIAGNOSIS — N390 Urinary tract infection, site not specified: Secondary | ICD-10-CM

## 2022-07-20 DIAGNOSIS — N138 Other obstructive and reflux uropathy: Secondary | ICD-10-CM

## 2022-07-20 LAB — URINALYSIS, COMPLETE (UACMP) WITH MICROSCOPIC
Bilirubin Urine: NEGATIVE
Glucose, UA: NEGATIVE mg/dL
Ketones, ur: NEGATIVE mg/dL
Nitrite: NEGATIVE
Protein, ur: 100 mg/dL — AB
RBC / HPF: >50 RBC/hpf (ref 0–5)
Specific Gravity, Urine: 1.02 (ref 1.005–1.030)
WBC, UA: >50 WBC/hpf (ref 0–5)
pH: 6.5 (ref 5.0–8.0)

## 2022-07-20 LAB — BLADDER SCAN AMB NON-IMAGING

## 2022-07-22 ENCOUNTER — Other Ambulatory Visit: Payer: Self-pay | Admitting: Urology

## 2022-07-22 ENCOUNTER — Ambulatory Visit
Admission: RE | Admit: 2022-07-22 | Discharge: 2022-07-22 | Disposition: A | Discharge status: Home / Self Care | Payer: BC Managed Care – PPO | Source: Ambulatory Visit | Attending: Urology | Admitting: Urology

## 2022-07-22 DIAGNOSIS — N200 Calculus of kidney: Secondary | ICD-10-CM | POA: Insufficient documentation

## 2022-07-22 DIAGNOSIS — R3989 Other symptoms and signs involving the genitourinary system: Secondary | ICD-10-CM

## 2022-07-22 DIAGNOSIS — R3129 Other microscopic hematuria: Secondary | ICD-10-CM | POA: Diagnosis present

## 2022-07-22 LAB — URINE CULTURE: Culture: >=100000 — AB

## 2022-07-22 MED ORDER — AMPICILLIN 500 MG PO CAPS: 500.0000 mg | ORAL_CAPSULE | Freq: Four times a day (QID) | ORAL | 0 refills | Status: DC

## 2022-07-28 ENCOUNTER — Other Ambulatory Visit: Payer: Self-pay | Admitting: Urology

## 2022-07-28 DIAGNOSIS — N201 Calculus of ureter: Secondary | ICD-10-CM

## 2022-07-28 NOTE — Progress Notes (Unsigned)
  Phone Number: [phone] for Surgical Coordinator Fax Number: [phone]  REQUEST FOR SURGICAL CLEARANCE       Date: Date: 07/28/22  Faxed to: Dr. Juliann Pares  Surgeon: Dr. Legrand Rams, MD     Date of Surgery: TBD  Operation: Left Extracorporeal ShockWave Lithotripsy  Anesthesia Type: MAC   Diagnosis: Left Ureteral Stone   Patient Requires:   Cardiac / Vascular Clearance : Yes  Reason: Due to recent cardiac testing would like to know if patient is stable enough for procedure.     Risk Assessment:    Low   []       Moderate   []     High   []           This patient is optimized for surgery  YES []       NO   []    I recommend further assessment/workup prior to surgery. YES []      NO  []   Appointment scheduled for: _______________________   Further recommendations: ____________________________________     Physician Signature:__________________________________   Printed Name: ________________________________________   Date: _________________

## 2022-07-28 NOTE — Progress Notes (Signed)
Surgical Clearance sent over to [facility]. Once received will set up patient to have lithotripsy.

## 2022-07-28 NOTE — Progress Notes (Signed)
ESWL ORDER FORM  Expected date of procedure: 07/30/2022  Surgeon:  TBD  Post op standing: 2-4wk follow up w/KUB prior  Anticoagulation/Aspirin/NSAID standing order: Hold all 72 hours prior  Anesthesia standing order: MAC  VTE standing: SCD's  Dx: Left Ureteral Stone  Procedure: left Extracorporeal shock wave lithotripsy  CPT : 50590  Standing Order Set:   *NPO after mn, KUB  *NS 143ml/hr, Keflex 500mg  PO, Benadryl 25mg  PO, Valium 10mg  PO, Zofran 4mg  IV    Medications if other than standing orders:    He had a heart cath on 05/ 23/2024, so we will need cardiac clearance on him

## 2022-08-13 ENCOUNTER — Ambulatory Visit
Admission: RE | Admit: 2022-08-13 | Discharge: 2022-08-13 | Disposition: A | Discharge status: Home / Self Care | Payer: BC Managed Care – PPO | Source: Ambulatory Visit | Attending: Urology | Admitting: Urology

## 2022-08-13 ENCOUNTER — Ambulatory Visit: Payer: BC Managed Care – PPO

## 2022-08-13 ENCOUNTER — Other Ambulatory Visit: Payer: Self-pay

## 2022-08-13 ENCOUNTER — Encounter
Admission: RE | Disposition: A | Discharge status: Home / Self Care | Payer: Self-pay | Source: Ambulatory Visit | Attending: Urology

## 2022-08-13 ENCOUNTER — Encounter: Payer: Self-pay | Admitting: Urology

## 2022-08-13 DIAGNOSIS — N201 Calculus of ureter: Secondary | ICD-10-CM

## 2022-08-13 DIAGNOSIS — Z87442 Personal history of urinary calculi: Secondary | ICD-10-CM | POA: Insufficient documentation

## 2022-08-13 DIAGNOSIS — G4730 Sleep apnea, unspecified: Secondary | ICD-10-CM | POA: Diagnosis not present

## 2022-08-13 DIAGNOSIS — N529 Male erectile dysfunction, unspecified: Secondary | ICD-10-CM | POA: Diagnosis not present

## 2022-08-13 DIAGNOSIS — N390 Urinary tract infection, site not specified: Secondary | ICD-10-CM | POA: Diagnosis not present

## 2022-08-13 DIAGNOSIS — N401 Enlarged prostate with lower urinary tract symptoms: Secondary | ICD-10-CM | POA: Insufficient documentation

## 2022-08-13 DIAGNOSIS — N200 Calculus of kidney: Secondary | ICD-10-CM

## 2022-08-13 DIAGNOSIS — I10 Essential (primary) hypertension: Secondary | ICD-10-CM | POA: Insufficient documentation

## 2022-08-13 DIAGNOSIS — E669 Obesity, unspecified: Secondary | ICD-10-CM | POA: Insufficient documentation

## 2022-08-13 DIAGNOSIS — Z6836 Body mass index (BMI) 36.0-36.9, adult: Secondary | ICD-10-CM | POA: Insufficient documentation

## 2022-08-13 DIAGNOSIS — I2510 Atherosclerotic heart disease of native coronary artery without angina pectoris: Secondary | ICD-10-CM | POA: Insufficient documentation

## 2022-08-13 DIAGNOSIS — E785 Hyperlipidemia, unspecified: Secondary | ICD-10-CM | POA: Insufficient documentation

## 2022-08-13 DIAGNOSIS — K219 Gastro-esophageal reflux disease without esophagitis: Secondary | ICD-10-CM | POA: Diagnosis not present

## 2022-08-13 HISTORY — PX: EXTRACORPOREAL SHOCK WAVE LITHOTRIPSY: SHX1557

## 2022-08-13 SURGERY — LITHOTRIPSY, ESWL
Anesthesia: Moderate Sedation | Laterality: Right

## 2022-08-13 MED ORDER — SODIUM CHLORIDE 0.9 % IV SOLN: INTRAVENOUS | Status: DC

## 2022-08-13 MED ORDER — DIPHENHYDRAMINE HCL 25 MG PO CAPS
25.0000 mg | ORAL_CAPSULE | ORAL | Status: AC
  Administered 2022-08-13: 25 mg via ORAL

## 2022-08-13 MED ORDER — DIAZEPAM 5 MG PO TABS
ORAL_TABLET | ORAL | Status: AC
  Filled 2022-08-13: qty 2

## 2022-08-13 MED ORDER — CEPHALEXIN 500 MG PO CAPS
ORAL_CAPSULE | ORAL | Status: AC
  Filled 2022-08-13: qty 1

## 2022-08-13 MED ORDER — ONDANSETRON HCL 4 MG/2ML IJ SOLN
INTRAMUSCULAR | Status: AC
  Filled 2022-08-13: qty 2

## 2022-08-13 MED ORDER — OXYCODONE-ACETAMINOPHEN 5-325 MG PO TABS: 1.0000 | ORAL_TABLET | Freq: Four times a day (QID) | ORAL | 0 refills | Status: AC | PRN

## 2022-08-13 MED ORDER — DIAZEPAM 5 MG PO TABS
10.0000 mg | ORAL_TABLET | ORAL | Status: AC
  Administered 2022-08-13: 10 mg via ORAL

## 2022-08-13 MED ORDER — DIPHENHYDRAMINE HCL 25 MG PO CAPS
ORAL_CAPSULE | ORAL | Status: AC
  Filled 2022-08-13: qty 1

## 2022-08-13 MED ORDER — TAMSULOSIN HCL 0.4 MG PO CAPS: 0.4000 mg | ORAL_CAPSULE | Freq: Every day | ORAL | 0 refills | Status: DC

## 2022-08-13 MED ORDER — CEPHALEXIN 500 MG PO CAPS
500.0000 mg | ORAL_CAPSULE | Freq: Once | ORAL | Status: AC
  Administered 2022-08-13: 500 mg via ORAL

## 2022-08-13 MED ORDER — ONDANSETRON HCL 4 MG/2ML IJ SOLN
4.0000 mg | Freq: Once | INTRAMUSCULAR | Status: AC
  Administered 2022-08-13: 4 mg via INTRAVENOUS

## 2022-08-13 NOTE — Discharge Instructions (Signed)
AMBULATORY SURGERY  DISCHARGE INSTRUCTIONS   The drugs that you were given will stay in your system until tomorrow so for the next 24 hours you should not:  Drive an automobile Make any legal decisions Drink any alcoholic beverage   You may resume regular meals tomorrow.  Today it is better to start with liquids and gradually work up to solid foods.  You may eat anything you prefer, but it is better to start with liquids, then soup and crackers, and gradually work up to solid foods.   Please notify your doctor immediately if you have any unusual bleeding, trouble breathing, redness and pain at the surgery site, drainage, fever, or pain not relieved by medication.       Please contact your physician with any problems or Same Day Surgery at [phone], Monday through Friday 6 am to 4 pm, or [facility] [place] Main number at [phone].  

## 2022-08-13 NOTE — Interval H&P Note (Signed)
UROLOGY H&P UPDATE  Agree with prior H&P dated 07/20/22 by Michiel Cowboy, PA. 1.4cm left renal pelvis stone, pre-op cx with enterococcus treated with abx.  Cardiac: RRR Lungs: CTA bilaterally  Laterality: LEFT Procedure: Shockwave lithotripsy  Urine: culture 5/20 enterococcus, treated 5/20  Informed consent obtained, we specifically discussed the risks of bleeding/hematoma, infection/sepsis, post-operative pain/obstructive fragments, need for additional procedures.  Sondra Come, MD 08/13/2022

## 2022-08-13 NOTE — Brief Op Note (Signed)
08/13/2022  8:30 AM  PATIENT:  Joe Dunlap   PRE-OPERATIVE DIAGNOSIS:  1.4cm left renal stone  POST-OPERATIVE DIAGNOSIS:  Same  PROCEDURE: Left shockwave lithotripsy  SURGEON:  Surgeon(s) and Role:    Sondra Come, MD - Primary  ANESTHESIA: Conscious Sedation  EBL:  None  Drains: None  Specimen: None  Findings:  1.Stone appeared to smudge, tolerated SWL well  DISPO: Flomax, pain meds PRN, RTC 2 weeks KUB  Legrand Rams, MD 08/13/2022

## 2022-08-17 ENCOUNTER — Encounter: Payer: Self-pay | Admitting: Urology

## 2022-08-24 ENCOUNTER — Encounter: Payer: Self-pay | Admitting: Urology

## 2022-08-24 ENCOUNTER — Other Ambulatory Visit: Payer: Self-pay | Admitting: Urology

## 2022-08-31 ENCOUNTER — Ambulatory Visit: Payer: BC Managed Care – PPO | Admitting: Urology

## 2022-09-04 ENCOUNTER — Other Ambulatory Visit: Payer: Self-pay | Admitting: Urology

## 2022-10-12 ENCOUNTER — Ambulatory Visit: Payer: BC Managed Care – PPO | Admitting: Urology

## 2022-11-29 ENCOUNTER — Other Ambulatory Visit: Payer: Self-pay | Admitting: Urology

## 2022-11-29 DIAGNOSIS — R350 Frequency of micturition: Secondary | ICD-10-CM

## 2022-11-29 DIAGNOSIS — N200 Calculus of kidney: Secondary | ICD-10-CM

## 2022-11-29 NOTE — Progress Notes (Unsigned)
11/30/22 11:51 AM   Joe Dunlap Jun 23, 1960 [id number]  Referring provider:  Marina Goodell, MD [address] [place],  [place] [address]  Urological history  BPH with LUTS  -Urolift (2021) -Cystoscopy (08/08/21) - hypervascularity- loss of anterior Dunlap   2. Bilateral kidney sones  -stone composition of 70% calcium oxalate monohydrate, 27% calcium oxalate dihydrate and 3% calcium phosphate  -2018 s/p ESWL x 1 in remote past.  S/p sucessful right ESWL for a 6 mm proximal ureteral calculus on 2/18 -CT renal stone study (07/22/2021) -large stone within the inferior pole of the left kidney measuring 11 mm. Multiple 2-5 mm stones throughout the right kidney. Urinary bladder is unremarkable.  -CT renal stone study (07/2022) - 1.4 cm calculus within the left renal pelvis with associated mild dilatation and wall thickening of the left renal pelvis and mild surrounding inflammatory change -left ESWL   4. Erectile dysfunction -contributing factors of age, HTN, CAD, sleep apnea, lumbar DDD, COVID, BPH and HLD -sildenafil 20 mg, on-demand-dosing  Chief Complaint  Patient presents with   Follow-up    HPI: Joe Dunlap is a 62 y.o.male who presents today for frequency.  Previous records reviewed.  He has been having daytime frequency of up to 20 trips to the restroom during the day and nocturia x 3-4 with urge incontinence.  He states this has been occurring since 1 month after his last UroLift.  Patient denies any modifying or aggravating factors.  Patient denies any recent UTI's, gross hematuria, dysuria or suprapubic/flank pain.  Patient denies any fevers, chills, nausea or vomiting.    He had been prescribed Myrbetriq, but he did not take the medication as it was too expensive and he would not be able to sustain the prescription.  UA with mild pyuria and hematuria.  Budding yeast is present.  PVR demonstrates adequate emptying.  KUB not obtained today due to long  wait time in radiology.  He did bring in fragments that he collected after his ESWL in June.  PMH: Past Medical History:  Diagnosis Date   Coronary artery disease    DDD (degenerative disc disease), lumbar    GERD (gastroesophageal reflux disease)    Headache    not intractable   History of kidney stones    Hyperlipemia    Hypertension    Impaired glucose tolerance    Kidney stones 2018   Labral tear of hip, degenerative    Lumbar radiculitis    Obesity (BMI 30-39.9)    Shingles    left T6   Sleep apnea    USES CPAP   Stomach ulcer     Surgical History: Past Surgical History:  Procedure Laterality Date   COLONOSCOPY WITH PROPOFOL N/A 02/11/2018   Procedure: COLONOSCOPY WITH PROPOFOL;  Surgeon: Christena Deem, MD;  Location: [facility] ENDOSCOPY;  Service: Endoscopy;  Laterality: N/A;   CYSTOSCOPY WITH INSERTION OF UROLIFT N/A 08/14/2019   Procedure: CYSTOSCOPY WITH INSERTION OF UROLIFT;  Surgeon: [name], MD;  Location: [facility] ORS;  Service: Urology;  Laterality: N/A;   CYSTOSCOPY WITH INSERTION OF UROLIFT N/A 09/08/2021   Procedure: CYSTOSCOPY WITH INSERTION OF UROLIFT;  Surgeon: [name], MD;  Location: [facility] ORS;  Service: Urology;  Laterality: N/A;   EXTRACORPOREAL SHOCK WAVE LITHOTRIPSY Right 06/25/2016   Procedure: EXTRACORPOREAL SHOCK WAVE LITHOTRIPSY (ESWL);  Surgeon: [name], MD;  Location: [facility] ORS;  Service: Urology;  Laterality: Right;   EXTRACORPOREAL SHOCK WAVE LITHOTRIPSY Right 08/13/2022   Procedure:  EXTRACORPOREAL SHOCK WAVE LITHOTRIPSY (ESWL);  Surgeon: Sondra Come, MD;  Location: [facility] ORS;  Service: Urology;  Laterality: Right;   GASTRIC ULCER REPAIR     KNEE SURGERY Left    LEFT HEART CATH AND CORONARY ANGIOGRAPHY Left 05/11/2018   Procedure: LEFT HEART CATH AND CORONARY ANGIOGRAPHY;  Surgeon: Marcina Millard, MD;  Location: [facility] INVASIVE CV LAB;  Service: Cardiovascular;  Laterality: Left;   TONSILLECTOMY     UPPER GI ENDOSCOPY   2008   stomach ulcer   UVULOPALATOPHARYNGOPLASTY (UPPP)/TONSILLECTOMY/SEPTOPLASTY  2000    Home Medications:  Allergies as of 11/30/2022       Reactions   Nsaids Other (See Comments)   Hx of ulcer        Medication List        Accurate as of November 30, 2022 11:51 AM. If you have any questions, ask your nurse or doctor.          STOP taking these medications    ampicillin 500 MG capsule Commonly known as: PRINCIPEN Stopped by: Othel Dicostanzo   tamsulosin 0.4 MG Caps capsule Commonly known as: FLOMAX Stopped by: Joanne Brander       TAKE these medications    amLODipine 10 MG tablet Commonly known as: NORVASC Take 10 mg by mouth daily. What changed: Another medication with the same name was removed. Continue taking this medication, and follow the directions you see here. Changed by: Michiel Cowboy   cyclobenzaprine 10 MG tablet Commonly known as: FLEXERIL Take 5-10 mg by mouth 2 (two) times daily as needed for muscle spasms.   isosorbide mononitrate 30 MG 24 hr tablet Commonly known as: IMDUR Take 30 mg by mouth daily.   metoprolol succinate 25 MG 24 hr tablet Commonly known as: TOPROL-XL Take 12.5 mg by mouth daily.   pravastatin 40 MG tablet Commonly known as: PRAVACHOL Take 40 mg by mouth at bedtime.        Allergies:  Allergies  Allergen Reactions   Nsaids Other (See Comments)    Hx of ulcer    Family History: Family History  Problem Relation Age of Onset   Heart attack Father    Prostatitis Neg Hx    Prostate cancer Neg Hx    Kidney cancer Neg Hx    Bladder Cancer Neg Hx     Social History:  reports that he has never smoked. He has never used smokeless tobacco. He reports current alcohol use of about 5.0 standard drinks of alcohol per week. He reports that he does not use drugs.   Physical Exam: BP 137/75 (BP Location: Left Arm, Patient Position: Sitting, Cuff Size: Normal)   Pulse 80   Ht 5\' 10"  (1.778 m)   Wt 249 lb  (112.9 kg)   BMI 35.73 kg/m   Constitutional:  Well nourished. Alert and oriented, No acute distress. HEENT: [place] AT, moist mucus membranes.  Trachea midline Cardiovascular: No clubbing, cyanosis, or edema. Respiratory: Normal respiratory effort, no increased work of breathing. Neurologic: Grossly intact, no focal deficits, moving all 4 extremities. Psychiatric: Normal mood and affect.   Laboratory Data: Urinalysis Component     Latest Ref Rng 11/30/2022  Color, Urine     YELLOW  YELLOW   Appearance     CLEAR  CLEAR   Specific Gravity, Urine     1.005 - 1.030  1.020   pH     5.0 - 8.0  6.5   Glucose, UA     NEGATIVE mg/dL  NEGATIVE   Hgb urine dipstick     NEGATIVE  TRACE !   Bilirubin Urine     NEGATIVE  NEGATIVE   Ketones, ur     NEGATIVE mg/dL NEGATIVE   Protein     NEGATIVE mg/dL NEGATIVE   Nitrite     NEGATIVE  NEGATIVE   Squamous Epithelial / HPF     0 - 5 /HPF 0-5   WBC, UA     0 - 5 WBC/hpf 6-10   RBC / HPF     0 - 5 RBC/hpf 6-10   Bacteria, UA     NONE SEEN  FEW !   Leukocytes,Ua     NEGATIVE  TRACE !   Budding Yeast PRESENT     Legend: ! Abnormal I have reviewed the labs.   Pertinent Imaging:  11/30/22 11:15  Scan Result 79ml   I have independently reviewed the films.  See HPI.  Radiologist interpretation still pending.  Assessment & Plan:    1. Frequency -UA with mild pyuria and hematuria -urine sent for culture to rule out indolent infection  2. Microscopic hematuria   UA with 6-10 RBC's -Urine culture pending -May be secondary to stone or infection -Will continue to monitor  3. BPH with urinary obstruction  -UA with mild pyuria, hematuria and budding yeast -We discussed starting an OAB agent for his frequency and nocturia, but he deferred citing he did not want to risk the side effects of anticholinergic and he cannot afford Myrbetriq or Gemtesa -Will schedule for repeat cystoscopy to evaluate for possible clip migration or stricture  development that may be contributing to his urinary symptoms  4. Nephrolithiasis -KUB, he states he will go over tomorrow to get his x-ray -I explained that it is important to get the x-ray to evaluate for possible stone burden and there is a small chance there may be a distal ureteral stone that is contributing to his symptoms -The fragments he brought in today we will send for analysis  Return for repeat cysto to evaluate for worsening symptoms after UroLift .  Michiel Cowboy, PA-C   [facility])  [address], [address] [place], [place] [address] [phone]

## 2022-11-30 ENCOUNTER — Other Ambulatory Visit
Admission: RE | Admit: 2022-11-30 | Discharge: 2022-11-30 | Disposition: A | Discharge status: Home / Self Care | Payer: BC Managed Care – PPO | Source: Home / Self Care | Attending: Urology | Admitting: Urology

## 2022-11-30 ENCOUNTER — Ambulatory Visit
Admission: RE | Admit: 2022-11-30 | Discharge: 2022-11-30 | Disposition: A | Discharge status: Home / Self Care | Payer: BC Managed Care – PPO | Source: Ambulatory Visit | Attending: Urology | Admitting: Urology

## 2022-11-30 ENCOUNTER — Ambulatory Visit
Admission: RE | Admit: 2022-11-30 | Discharge: 2022-11-30 | Disposition: A | Discharge status: Home / Self Care | Payer: BC Managed Care – PPO | Attending: Urology | Admitting: Urology

## 2022-11-30 ENCOUNTER — Encounter: Payer: Self-pay | Admitting: Urology

## 2022-11-30 ENCOUNTER — Ambulatory Visit: Payer: BC Managed Care – PPO | Admitting: Urology

## 2022-11-30 ENCOUNTER — Ambulatory Visit
Admission: RE | Admit: 2022-11-30 | Discharge: 2022-11-30 | Disposition: A | Discharge status: Home / Self Care | Payer: BC Managed Care – PPO | Source: Ambulatory Visit | Attending: Urology

## 2022-11-30 VITALS — BP 137/75 | HR 80 | Ht 70.0 in | Wt 249.0 lb

## 2022-11-30 DIAGNOSIS — N138 Other obstructive and reflux uropathy: Secondary | ICD-10-CM

## 2022-11-30 DIAGNOSIS — N401 Enlarged prostate with lower urinary tract symptoms: Secondary | ICD-10-CM

## 2022-11-30 DIAGNOSIS — R350 Frequency of micturition: Secondary | ICD-10-CM | POA: Insufficient documentation

## 2022-11-30 DIAGNOSIS — N200 Calculus of kidney: Secondary | ICD-10-CM

## 2022-11-30 DIAGNOSIS — R3129 Other microscopic hematuria: Secondary | ICD-10-CM | POA: Diagnosis not present

## 2022-11-30 DIAGNOSIS — Z87442 Personal history of urinary calculi: Secondary | ICD-10-CM

## 2022-11-30 LAB — URINALYSIS, COMPLETE (UACMP) WITH MICROSCOPIC
Bilirubin Urine: NEGATIVE
Glucose, UA: NEGATIVE mg/dL
Ketones, ur: NEGATIVE mg/dL
Nitrite: NEGATIVE
Protein, ur: NEGATIVE mg/dL
Specific Gravity, Urine: 1.02 (ref 1.005–1.030)
pH: 6.5 (ref 5.0–8.0)

## 2022-11-30 LAB — BLADDER SCAN AMB NON-IMAGING

## 2022-11-30 NOTE — Patient Instructions (Signed)

## 2022-12-02 ENCOUNTER — Other Ambulatory Visit: Payer: Self-pay | Admitting: Urology

## 2022-12-02 DIAGNOSIS — N390 Urinary tract infection, site not specified: Secondary | ICD-10-CM

## 2022-12-02 LAB — URINE CULTURE: Culture: 40000 — AB

## 2022-12-02 MED ORDER — AMPICILLIN 500 MG PO CAPS
500.0000 mg | ORAL_CAPSULE | Freq: Three times a day (TID) | ORAL | 0 refills | Status: DC
Start: 2022-12-02 — End: 2022-12-11

## 2022-12-08 LAB — CALCULI, WITH PHOTOGRAPH (CLINICAL LAB)
Calcium Oxalate Dihydrate: 10 %
Calcium Oxalate Monohydrate: 85 %
Hydroxyapatite: 5 %
Weight Calculi: 60 mg

## 2022-12-10 ENCOUNTER — Other Ambulatory Visit: Payer: Self-pay

## 2022-12-10 DIAGNOSIS — N401 Enlarged prostate with lower urinary tract symptoms: Secondary | ICD-10-CM

## 2022-12-11 ENCOUNTER — Ambulatory Visit: Payer: BC Managed Care – PPO | Admitting: Urology

## 2022-12-11 ENCOUNTER — Other Ambulatory Visit
Admission: RE | Admit: 2022-12-11 | Discharge: 2022-12-11 | Disposition: A | Discharge status: Home / Self Care | Payer: BC Managed Care – PPO | Attending: Urology | Admitting: Urology

## 2022-12-11 VITALS — BP 116/77 | HR 74

## 2022-12-11 DIAGNOSIS — N401 Enlarged prostate with lower urinary tract symptoms: Secondary | ICD-10-CM | POA: Insufficient documentation

## 2022-12-11 DIAGNOSIS — N138 Other obstructive and reflux uropathy: Secondary | ICD-10-CM | POA: Diagnosis present

## 2022-12-11 LAB — URINALYSIS, COMPLETE (UACMP) WITH MICROSCOPIC
Bilirubin Urine: NEGATIVE
Glucose, UA: NEGATIVE mg/dL
Ketones, ur: NEGATIVE mg/dL
Leukocytes,Ua: NEGATIVE
Nitrite: NEGATIVE
Protein, ur: NEGATIVE mg/dL
Specific Gravity, Urine: 1.02 (ref 1.005–1.030)
pH: 7 (ref 5.0–8.0)

## 2022-12-11 NOTE — Patient Instructions (Signed)
Holmium Laser Enucleation of the Prostate (HoLEP)  HoLEP is a treatment for men with benign prostatic hyperplasia (BPH). The laser surgery removed blockages of urine flow, and is done without any incisions on the body.     What is HoLEP?  HoLEP is a type of laser surgery used to treat obstruction (blockage) of urine flow as a result of benign prostatic hyperplasia (BPH). In men with BPH, the prostate gland is not cancerous, but has become enlarged. An enlarged prostate can result in a number of urinary tract symptoms such as weak urinary stream, difficulty in starting urination, inability to urinate, frequent urination, or getting up at night to urinate.  HoLEP was developed in the 1990's as a more effective and less expensive surgical option for BPH, compared to other surgical options such as laser vaporization(PVP/greenlight laser), transurethral resection of the prostate(TURP), and open simple prostatectomy.   What happens during a HoLEP?  HoLEP requires general anesthesia ("asleep" throughout the procedure).   An antibiotic is given to reduce the risk of infection  A surgical instrument called a resectoscope is inserted through the urethra (the tube that carries urine from the bladder). The resectoscope has a camera that allows the surgeon to view the internal structure of the prostate gland, and to see where the incisions are being made during surgery.  The laser is inserted into the resectoscope and is used to enucleate (free up) the enlarged prostate tissue from the capsule (outer shell) and then to seal up any blood vessels. The tissue that has been removed is pushed back into the bladder.  A morcellator is placed through the resectoscope, and is used to suction out the prostate tissue that has been pushed into the bladder.  When the prostate tissue has been removed, the resectoscope is removed, and a foley catheter is placed to allow healing and drain the urine from the  bladder.     What happens after a HoLEP?  More than 95% of patients go home the same day a few hours after surgery. Less than 5% will be admitted to the hospital overnight for observation to monitor the urine, or if they have other medical problems.  Fluid is flushed through the catheter for about 1 hour after surgery to clear any blood from the urine. It is normal to have some blood in the urine after surgery. The need for blood transfusion is extremely rare.  Eating and drinking are permitted after the procedure once the patient has fully awakened from anesthesia.  The catheter is usually removed 2-3 days after surgery- the patient will come to clinic to have the catheter removed and make sure they can urinate on their own.  It is very important to drink lots of fluids after surgery for one week to keep the bladder flushed.  At first, there may be some burning with urination, but this typically improved within a few hours to days. Most patients do not have a significant amount of pain, and narcotic pain medications are rarely needed.  Symptoms of urinary frequency, urgency, and even leakage are NORMAL for the first few weeks after surgery as the bladder adjusts after having to work hard against blockage from the prostate for many years. This will improve, but can sometimes take several months.  The use of pelvic floor exercises (Kegel exercises) can help improve problems with urinary incontinence.   After catheter removal, patients will be seen at 12 weeks and 6 months for symptom check  No heavy lifting for  at least 2-3 weeks after surgery, however patients can walk and do light activities the first day after surgery. Return to work time depends on occupation.    What are the advantages of HoLEP?  HoLEP has been studied in many different parts of the world and has been shown to be a safe and effective procedure. Although there are many types of BPH surgeries available, HoLEP offers a  unique advantage in being able to remove a large amount of tissue without any incisions on the body, even in very large prostates, while decreasing the risk of bleeding and providing tissue for pathology (to look for cancer). This decreases the need for blood transfusions during surgery, minimizes hospital stay, and reduces the risk of needing repeat treatment.  What are the side effects of HoLEP?  Temporary burning and bleeding during urination. Some blood may be seen in the urine for weeks after surgery and is part of the healing process.  Urinary incontinence (inability to control urine flow) is expected in all patients immediately after surgery and they should wear pads for the first few days/weeks. This typically improves over the course of several weeks. Performing Kegel exercises can help decrease leakage from stress maneuvers such as coughing, sneezing, or lifting. The rate of long term leakage is very low, 1-2%. Patients may also have leakage with urgency and this may be treated with medication. The risk of urge incontinence can be dependent on several factors including age, prostate size, symptoms, and other medical problems.  Retrograde ejaculation or "backwards ejaculation." In 80% of cases, the patient will not see any fluid during ejaculation after surgery.  Erectile function is generally not significantly affected.   What are the risks of HoLEP?  Injury to the urethra or development of scar tissue at a later date  Injury to the capsule of the prostate (typically treated with longer catheterization).  Injury to the bladder or ureteral orifices (where the urine from the kidney drains out)  Infection of the bladder, testes, or kidneys (~4%)  Return of urinary obstruction at a later date requiring another operation (<2%)  Need for blood transfusion or re-operation due to bleeding  Failure to relieve all symptoms and/or need for prolonged catheterization after surgery  5-15% of  patients are found to have previously undiagnosed prostate cancer in their specimen. Prostate cancer can be treated after HoLEP.  Standard risks of anesthesia including blood clots, heart attacks, etc ~1-2% risk of long term urinary incontinence (leakage)  When should I call my doctor?  Fever over 101.3 degrees  Inability to urinate, or large blood clots in the urine  Transurethral Resection of the Prostate Transurethral resection of the prostate (TURP) is the removal, or resection, of part of the prostate tissue. This procedure is done to treat an enlarged prostate gland (benign prostatic hyperplasia). The goal of TURP is to remove enough prostate tissue to allow for a normal flow of urine. The procedure will allow you to empty your bladder more completely when you urinate so that you can urinate less often. In a transurethral resection, a thin telescope with a light, a camera, and an electric cutting edge (resectoscope) is passed through the urethra and into the prostate. The opening of the urethra is at the end of the penis. Tell a health care provider about: Any allergies you have. All medicines you are taking, including vitamins, herbs, eye drops, creams, and over-the-counter medicines. Any problems you or family members have had with anesthetic medicines. Any bleeding problems  you have. Any surgeries you have had. Any medical conditions you have. Any prostate infections you have had. What are the risks? Generally, this is a safe procedure. However, problems may occur, including: Infection. Bleeding. Allergic reactions to medicines. Blood in the urine (hematuria). Damage to nearby structures or organs. Other problems may occur, but they are rare. They include: Dry ejaculation, or having no semen come out during orgasm. Erectile dysfunction, or being unable to have or keep an erection. Scarring that leads to narrowing of the urethra. This narrowing may block the flow of  urine. Inability to control when you urinate (incontinence). Deep vein thrombosis. This is a blood clot that can develop in your leg. TURP syndrome. This can happen when you lose too much sodium during or after the procedure. Some signs and symptoms of this condition include: Weakness. Headaches. Nausea or vomiting. Muscle cramping. What happens before the procedure? When to stop eating and drinking Follow instructions from your health care provider about what you may eat and drink before your procedure. These may include: 8 hours before your procedure Stop eating most foods. Do not eat meat, fried foods, or fatty foods. Eat only light foods, such as toast or crackers. All liquids are okay except energy drinks and alcohol. 6 hours before your procedure Stop eating. Drink only clear liquids, such as water, clear fruit juice, black coffee, plain tea, and sports drinks. Do not drink energy drinks or alcohol. 2 hours before your procedure Stop drinking all liquids. You may be allowed to take medicines with small sips of water. If you do not follow your health care provider's instructions, your procedure may be delayed or canceled. Medicines Ask your health care provider about: Changing or stopping your regular medicines. This is especially important if you are taking diabetes medicines or blood thinners. Taking medicines such as aspirin and ibuprofen. These medicines can thin your blood. Do not take these medicines unless your health care provider tells you to take them. Taking over-the-counter medicines, vitamins, herbs, and supplements. Surgery safety Ask your health care provider what steps will be taken to help prevent infection. These steps may include: Removing hair at the surgery site. Washing skin with a germ-killing soap. Taking antibiotic medicine. General instructions Do not use any products that contain nicotine or tobacco for at least 4 weeks before the procedure. These  products include cigarettes, chewing tobacco, and vaping devices, such as e-cigarettes. If you need help quitting, ask your health care provider. If you will be going home right after the procedure, plan to have a responsible adult: Take you home from the hospital or clinic. You will not be allowed to drive. Care for you for the time you are told. What happens during the procedure?  An IV will be inserted into one of your veins. You will be given one or more of the following: A medicine to help you relax (sedative). A medicine to make you fall asleep (general anesthetic). A medicine that is injected into your spine to numb the area below and slightly above the injection site (spinal anesthetic). Your legs will be placed in foot rests (stirrups) so that your legs are apart and your knees are bent. The resectoscope will be passed through your urethra to your prostate. Parts of your prostate will be resected using the cutting edge of the resectoscope. Fluid will be passed to rinse out the cut tissues (irrigation). The resectoscope will be removed. A small, thin tube (catheter) will be passed through your urethra  and into your bladder. The catheter will drain urine into a bag outside of your body. The procedure may vary among health care providers and hospitals. What happens after the procedure? Your blood pressure, heart rate, breathing rate, and blood oxygen level will be monitored until you leave the hospital or clinic. You will be given fluids through the IV. The IV will be removed when you start eating and drinking normally. You may have some pain. Pain medicine will be available to help you. You will have a catheter draining your urine. You may have blood in your urine. Your catheter may be kept in until your urine is clear. Your urinary drainage will be monitored. If necessary, your bladder may be rinsed out (irrigated) through your catheter. You will be encouraged to walk around as soon  as possible. You may have to wear compression stockings. These stockings help to prevent blood clots and reduce swelling in your legs. If you were given a sedative during the procedure, it can affect you for several hours. Do not drive or operate machinery until your health care provider says that it is safe. Summary Transurethral resection of the prostate (TURP) is the removal (resection) of part of the prostate tissue. The goal of this procedure is to remove enough prostate tissue to allow for a normal flow of urine. Follow instructions from your health care provider about taking medicines and about eating and drinking before the procedure. This information is not intended to replace advice given to you by your health care provider. Make sure you discuss any questions you have with your health care provider. Document Revised: 11/12/2020 Document Reviewed: 11/12/2020 Elsevier Patient Education  2024 [employer].

## 2022-12-11 NOTE — Progress Notes (Signed)
   12/11/22  CC:  Chief Complaint  Patient presents with   Cysto    HPI: 62 year old male with a personal history of BPH with LUTS, recurrent nephrolithiasis who presents today for cystoscopic evaluation.  He is undergoing UroLift x 2, initially in 2021 and then a redo in 2023.  Each time, he had dramatic improvement in his urinary symptoms postoperatively and then developed recurrence.  Blood pressure 116/77, pulse 74. NED. A&Ox3.   No respiratory distress   Abd soft, NT, ND Normal phallus with bilateral descended testicles  Cystoscopy Procedure Note  Patient identification was confirmed, informed consent was obtained, and patient was prepped using Betadine solution.  Lidocaine jelly was administered per urethral meatus.     Pre-Procedure: - Inspection reveals a normal caliber ureteral meatus.  Procedure: The flexible cystoscope was introduced without difficulty - No urethral strictures/lesions are present. - Normal prostate with evidence of anterior channel along with UroLift clips present creating this defect - Normal bladder neck - Bilateral ureteral orifices identified - Bladder mucosa  reveals no ulcers, tumors, or lesions - No bladder stones - No trabeculation  Retroflexion unremarkable   Post-Procedure: - Patient tolerated the procedure well  Assessment/ Plan:  1. Benign prostatic hyperplasia with urinary obstruction Cystoscopy today was fairly unremarkable, there is evidence of anterior channel when his bladder looks normal.  There is no evidence of clip migration or malpositioning.  In the past, he has had evidence of outlet obstruction.  Is unclear whether that is ongoing.  Certainly he has refractory urinary symptoms.  We briefly discussed TURP versus HoLEP today given failure of UroLift x 2.  Will have him return to discuss these in further detail.    [name], MD

## 2022-12-25 ENCOUNTER — Encounter: Payer: Self-pay | Admitting: Urology

## 2022-12-25 ENCOUNTER — Ambulatory Visit: Payer: BC Managed Care – PPO | Admitting: Urology

## 2022-12-25 VITALS — BP 134/79 | HR 81 | Ht 70.0 in | Wt 249.0 lb

## 2022-12-25 DIAGNOSIS — N401 Enlarged prostate with lower urinary tract symptoms: Secondary | ICD-10-CM

## 2022-12-25 DIAGNOSIS — N400 Enlarged prostate without lower urinary tract symptoms: Secondary | ICD-10-CM | POA: Diagnosis not present

## 2022-12-25 NOTE — Progress Notes (Signed)
Marcelle Overlie Plume,acting as a scribe for [name], MD.,have documented all relevant documentation on the behalf of [name], MD,as directed by  [name], MD while in the presence of [name], MD.  12/25/2022 11:17 AM   Joe Dunlap [facility] 1960-10-10 [id number]  Referring provider: Marina Goodell, MD [address] [place],  [place] [address]  Chief Complaint  Patient presents with   Consult    HPI: 62 year-old male who presents today for further evaluation of refractory urinary symptoms.   He has undergone UroLift x2. He did have urodynamics that did show he in fact had an obstruction from his prostate. Both of his results from his UroLift have been transient.   He returns today to discuss the next steps in resolving his urinary issues.   Please see numerous previous notes for details.    PMH: Past Medical History:  Diagnosis Date   Coronary artery disease    DDD (degenerative disc disease), lumbar    GERD (gastroesophageal reflux disease)    Headache    not intractable   History of kidney stones    Hyperlipemia    Hypertension    Impaired glucose tolerance    Kidney stones 2018   Labral tear of hip, degenerative    Lumbar radiculitis    Obesity (BMI 30-39.9)    Shingles    left T6   Sleep apnea    USES CPAP   Stomach ulcer     Surgical History: Past Surgical History:  Procedure Laterality Date   COLONOSCOPY WITH PROPOFOL N/A 02/11/2018   Procedure: COLONOSCOPY WITH PROPOFOL;  Surgeon: Christena Deem, MD;  Location: [facility] ENDOSCOPY;  Service: Endoscopy;  Laterality: N/A;   CYSTOSCOPY WITH INSERTION OF UROLIFT N/A 08/14/2019   Procedure: CYSTOSCOPY WITH INSERTION OF UROLIFT;  Surgeon: [name], MD;  Location: [facility] ORS;  Service: Urology;  Laterality: N/A;   CYSTOSCOPY WITH INSERTION OF UROLIFT N/A 09/08/2021   Procedure: CYSTOSCOPY WITH INSERTION OF UROLIFT;  Surgeon: [name], MD;  Location: [facility] ORS;  Service: Urology;   Laterality: N/A;   EXTRACORPOREAL SHOCK WAVE LITHOTRIPSY Right 06/25/2016   Procedure: EXTRACORPOREAL SHOCK WAVE LITHOTRIPSY (ESWL);  Surgeon: [name], MD;  Location: [facility] ORS;  Service: Urology;  Laterality: Right;   EXTRACORPOREAL SHOCK WAVE LITHOTRIPSY Right 08/13/2022   Procedure: EXTRACORPOREAL SHOCK WAVE LITHOTRIPSY (ESWL);  Surgeon: Sondra Come, MD;  Location: [facility] ORS;  Service: Urology;  Laterality: Right;   GASTRIC ULCER REPAIR     KNEE SURGERY Left    LEFT HEART CATH AND CORONARY ANGIOGRAPHY Left 05/11/2018   Procedure: LEFT HEART CATH AND CORONARY ANGIOGRAPHY;  Surgeon: Marcina Millard, MD;  Location: [facility] INVASIVE CV LAB;  Service: Cardiovascular;  Laterality: Left;   TONSILLECTOMY     UPPER GI ENDOSCOPY  2008   stomach ulcer   UVULOPALATOPHARYNGOPLASTY (UPPP)/TONSILLECTOMY/SEPTOPLASTY  2000    Home Medications:  Allergies as of 12/25/2022       Reactions   Nsaids Other (See Comments)   Hx of ulcer        Medication List        Accurate as of December 25, 2022 11:17 AM. If you have any questions, ask your nurse or doctor.          amLODipine 10 MG tablet Commonly known as: NORVASC Take 10 mg by mouth daily.   cyclobenzaprine 10 MG tablet Commonly known as: FLEXERIL Take 5-10 mg by mouth 2 (two) times daily as needed for  muscle spasms.   isosorbide mononitrate 30 MG 24 hr tablet Commonly known as: IMDUR Take 30 mg by mouth daily.   metoprolol succinate 25 MG 24 hr tablet Commonly known as: TOPROL-XL Take 12.5 mg by mouth daily.   pravastatin 40 MG tablet Commonly known as: PRAVACHOL Take 40 mg by mouth at bedtime.        Allergies:  Allergies  Allergen Reactions   Nsaids Other (See Comments)    Hx of ulcer    Family History: Family History  Problem Relation Age of Onset   Heart attack Father    Prostatitis Neg Hx    Prostate cancer Neg Hx    Kidney cancer Neg Hx    Bladder Cancer Neg Hx     Social History:  reports  that he has never smoked. He has never used smokeless tobacco. He reports current alcohol use of about 5.0 standard drinks of alcohol per week. He reports that he does not use drugs.   Physical Exam: BP 134/79 (BP Location: Left Arm, Patient Position: Sitting, Cuff Size: Large)   Pulse 81   Ht 5\' 10"  (1.778 m)   Wt 249 lb (112.9 kg)   BMI 35.73 kg/m   Constitutional:  Alert and oriented, No acute distress. HEENT: [place] AT, moist mucus membranes.  Trachea midline, no masses. Neurologic: Grossly intact, no focal deficits, moving all 4 extremities. Psychiatric: Normal mood and affect.   Assessment & Plan:    1. BPH - Status post UroLift x2 with urinary symptom recurrence shortly after procedure - Discussed the option of TURP as a more definitive treatment for BPH.  - Explained the procedure, potential benefits, and risks, including retrograde ejaculation, bleeding, and possible need for overnight hospitalization. - He is considering TURP and is aware of the potential outcomes and risks; unable to definitively guarantee that I will address his bothersome urinary symptoms which are primarily irritative in nature.  He understands and is motivated to proceed. - Surgery tentatively planned for late November or December. - Instructed patient on catheter use post-operatively, including how to manage and empty the catheter bag. - Pre-op urinalysis closer to procedure date to screen for infection  Return for TURP.  .I have reviewed the above documentation for accuracy and completeness, and I agree with the above.   [name], MD    [facility] [address], [address] [place], [place] [address] [phone]

## 2022-12-28 ENCOUNTER — Telehealth: Payer: Self-pay

## 2022-12-28 ENCOUNTER — Other Ambulatory Visit: Payer: Self-pay

## 2022-12-28 DIAGNOSIS — N138 Other obstructive and reflux uropathy: Secondary | ICD-10-CM

## 2022-12-28 NOTE — Telephone Encounter (Addendum)
Per Dr. Apolinar Junes, Patient is to be scheduled for Transurethral Resection of the Prostate   Mr. Joe Dunlap was contacted and possible surgical dates were discussed, Monday April 14th, 2025 was agreed upon for surgery. Patient was instructed that Dr. Apolinar Junes will require them to provide a pre-op UA & CX prior to surgery. This was ordered and scheduled drop off appointment was made for 06/01/2023 in [place].   Patient was directed to call [phone] between 1-3pm the day before surgery to find out surgical arrival time.  Instructions were given not to eat or drink from midnight on the night before surgery and have a driver for the day of surgery. On the surgery day patient was instructed to enter through the [facility] entrance of [facility] report the Same Day Surgery desk.   Pre-Admit Testing will be in contact via phone to set up an interview with the anesthesia team to review your history and medications prior to surgery.   Reminder of this information was sent via MyChart to the patient.

## 2022-12-28 NOTE — Progress Notes (Signed)
Surgical Physician Order Form [facility]  Dr. [name], MD  * Scheduling expectation : Next Available  *Length of Case:   *Clearance needed: no  *Anticoagulation Instructions: Hold all anticoagulants  *Aspirin Instructions: Hold Aspirin  *Post-op visit Date/Instructions:  Foley removal 2 days post op, 4-6 week w/PVR/ IPSS  *Diagnosis: BPH w/urinary obstruction  *Procedure:  TURP   Additional orders: N/A  -Admit type: OUTpatient  -Anesthesia: General  -VTE Prophylaxis Standing Order SCD's       Other:   -Standing Lab Orders Per Anesthesia    Lab other: UA&Urine Culture  -Standing Test orders EKG/Chest x-ray per Anesthesia       Test other:   - Medications:  Ancef 2gm IV  -Other orders:  N/A

## 2022-12-28 NOTE — Progress Notes (Addendum)
   [facility] Surgical Posting Form  Surgery Date: 06/14/2023  Surgeon: Dr. [name], MD  Inpt ( No  )   Outpt (Yes)   Obs ( No  )   Diagnosis: N40.1, N13.8 Benign Prostatic Hyperplasia with Urinary Obstruction  -CPT: 42595  Surgery: Transurethral Resection of the Prostate  Stop Anticoagulations: Yes and also hold ASA   Cardiac/Medical/Pulmonary Clearance needed: no  *Orders entered into EPIC  Date: 12/28/22   *Case booked in EPIC  Date: 12/28/22  *Notified pt of Surgery: Date: 12/28/22  PRE-OP UA & CX: yes, will obtain in [place] on 12/2 or 12/3  *Placed into Prior Authorization Work Angela Nevin Date: 12/28/22  Assistant/laser/rep:No

## 2023-02-08 ENCOUNTER — Other Ambulatory Visit: Payer: BC Managed Care – PPO

## 2023-02-17 ENCOUNTER — Encounter: Payer: BC Managed Care – PPO | Admitting: Urology

## 2023-03-24 ENCOUNTER — Ambulatory Visit: Payer: BC Managed Care – PPO | Admitting: Urology

## 2023-06-04 ENCOUNTER — Other Ambulatory Visit: Payer: Self-pay

## 2023-06-04 ENCOUNTER — Encounter
Admission: RE | Admit: 2023-06-04 | Discharge: 2023-06-04 | Disposition: A | Discharge status: Home / Self Care | Payer: BC Managed Care – PPO | Source: Ambulatory Visit | Attending: Urology | Admitting: Urology

## 2023-06-04 DIAGNOSIS — I10 Essential (primary) hypertension: Secondary | ICD-10-CM

## 2023-06-04 HISTORY — DX: Other obstructive and reflux uropathy: N13.8

## 2023-06-04 HISTORY — DX: Angina pectoris, unspecified: I20.9

## 2023-06-04 HISTORY — DX: Atherosclerotic heart disease of native coronary artery without angina pectoris: I25.10

## 2023-06-04 NOTE — Patient Instructions (Addendum)
 Your procedure is scheduled on: 06/14/23 - Monday Report to the Registration Desk on the 1st floor of the [facility]. To find out your arrival time, please call [phone] between 1PM - 3PM on: 06/11/23 - Friday If your arrival time is 6:00 am, do not arrive before that time as the [facility] entrance doors do not open until 6:00 am.  REMEMBER: Instructions that are not followed completely may result in serious medical risk, up to and including death; or upon the discretion of your surgeon and anesthesiologist your surgery may need to be rescheduled.  Do not eat food or drink any liquids after midnight the night before surgery.  No gum chewing or hard candies.   Beginning 06/07/23 ,One week prior to surgery: Stop Anti-inflammatories (NSAIDS) such as Advil, Aleve, Ibuprofen, Motrin, Naproxen, Naprosyn and Aspirin based products such as Excedrin, Goody's Powder, BC Powder. You may take Tylenol if needed for pain up until the day of surgery.  Stop ANY OVER THE COUNTER supplements until after surgery.  ON THE DAY OF SURGERY ONLY TAKE THESE MEDICATIONS WITH SIPS OF WATER:   omeprazole (PRILOSEC OTC)    No Alcohol for 24 hours before or after surgery.  No Smoking including e-cigarettes for 24 hours before surgery.  No chewable tobacco products for at least 6 hours before surgery.  No nicotine patches on the day of surgery.  Do not use any "recreational" drugs for at least a week (preferably 2 weeks) before your surgery.  Please be advised that the combination of cocaine and anesthesia may have negative outcomes, up to and including death. If you test positive for cocaine, your surgery will be cancelled.  On the morning of surgery brush your teeth with toothpaste and water, you may rinse your mouth with mouthwash if you wish. Do not swallow any toothpaste or mouthwash.  Do not wear jewelry, make-up, hairpins, clips or nail polish.  For welded (permanent) jewelry: bracelets,  anklets, waist bands, etc.  Please have this removed prior to surgery.  If it is not removed, there is a chance that hospital personnel will need to cut it off on the day of surgery.  Do not wear lotions, powders, or perfumes.   Do not shave body hair from the neck down 48 hours before surgery.  Contact lenses, hearing aids and dentures may not be worn into surgery.  Do not bring valuables to the hospital. [facility] is not responsible for any missing/lost belongings or valuables.   Bring your C-PAP to the hospital in case you may have to spend the night.   Notify your doctor if there is any change in your medical condition (cold, fever, infection).  Wear comfortable clothing (specific to your surgery type) to the hospital.  After surgery, you can help prevent lung complications by doing breathing exercises.  Take deep breaths and cough every 1-2 hours. Your doctor may order a device called an Incentive Spirometer to help you take deep breaths.  When coughing or sneezing, hold a pillow firmly against your incision with both hands. This is called "splinting." Doing this helps protect your incision. It also decreases belly discomfort.  If you are being admitted to the hospital overnight, leave your suitcase in the car. After surgery it may be brought to your room.  In case of increased patient census, it may be necessary for you, the patient, to continue your postoperative care in the Same Day Surgery department.  If you are being discharged the day of  surgery, you will not be allowed to drive home. You will need a responsible individual to drive you home and stay with you for 24 hours after surgery.   If you are taking public transportation, you will need to have a responsible individual with you.  Please call the Pre-admissions Testing Dept. at [phone] if you have any questions about these instructions.  Surgery Visitation Policy:  Patients having surgery or a procedure may  have two visitors.  Children under the age of 57 must have an adult with them who is not the patient.  Inpatient Visitation:    Visiting hours are 7 a.m. to 8 p.m. Up to four visitors are allowed at one time in a patient room. The visitors may rotate out with other people during the day.  One visitor age 25 or older may stay with the patient overnight and must be in the room by 8 p.m.

## 2023-06-07 ENCOUNTER — Encounter
Admission: RE | Admit: 2023-06-07 | Discharge: 2023-06-07 | Disposition: A | Discharge status: Home / Self Care | Source: Ambulatory Visit | Attending: Urology | Admitting: Urology

## 2023-06-07 DIAGNOSIS — I10 Essential (primary) hypertension: Secondary | ICD-10-CM | POA: Diagnosis not present

## 2023-06-07 DIAGNOSIS — Z01818 Encounter for other preprocedural examination: Secondary | ICD-10-CM | POA: Diagnosis present

## 2023-06-07 DIAGNOSIS — Z01812 Encounter for preprocedural laboratory examination: Secondary | ICD-10-CM | POA: Insufficient documentation

## 2023-06-07 LAB — BASIC METABOLIC PANEL WITH GFR
Anion gap: 8 (ref 5–15)
BUN: 14 mg/dL (ref 8–23)
CO2: 21 mmol/L — ABNORMAL LOW (ref 22–32)
Calcium: 8.8 mg/dL — ABNORMAL LOW (ref 8.9–10.3)
Chloride: 109 mmol/L (ref 98–111)
Creatinine, Ser: 0.89 mg/dL (ref 0.61–1.24)
GFR, Estimated: >60 mL/min (ref >60)
Glucose, Bld: 125 mg/dL — ABNORMAL HIGH (ref 70–99)
Potassium: 3.7 mmol/L (ref 3.5–5.1)
Sodium: 138 mmol/L (ref 135–145)

## 2023-06-07 LAB — CBC
HCT: 42.4 % (ref 39.0–52.0)
Hemoglobin: 14.8 g/dL (ref 13.0–17.0)
MCH: 31 pg (ref 26.0–34.0)
MCHC: 34.9 g/dL (ref 30.0–36.0)
MCV: 88.9 fL (ref 80.0–100.0)
Platelets: 203 10*3/uL (ref 150–400)
RBC: 4.77 MIL/uL (ref 4.22–5.81)
RDW: 12.8 % (ref 11.5–15.5)
WBC: 6.6 10*3/uL (ref 4.0–10.5)
nRBC: 0 % (ref 0.0–0.2)

## 2023-06-08 ENCOUNTER — Encounter: Payer: Self-pay | Admitting: Urology

## 2023-06-08 NOTE — Progress Notes (Signed)
 Perioperative / Anesthesia Services  Pre-Admission Testing Clinical Review / Pre-Operative Anesthesia Consult  Date: 06/08/23  Patient Demographics:  Name: Joe Dunlap DOB: [birth year] MRN:   [id number]  Planned Surgical Procedure(s):    Case: [id number] Date/Time: 06/14/23 1121   Procedure: TURP (TRANSURETHRAL RESECTION OF PROSTATE)   Anesthesia type: General   Pre-op diagnosis: Benign Prostatic Hyperplasia with Urinary Obstruction   Location: [facility] OR ROOM 10 / [facility]S FOR ANEST[facility]   Surgeons: [name], MD      NOTE: Available PAT nursing documentation and vital signs have been reviewed. Clinical nursing staff has updated patient's PMH/PSHx, current medication list, and drug allergies/intolerances to ensure comprehensive history available to assist in medical decision making as it pertains to the aforementioned surgical procedure and anticipated anesthetic course. Extensive review of available clinical information personally performed. [place] PMH and PSHx updated with any diagnoses/procedures that  may have been inadvertently omitted during his intake with the pre-admission testing department's nursing staff.  Clinical Discussion:  Joe Dunlap is a 63 y.o. male who is submitted for pre-surgical anesthesia review and clearance prior to him undergoing the above procedure. Patient has never been a smoker in the past. Pertinent PMH includes: CAD, aortic atherosclerosis, angina, BILATERAL carotid artery disease, HTN, HLD, OSAH (requires nocturnal PAP therapy), GERD (on daily PPI), peptic ulcer, large ventral hernia, BPH, nephrolithiasis, lumbar radiculitis.  Patient is followed by cardiology Juliann Pares, MD). He was last seen in the cardiology clinic on 03/29/2023; notes reviewed. At the time of his clinic visit, patient complaining of intermittent episodes of chest pain that he described as a tightness/pressure sensation.  Symptoms lasts for 1-2 hours and was  reported to be self-limiting.  Symptom experienced in the setting of stress while at work.  He had no associated shortness of breath.  Patient denied PND, orthopnea, palpitations, significant peripheral edema, weakness, fatigue, vertiginous symptoms, or presyncope/syncope. Patient with a past medical history significant for cardiovascular diagnoses. Documented physical exam was grossly benign, providing no evidence of acute exacerbation and/or decompensation of the patient's known cardiovascular conditions.  Patient underwent diagnostic LEFT heart catheterization on 05/11/2018 revealing multivessel CAD; 60% proximal LCx, 30% ostial -proximal CX, 30% ostial D1, 75% acute marginal, and 30% distal RCA.  Interventional cardiology made the decision to defer intervention opting for aggressive medical management.  Most recent TTE performed on 07/23/2022 revealed a normal left ventricular systolic function with an EF of >55% %.  There were no regional wall motion abnormalities.  Left ventricular diastolic Doppler parameters were normal.  Right ventricular size and function normal. There was trivial to mild pan valvular regurgitation. All transvalvular gradients were noted to be normal providing no evidence suggestive of valvular stenosis. Aorta normal in size with no evidence of ectasia or aneurysmal dilatation.  Most recent myocardial perfusion imaging study was performed on 07/30/2022 revealing a  normal left ventricular systolic function with an EF of 63%.  There were no regional wall motion abnormalities.  No artifact or left ventricular cavity size enlargement appreciated on review of imaging. SPECT images demonstrated no evidence of stress-induced myocardial ischemia or arrhythmia; no scintigraphic evidence of scar.  TID ratio = 0.91. Study determined to be normal and low risk.  Blood pressure well controlled at 132/74 mmHg on currently prescribed CCB (amlodipine), nitrate (isosorbide mononitrate), and  beta-blocker (metoprolol succinate) therapies.  Patient is on pravastatin for his HLD diagnosis and ASCVD prevention. Patient is not diabetic.  Patient does have an OSAH diagnosis and is  reported be compliant with prescribed nocturnal PAP therapy. Patient is able to complete all of his  ADL/IADLs without cardiovascular limitation.  Per the [facility], patient is able to achieve at least 4 METS of physical activity without experiencing any significant degree of angina/anginal equivalent symptoms.  No changes were made to his medication regimen during his visit with cardiology.  Patient scheduled to follow-up with outpatient cardiology in 2 months or sooner if needed.  Joe Dunlap is scheduled for an elective TURP (TRANSURETHRAL RESECTION OF PROSTATE) on 06/14/2023 with Dr. [name], MD.  Given patient's past medical history significant for cardiovascular diagnoses, presurgical cardiac clearance was sought by the PAT team. ***  In review of his medication reconciliation, the patient is not noted to be taking any type of anticoagulation or antiplatelet therapies that would need to be held during his perioperative course.  Patient denies previous perioperative complications with anesthesia in the past. In review his EMR, it is noted that patient underwent a general anesthetic course here at [facility] in 08/2021 without documented complications.      12/25/2022    9:07 AM 12/11/2022   10:48 AM 11/30/2022   11:14 AM  Vitals with BMI  Height 5\' 10"   5\' 10"   Weight 249 lbs  249 lbs  BMI 35.73  35.73  Systolic 134 116 191  Diastolic 79 77 75  Pulse 81 74 80   Providers/Specialists:  NOTE: Primary physician provider listed below. Patient may have been seen by APP or partner within same practice.   PROVIDER ROLE / SPECIALTY LAST Jaquelyn Bitter, MD Urology (Surgeon) 12/25/2022  Marina Goodell, MD Primary Care Provider 01/04/2023  Rudean Hitt, MD Cardiology 03/29/2023   Allergies:   Allergies  Allergen Reactions   Nsaids Other (See Comments)    Hx of ulcer   Current Home Medications:   No current facility-administered medications for this encounter.    amLODipine (NORVASC) 10 MG tablet   isosorbide mononitrate (IMDUR) 30 MG 24 hr tablet   metoprolol succinate (TOPROL-XL) 25 MG 24 hr tablet   omeprazole (PRILOSEC OTC) 20 MG tablet   pravastatin (PRAVACHOL) 40 MG tablet   Simethicone (GAS-X PO)   History:   Past Medical History:  Diagnosis Date   Anginal pain (HCC)    Benign prostatic hyperplasia with urinary obstruction    Bilateral carotid artery disease (HCC)    Coronary artery disease    DDD (degenerative disc disease), lumbar    GERD (gastroesophageal reflux disease)    Headache    Hyperlipemia    Hypertension    Impaired glucose tolerance    Kidney stones 2018   Labral tear of hip, degenerative    Lumbar radiculitis    Obesity (BMI 30-39.9)    OSA on CPAP    Shingles    Stomach ulcer    Past Surgical History:  Procedure Laterality Date   COLONOSCOPY WITH PROPOFOL N/A 02/11/2018   Procedure: COLONOSCOPY WITH PROPOFOL;  Surgeon: Christena Deem, MD;  Location: [facility] ENDOSCOPY;  Service: Endoscopy;  Laterality: N/A;   CYSTOSCOPY WITH INSERTION OF UROLIFT N/A 08/14/2019   Procedure: CYSTOSCOPY WITH INSERTION OF UROLIFT;  Surgeon: [name], MD;  Location: [facility] ORS;  Service: Urology;  Laterality: N/A;   CYSTOSCOPY WITH INSERTION OF UROLIFT N/A 09/08/2021   Procedure: CYSTOSCOPY WITH INSERTION OF UROLIFT;  Surgeon: [name], MD;  Location: [facility] ORS;  Service: Urology;  Laterality: N/A;   EXTRACORPOREAL  SHOCK WAVE LITHOTRIPSY Right 06/25/2016   Procedure: EXTRACORPOREAL SHOCK WAVE LITHOTRIPSY (ESWL);  Surgeon: [name], MD;  Location: [facility] ORS;  Service: Urology;  Laterality: Right;   EXTRACORPOREAL SHOCK WAVE LITHOTRIPSY Right 08/13/2022   Procedure: EXTRACORPOREAL SHOCK WAVE  LITHOTRIPSY (ESWL);  Surgeon: Sondra Come, MD;  Location: [facility] ORS;  Service: Urology;  Laterality: Right;   GASTRIC ULCER REPAIR     KNEE SURGERY Left    LEFT HEART CATH AND CORONARY ANGIOGRAPHY Left 05/11/2018   Procedure: LEFT HEART CATH AND CORONARY ANGIOGRAPHY;  Surgeon: Marcina Millard, MD;  Location: [facility] INVASIVE CV LAB;  Service: Cardiovascular;  Laterality: Left;   TONSILLECTOMY     UPPER GI ENDOSCOPY  2008   stomach ulcer   UVULOPALATOPHARYNGOPLASTY (UPPP)/TONSILLECTOMY/SEPTOPLASTY  2000   Family History  Problem Relation Age of Onset   Heart attack Father    Prostatitis Neg Hx    Prostate cancer Neg Hx    Kidney cancer Neg Hx    Bladder Cancer Neg Hx    Social History   Tobacco Use   Smoking status: Never   Smokeless tobacco: Never  Substance Use Topics   Alcohol use: Not Currently    Alcohol/week: 5.0 standard drinks of alcohol    Types: 5 Shots of liquor per week   Pertinent Clinical Results:  LABS:  Hospital Outpatient Visit on 06/07/2023  Component Date Value Ref Range Status   WBC 06/07/2023 6.6  4.0 - 10.5 K/uL Final   RBC 06/07/2023 4.77  4.22 - 5.81 MIL/uL Final   Hemoglobin 06/07/2023 14.8  13.0 - 17.0 g/dL Final   HCT 19/14/7829 42.4  39.0 - 52.0 % Final   MCV 06/07/2023 88.9  80.0 - 100.0 fL Final   MCH 06/07/2023 31.0  26.0 - 34.0 pg Final   MCHC 06/07/2023 34.9  30.0 - 36.0 g/dL Final   RDW 56/21/3086 12.8  11.5 - 15.5 % Final   Platelets 06/07/2023 203  150 - 400 K/uL Final   nRBC 06/07/2023 0.0  0.0 - 0.2 % Final   Performed at [facility], [address] [place], [place] [address]   Sodium 06/07/2023 138  135 - 145 mmol/L Final   Potassium 06/07/2023 3.7  3.5 - 5.1 mmol/L Final   Chloride 06/07/2023 109  98 - 111 mmol/L Final   CO2 06/07/2023 21 (L)  22 - 32 mmol/L Final   Glucose, Bld 06/07/2023 125 (H)  70 - 99 mg/dL Final   Glucose reference range applies only to samples taken after fasting for at least 8 hours.    BUN 06/07/2023 14  8 - 23 mg/dL Final   Creatinine, Ser 06/07/2023 0.89  0.61 - 1.24 mg/dL Final   Calcium 96/29/5284 8.8 (L)  8.9 - 10.3 mg/dL Final   GFR, Estimated 06/07/2023 >60  >60 mL/min Final   Comment: (NOTE) Calculated using the CKD-EPI Creatinine Equation (2021)    Anion gap 06/07/2023 8  5 - 15 Final   Performed at [facility], [address] [place], [place] [address]    ECG: Date: 03/29/2023  Time ECG obtained: 1230 PM Rate: 72 bpm Rhythm: normal sinus Axis (leads I and aVF): normal Intervals: PR 150 ms. QRS 88 ms. QTc 427 ms. ST segment and T wave changes: No evidence of acute T wave abnormalities or significant ST segment elevation or depression.  Evidence of a possible, age undetermined, prior infarct:  Yes; anterior Comparison: Similar to previous tracing obtained on 06/24/2022  IMAGING /  PROCEDURES: MYOCARDIAL PERFUSION IMAGING STUDY (LEXISCAN) performed on 07/30/2022 Normal left ventricular systolic function with a normal LVEF of 63% Normal myocardial thickening and wall motion Left ventricular cavity size normal SPECT images demonstrate homogenous tracer distribution throughout the myocardium TID ratio = 0.91 No evidence of stress-induced myocardial ischemia or arrhythmia Normal low risk study  TRANSTHORACIC ECHOCARDIOGRAM performed on 07/23/2022 Normal left ventricular systolic function with an EF of >55% Normal left ventricular diastolic Doppler parameters Normal right ventricular systolic function Trivial MR, TR, PR Normal transvalvular gradients; no valvular stenosis No pericardial effusion  CT RENAL STONE STUDY performed on 07/22/2022 1.4 cm calculus within the left renal pelvis with associated mild dilatation and wall thickening of the left renal pelvis and mild surrounding inflammatory changes, likely intermittently obstructive with the patient erect. This calculus is visible on the scout image. Additional non obstructing bilateral  renal calculi. No evidence of ureteral calculus or focal perinephric fluid collection. Stable large ventral hernia containing multiple loops of small and large bowel. No evidence of incarceration or obstruction. Hepatic steatosis. Aortic atherosclerosis  LEFT HEART CATHETERIZATION AND CORONARY ANGIOGRAPHY performed on 05/11/2018 Normal left ventricular systolic function and EF Multivessel CAD Prox Cx lesion is 60% stenosed. Ost Cx to Prox Cx lesion is 30% stenosed. Ost 1st Diag lesion is 30% stenosed. Acute Mrg lesion is 75% stenosed. Dist RCA lesion is 30% stenosed. Recommendations Medical therapy with aggressive risk factor modification   CT CORONARY W/FRACTIONAL FLOW RESERVE FLUID ANALYSIS performed on 04/25/2018 Normal origin and course of coronary arteries  Scattered atherosclerotic disease with luminal narrowing greater than 50% in the mid LAD and the ostium of D1 and distal RCA. CADRAD 3  FFR analysis (ranges: < 0.75 high likelihood of hemodynamically significant stenosis, 0.76-0.80 borderline, > 0.80 normal): Left Main Artery:          Site of luminal narrowing:N/A          Distal vessel: N/A  Left Anterior Descending System:          Site of luminal narrowing: proximal LAD: 0.84, mid LAD: 0.78; D1: 0.74         Distal vessel: 0.62  Left Circumflex System:          Site of luminal narrowing: mid circ: 0.84          Distal vessel: 0.77  Right Coronary Artery system:          Site of luminal narrowing:distal RCA: .79          Distal vessel: 0.79   Impression and Plan:  Joe Dunlap has been referred for pre-anesthesia review and clearance prior to him undergoing the planned anesthetic and procedural courses. Available labs, pertinent testing, and imaging results were personally reviewed by me in preparation for upcoming operative/procedural course. [facility] medical record has been updated following extensive record review and patient interview with PAT staff.    ATTENTION --> PENDING CLEARANCE AT THIS TIME -- NOTE/CONTENTS NOT FINAL UNTIL SIGNED This patient has been appropriately cleared by cardiology with an overall *** risk of experiencing significant perioperative cardiovascular complications. Based on clinical review performed today (06/08/23), barring any significant acute changes in the patient's overall condition, it is anticipated that he will be able to proceed with the planned surgical intervention. Any acute changes in clinical condition may necessitate his procedure being postponed and/or cancelled. Patient will meet with anesthesia team (MD and/or CRNA) on the day of his procedure for preoperative evaluation/assessment. Questions regarding anesthetic course will be  fielded at that time.   Pre-surgical instructions were reviewed with the patient during his PAT appointment, and questions were fielded to satisfaction by PAT clinical staff. He has been instructed on which medications that he will need to hold prior to surgery, as well as the ones that have been deemed safe/appropriate to take on the day of his procedure. As part of the general education provided by PAT, patient made aware both verbally and in writing, that he would need to abstain from the use of any illegal substances during his perioperative course. He was advised that failure to follow the provided instructions could necessitate case cancellation or result in serious perioperative complications up to and including death. Patient encouraged to contact PAT and/or his surgeon's office to discuss any questions or concerns that may arise prior to surgery; verbalized understanding.   Quentin Mulling, MSN, APRN, FNP-C, CEN [facility]  Perioperative Services Nurse Practitioner Phone: [phone] Fax: [phone] 06/08/23 10:41 AM  NOTE: This note has been prepared using Dragon dictation software. Despite my best ability to proofread, there is always the potential  that unintentional transcriptional errors may still occur from this process.

## 2023-06-13 MED ORDER — CEFAZOLIN SODIUM-DEXTROSE 2-4 GM/100ML-% IV SOLN
2.0000 g | INTRAVENOUS | Status: AC
  Administered 2023-06-14: 2 g via INTRAVENOUS

## 2023-06-13 MED ORDER — ORAL CARE MOUTH RINSE: 15.0000 mL | Freq: Once | OROMUCOSAL | Status: AC

## 2023-06-13 MED ORDER — CHLORHEXIDINE GLUCONATE 0.12 % MT SOLN
15.0000 mL | Freq: Once | OROMUCOSAL | Status: AC
  Administered 2023-06-14: 15 mL via OROMUCOSAL

## 2023-06-13 MED ORDER — LACTATED RINGERS IV SOLN: INTRAVENOUS | Status: DC

## 2023-06-14 ENCOUNTER — Ambulatory Visit
Admission: RE | Admit: 2023-06-14 | Discharge: 2023-06-14 | Disposition: A | Discharge status: Home / Self Care | Payer: BC Managed Care – PPO | Attending: Urology | Admitting: Urology

## 2023-06-14 ENCOUNTER — Encounter: Payer: Self-pay | Admitting: Urology

## 2023-06-14 ENCOUNTER — Encounter
Admission: RE | Disposition: A | Discharge status: Home / Self Care | Payer: Self-pay | Source: Home / Self Care | Attending: Urology

## 2023-06-14 ENCOUNTER — Ambulatory Visit: Payer: Self-pay | Admitting: Urgent Care

## 2023-06-14 ENCOUNTER — Other Ambulatory Visit: Payer: Self-pay

## 2023-06-14 DIAGNOSIS — K219 Gastro-esophageal reflux disease without esophagitis: Secondary | ICD-10-CM | POA: Diagnosis not present

## 2023-06-14 DIAGNOSIS — I2510 Atherosclerotic heart disease of native coronary artery without angina pectoris: Secondary | ICD-10-CM | POA: Diagnosis not present

## 2023-06-14 DIAGNOSIS — N138 Other obstructive and reflux uropathy: Secondary | ICD-10-CM | POA: Insufficient documentation

## 2023-06-14 DIAGNOSIS — Z6830 Body mass index (BMI) 30.0-30.9, adult: Secondary | ICD-10-CM | POA: Insufficient documentation

## 2023-06-14 DIAGNOSIS — K279 Peptic ulcer, site unspecified, unspecified as acute or chronic, without hemorrhage or perforation: Secondary | ICD-10-CM | POA: Insufficient documentation

## 2023-06-14 DIAGNOSIS — N401 Enlarged prostate with lower urinary tract symptoms: Secondary | ICD-10-CM | POA: Diagnosis not present

## 2023-06-14 DIAGNOSIS — E669 Obesity, unspecified: Secondary | ICD-10-CM | POA: Diagnosis not present

## 2023-06-14 DIAGNOSIS — I10 Essential (primary) hypertension: Secondary | ICD-10-CM | POA: Insufficient documentation

## 2023-06-14 DIAGNOSIS — R519 Headache, unspecified: Secondary | ICD-10-CM | POA: Diagnosis not present

## 2023-06-14 DIAGNOSIS — G4733 Obstructive sleep apnea (adult) (pediatric): Secondary | ICD-10-CM | POA: Diagnosis not present

## 2023-06-14 DIAGNOSIS — Z87442 Personal history of urinary calculi: Secondary | ICD-10-CM | POA: Insufficient documentation

## 2023-06-14 HISTORY — PX: TRANSURETHRAL RESECTION OF PROSTATE: SHX73

## 2023-06-14 HISTORY — DX: Obstructive sleep apnea (adult) (pediatric): G47.33

## 2023-06-14 HISTORY — DX: Fatty (change of) liver, not elsewhere classified: K76.0

## 2023-06-14 HISTORY — DX: Ventral hernia without obstruction or gangrene: K43.9

## 2023-06-14 HISTORY — DX: Disorder of arteries and arterioles, unspecified: I77.9

## 2023-06-14 HISTORY — DX: Atherosclerosis of aorta: I70.0

## 2023-06-14 SURGERY — TURP (TRANSURETHRAL RESECTION OF PROSTATE)
Anesthesia: General | Site: Prostate

## 2023-06-14 MED ORDER — FENTANYL CITRATE (PF) 100 MCG/2ML IJ SOLN
INTRAMUSCULAR | Status: DC | PRN
Start: 2023-06-14 — End: 2023-06-14
  Administered 2023-06-14: 50 ug via INTRAVENOUS
  Administered 2023-06-14 (×2): 25 ug via INTRAVENOUS

## 2023-06-14 MED ORDER — HYDROCODONE-ACETAMINOPHEN 5-325 MG PO TABS: 1.0000 | ORAL_TABLET | Freq: Four times a day (QID) | ORAL | 0 refills | Status: DC | PRN

## 2023-06-14 MED ORDER — FENTANYL CITRATE (PF) 100 MCG/2ML IJ SOLN: 25.0000 ug | INTRAMUSCULAR | Status: DC | PRN

## 2023-06-14 MED ORDER — LACTATED RINGERS IV SOLN: INTRAVENOUS | Status: DC

## 2023-06-14 MED ORDER — CEFAZOLIN SODIUM-DEXTROSE 2-4 GM/100ML-% IV SOLN
INTRAVENOUS | Status: AC
  Filled 2023-06-14: qty 100

## 2023-06-14 MED ORDER — PROPOFOL 10 MG/ML IV BOLUS
INTRAVENOUS | Status: DC | PRN
Start: 2023-06-14 — End: 2023-06-14
  Administered 2023-06-14: 150 mg via INTRAVENOUS
  Administered 2023-06-14: 50 mg via INTRAVENOUS

## 2023-06-14 MED ORDER — FENTANYL CITRATE (PF) 100 MCG/2ML IJ SOLN
INTRAMUSCULAR | Status: AC
Start: 2023-06-14 — End: ?
  Filled 2023-06-14: qty 2

## 2023-06-14 MED ORDER — ACETAMINOPHEN 10 MG/ML IV SOLN: 1000.0000 mg | Freq: Once | INTRAVENOUS | Status: DC | PRN

## 2023-06-14 MED ORDER — OXYCODONE HCL 5 MG PO TABS
ORAL_TABLET | ORAL | Status: AC
  Filled 2023-06-14: qty 1

## 2023-06-14 MED ORDER — SODIUM CHLORIDE 0.9 % IR SOLN
Status: DC | PRN
  Administered 2023-06-14 (×3): 6000 mL

## 2023-06-14 MED ORDER — OXYCODONE HCL 5 MG PO TABS
5.0000 mg | ORAL_TABLET | Freq: Once | ORAL | Status: AC | PRN
  Administered 2023-06-14: 5 mg via ORAL

## 2023-06-14 MED ORDER — PHENYLEPHRINE HCL (PRESSORS) 10 MG/ML IV SOLN
INTRAVENOUS | Status: DC | PRN
  Administered 2023-06-14: 120 ug via INTRAVENOUS
  Administered 2023-06-14: 160 ug via INTRAVENOUS
  Administered 2023-06-14: 120 ug via INTRAVENOUS

## 2023-06-14 MED ORDER — MIDAZOLAM HCL 2 MG/2ML IJ SOLN
INTRAMUSCULAR | Status: DC | PRN
Start: 2023-06-14 — End: 2023-06-14
  Administered 2023-06-14: 2 mg via INTRAVENOUS

## 2023-06-14 MED ORDER — LIDOCAINE HCL (CARDIAC) PF 100 MG/5ML IV SOSY
PREFILLED_SYRINGE | INTRAVENOUS | Status: DC | PRN
  Administered 2023-06-14: 100 mg via INTRAVENOUS

## 2023-06-14 MED ORDER — LIDOCAINE HCL (PF) 2 % IJ SOLN
INTRAMUSCULAR | Status: AC
  Filled 2023-06-14: qty 5

## 2023-06-14 MED ORDER — ACETAMINOPHEN 10 MG/ML IV SOLN
INTRAVENOUS | Status: AC
  Filled 2023-06-14: qty 100

## 2023-06-14 MED ORDER — MIDAZOLAM HCL 2 MG/2ML IJ SOLN
INTRAMUSCULAR | Status: AC
  Filled 2023-06-14: qty 2

## 2023-06-14 MED ORDER — PROPOFOL 10 MG/ML IV BOLUS
INTRAVENOUS | Status: AC
  Filled 2023-06-14: qty 20

## 2023-06-14 MED ORDER — PHENYLEPHRINE 80 MCG/ML (10ML) SYRINGE FOR IV PUSH (FOR BLOOD PRESSURE SUPPORT)
PREFILLED_SYRINGE | INTRAVENOUS | Status: AC
  Filled 2023-06-14: qty 10

## 2023-06-14 MED ORDER — ONDANSETRON HCL 4 MG/2ML IJ SOLN: 4.0000 mg | Freq: Once | INTRAMUSCULAR | Status: DC | PRN

## 2023-06-14 MED ORDER — ONDANSETRON HCL 4 MG/2ML IJ SOLN
INTRAMUSCULAR | Status: DC | PRN
  Administered 2023-06-14: 4 mg via INTRAVENOUS

## 2023-06-14 MED ORDER — OXYBUTYNIN CHLORIDE 5 MG PO TABS: 5.0000 mg | ORAL_TABLET | Freq: Three times a day (TID) | ORAL | 0 refills | Status: DC | PRN

## 2023-06-14 MED ORDER — ACETAMINOPHEN 10 MG/ML IV SOLN
INTRAVENOUS | Status: DC | PRN
  Administered 2023-06-14: 1000 mg via INTRAVENOUS

## 2023-06-14 MED ORDER — CHLORHEXIDINE GLUCONATE 0.12 % MT SOLN
OROMUCOSAL | Status: AC
  Filled 2023-06-14: qty 15

## 2023-06-14 MED ORDER — DEXAMETHASONE SODIUM PHOSPHATE 10 MG/ML IJ SOLN
INTRAMUSCULAR | Status: DC | PRN
  Administered 2023-06-14: 5 mg via INTRAVENOUS

## 2023-06-14 MED ORDER — OXYCODONE HCL 5 MG/5ML PO SOLN: 5.0000 mg | Freq: Once | ORAL | Status: AC | PRN

## 2023-06-14 SURGICAL SUPPLY — 21 items
ADAPTER IRRIG TUBE 2 SPIKE SOL (ADAPTER) ×2 IMPLANT
BAG DRAIN SIEMENS DORNER NS (MISCELLANEOUS) ×1 IMPLANT
BAG URO DRAIN 4000ML (MISCELLANEOUS) ×1 IMPLANT
CATH FOL 2WAY LX 24X30 (CATHETERS) IMPLANT
CATH FOLEY 2W COUNCIL 20FR 5CC (CATHETERS) IMPLANT
DRAPE UTILITY 15X26 TOWEL STRL (DRAPES) ×1 IMPLANT
ELECT LOOP 22F BIPOLAR SML (ELECTROSURGICAL) IMPLANT
ELECTRODE LOOP 22F BIPOLAR SML (ELECTROSURGICAL) IMPLANT
GLOVE BIO SURGEON STRL SZ 6.5 (GLOVE) ×1 IMPLANT
GOWN STRL REUS W/ TWL LRG LVL3 (GOWN DISPOSABLE) ×2 IMPLANT
HOLDER FOLEY CATH W/STRAP (MISCELLANEOUS) ×1 IMPLANT
KIT TURNOVER CYSTO (KITS) ×1 IMPLANT
LOOP CUT BIPOLAR 24F LRG (ELECTROSURGICAL) IMPLANT
PACK CYSTO AR (MISCELLANEOUS) ×1 IMPLANT
SET IRRIG Y TYPE TUR BLADDER L (SET/KITS/TRAYS/PACK) ×1 IMPLANT
SOL .9 NS 3000ML IRR UROMATIC (IV SOLUTION) ×6 IMPLANT
SURGILUBE 2OZ TUBE FLIPTOP (MISCELLANEOUS) ×1 IMPLANT
SYR TOOMEY IRRIG 70ML (MISCELLANEOUS) ×1 IMPLANT
SYRINGE TOOMEY IRRIG 70ML (MISCELLANEOUS) ×1 IMPLANT
WATER STERILE IRR 1000ML POUR (IV SOLUTION) ×1 IMPLANT
WATER STERILE IRR 500ML POUR (IV SOLUTION) ×1 IMPLANT

## 2023-06-14 NOTE — Transfer of Care (Signed)
 Immediate Anesthesia Transfer of Care Note  Patient: Joe Dunlap  Procedure(s) Performed: TURP (TRANSURETHRAL RESECTION OF PROSTATE) (Prostate)  Patient Location: PACU  Anesthesia Type:General  Level of Consciousness: sedated  Airway & Oxygen Therapy: Patient Spontanous Breathing and Patient connected to face mask oxygen  Post-op Assessment: Report given to RN, Post -op Vital signs reviewed and stable, and Patient moving all extremities  Post vital signs: Reviewed and stable  Last Vitals:  Vitals Value Taken Time  BP 143/72 06/14/23 1316  Temp 36.1 C 06/14/23 1315  Pulse 86 06/14/23 1318  Resp 18 06/14/23 1318  SpO2 100 % 06/14/23 1318  Vitals shown include unfiled device data.  Last Pain:  Vitals:   06/14/23 0901  TempSrc: Temporal  PainSc: 0-No pain         Complications: There were no known notable events for this encounter.

## 2023-06-14 NOTE — Anesthesia Preprocedure Evaluation (Addendum)
 Anesthesia Evaluation  Patient identified by MRN, date of birth, ID band Patient awake    Reviewed: Allergy & Precautions, NPO status , Patient's Chart, lab work & pertinent test results  History of Anesthesia Complications Negative for: history of anesthetic complications  Airway Mallampati: III   Neck ROM: Full    Dental no notable dental hx.    Pulmonary sleep apnea and Continuous Positive Airway Pressure Ventilation    Pulmonary exam normal breath sounds clear to auscultation       Cardiovascular hypertension, + CAD  Normal cardiovascular exam Rhythm:Regular Rate:Normal  ECG 03/29/23: NSR, HR 72 bpm, no acute ST-T wave changes; no significant changes since last EKG  Myocardial perfusion 07/30/22: Normal myocardial perfusion scan no evidence of stress-induced myocardial ischemia ejection fraction of 63% conclusion negative scan this is a lower study   ECHO 07/23/22:  1. Normal left ventricular systolic function with an EF of >55% 2. Normal left ventricular diastolic Doppler parameters 3. Normal right ventricular systolic function 4. Trivial MR, TR, PR 5. Normal transvalvular gradients; no valvular stenosis 6. No pericardial effusion    Neuro/Psych  Headaches    GI/Hepatic PUD,GERD  ,,  Endo/Other  Obesity   Renal/GU Renal disease (CKD; nephrolithiasis)     Musculoskeletal   Abdominal   Peds  Hematology negative hematology ROS (+)   Anesthesia Other Findings Reviewed and agree with Edd Fabian pre-anesthesia clinical review note.    Cardiology note 03/29/23:  Patient with chronic chest pain since 06/24/22 visit, that worsens when at work a/w stress. Pain is persistent but not worsening. Pt with h/o non-obstructive CAD by LHC in 2020. No evidence of stress induced myocardial ischemia on ETT Myoview 07/30/22. ECHO 07/23/22 with preserved function and no significant valvular disease. Continue medical management with  isosorbide, metoprolol, pravastatin. No aspirin with prior GI ulcer. CCTA if s/s worsening.  BP at goal. Continue current management.  Continue pravastatin - last LDL close to goal of < 70 mg/dL. Will continue to monitor.  Continue CPAP.   Return in about 2 months (around 05/27/2023).    Reproductive/Obstetrics                             Anesthesia Physical Anesthesia Plan  ASA: 3  Anesthesia Plan: General   Post-op Pain Management:    Induction: Intravenous  PONV Risk Score and Plan: 2 and Ondansetron, Dexamethasone and Treatment may vary due to age or medical condition  Airway Management Planned: LMA  Additional Equipment:   Intra-op Plan:   Post-operative Plan: Extubation in OR  Informed Consent: I have reviewed the patients History and Physical, chart, labs and discussed the procedure including the risks, benefits and alternatives for the proposed anesthesia with the patient or authorized representative who has indicated his/her understanding and acceptance.     Dental advisory given  Plan Discussed with: CRNA  Anesthesia Plan Comments: (Patient consented for risks of anesthesia including but not limited to:  - adverse reactions to medications - damage to eyes, teeth, lips or other oral mucosa - nerve damage due to positioning  - sore throat or hoarseness - damage to heart, brain, nerves, lungs, other parts of body or loss of life  Informed patient about role of CRNA in peri- and intra-operative care.  Patient voiced understanding.)        Anesthesia Quick Evaluation

## 2023-06-14 NOTE — Anesthesia Postprocedure Evaluation (Signed)
 Anesthesia Post Note  Patient: [name] Amberg  Procedure(s) Performed: TURP (TRANSURETHRAL RESECTION OF PROSTATE) (Prostate)  Patient location during evaluation: PACU Anesthesia Type: General Level of consciousness: awake and alert, oriented and patient cooperative Pain management: pain level controlled Vital Signs Assessment: post-procedure vital signs reviewed and stable Respiratory status: spontaneous breathing, nonlabored ventilation and respiratory function stable Cardiovascular status: blood pressure returned to baseline and stable Postop Assessment: adequate PO intake Anesthetic complications: no   There were no known notable events for this encounter.   Last Vitals:  Vitals:   06/14/23 1315 06/14/23 1330  BP: (!) 143/72 119/81  Pulse: 84 66  Resp: 13 15  Temp: (!) 36.1 C   SpO2: 98% 98%    Last Pain:  Vitals:   06/14/23 1330  TempSrc:   PainSc: 3                  Dorothey Gate

## 2023-06-14 NOTE — Op Note (Signed)
 Date of procedure: 06/14/23  Preoperative diagnosis:  BPH with obstruction  Postoperative diagnosis:  Same as above  Procedure: TURP  Surgeon: Dustin Gimenez, MD  Anesthesia: General  Complications: None  Intraoperative findings: Somewhat elevated bladder neck with bilobar coaptation.  Trabeculated bladder.  Previous UroLift clips unroofed and removed.  Hemostasis excellent.  EBL: Minimal  Specimens: None  Drains: 20 French Foley catheter with 50 cc balloon  Indication: Joe Dunlap is a 63 y.o. patient with refractory obstructive and irritative urinary symptoms found to be obstructed persistently on urodynamics despite UroLift x 2.  He elected for TURP.Aaron Aas  After reviewing the management options for treatment, he elected to proceed with the above surgical procedure(s). We have discussed the potential benefits and risks of the procedure, side effects of the proposed treatment, the likelihood of the patient achieving the goals of the procedure, and any potential problems that might occur during the procedure or recuperation. Informed consent has been obtained.  Description of procedure:  The patient was taken to the operating room and general anesthesia was induced.  The patient was placed in the dorsal lithotomy position, prepped and draped in the usual sterile fashion, and preoperative antibiotics were administered. A preoperative time-out was performed.   Male sounds were used to calibrate the urethra.  A 26 French resectoscope was advanced into the prostate using a blunt angled operator however was unable to advance it all the way into the bladder due to an elevated bladder neck.  I put in the visual obturator and with some resistance and repositioning was ultimately able to get the scope into the bladder.  This point in time, using saline is a medium in a bipolar loop, the bladder neck was taken down.  This made mobility almost immediately easier.  I then addressed each of the  lateral lobes with care taken not to avoid any resection beyond the verumontanum.  Upon addressing each of these anterior lateral lobes, I did come across multiple UroLift clips which were able to be removed by transecting the UroLift suture with the bipolar loop.  Ultimately, I was able to take down the majority of the prostate to the level of the capsule.  UroLift clips and prostate chips were irrigated free from the bladder.  I then fulgurated almost the entirety of the prostatic fossa such that excellent hemostasis was achieved.  I rechecked both the prostate fossa as well as the bladder 1 more time to ensure that all chips and clips were removed.  The scope was then removed.  A 20 French Foley catheter was placed easily and the balloon was filled with 50 cc of saline.  The urine was clear.  Patient was then cleaned and dried, repositioned in the supine position, resume anesthesia, taken to the PACU in stable condition.  Plan: Patient will follow-up for foley in 2 days.   Dustin Gimenez, M.D.

## 2023-06-14 NOTE — Discharge Instructions (Signed)

## 2023-06-14 NOTE — H&P (Signed)
 06/14/23 \ RRR CTAB  Axcel Horsch [facility] [birth year] [id number]   Referring provider: Marina Goodell, MD [address] [place],  [place] [address]      Chief Complaint  Patient presents with   Consult      HPI: 63 year-old male who presents today for further evaluation of refractory urinary symptoms.    He has undergone UroLift x2. He did have urodynamics that did show he in fact had an obstruction from his prostate. Both of his results from his UroLift have been transient.    He returns today to discuss the next steps in resolving his urinary issues.    Please see numerous previous notes for details.     PMH:     Past Medical History:  Diagnosis Date   Coronary artery disease     DDD (degenerative disc disease), lumbar     GERD (gastroesophageal reflux disease)     Headache      not intractable   History of kidney stones     Hyperlipemia     Hypertension     Impaired glucose tolerance     Kidney stones 2018   Labral tear of hip, degenerative     Lumbar radiculitis     Obesity (BMI 30-39.9)     Shingles      left T6   Sleep apnea      USES CPAP   Stomach ulcer            Surgical History:      Past Surgical History:  Procedure Laterality Date   COLONOSCOPY WITH PROPOFOL N/A 02/11/2018    Procedure: COLONOSCOPY WITH PROPOFOL;  Surgeon: Christena Deem, MD;  Location: [facility] ENDOSCOPY;  Service: Endoscopy;  Laterality: N/A;   CYSTOSCOPY WITH INSERTION OF UROLIFT N/A 08/14/2019    Procedure: CYSTOSCOPY WITH INSERTION OF UROLIFT;  Surgeon: [name], MD;  Location: [facility] ORS;  Service: Urology;  Laterality: N/A;   CYSTOSCOPY WITH INSERTION OF UROLIFT N/A 09/08/2021    Procedure: CYSTOSCOPY WITH INSERTION OF UROLIFT;  Surgeon: [name], MD;  Location: [facility] ORS;  Service: Urology;  Laterality: N/A;   EXTRACORPOREAL SHOCK WAVE LITHOTRIPSY Right 06/25/2016    Procedure: EXTRACORPOREAL SHOCK WAVE LITHOTRIPSY (ESWL);  Surgeon: [name], MD;  Location:  [facility] ORS;  Service: Urology;  Laterality: Right;   EXTRACORPOREAL SHOCK WAVE LITHOTRIPSY Right 08/13/2022    Procedure: EXTRACORPOREAL SHOCK WAVE LITHOTRIPSY (ESWL);  Surgeon: Sondra Come, MD;  Location: [facility] ORS;  Service: Urology;  Laterality: Right;   GASTRIC ULCER REPAIR       KNEE SURGERY Left     LEFT HEART CATH AND CORONARY ANGIOGRAPHY Left 05/11/2018    Procedure: LEFT HEART CATH AND CORONARY ANGIOGRAPHY;  Surgeon: Marcina Millard, MD;  Location: [facility] INVASIVE CV LAB;  Service: Cardiovascular;  Laterality: Left;   TONSILLECTOMY       UPPER GI ENDOSCOPY   2008    stomach ulcer   UVULOPALATOPHARYNGOPLASTY (UPPP)/TONSILLECTOMY/SEPTOPLASTY   2000          Home Medications:  Allergies as of 12/25/2022         Reactions    Nsaids Other (See Comments)    Hx of ulcer            Medication List           Accurate as of December 25, 2022 11:17 AM. If you have any questions, ask your nurse or doctor.  amLODipine 10 MG tablet Commonly known as: NORVASC Take 10 mg by mouth daily.    cyclobenzaprine 10 MG tablet Commonly known as: FLEXERIL Take 5-10 mg by mouth 2 (two) times daily as needed for muscle spasms.    isosorbide mononitrate 30 MG 24 hr tablet Commonly known as: IMDUR Take 30 mg by mouth daily.    metoprolol succinate 25 MG 24 hr tablet Commonly known as: TOPROL-XL Take 12.5 mg by mouth daily.    pravastatin 40 MG tablet Commonly known as: PRAVACHOL Take 40 mg by mouth at bedtime.             Allergies:  Allergies       Allergies  Allergen Reactions   Nsaids Other (See Comments)      Hx of ulcer        Family History:      Family History  Problem Relation Age of Onset   Heart attack Father     Prostatitis Neg Hx     Prostate cancer Neg Hx     Kidney cancer Neg Hx     Bladder Cancer Neg Hx            Social History:  reports that he has never smoked. He has never used smokeless tobacco. He reports current alcohol  use of about 5.0 standard drinks of alcohol per week. He reports that he does not use drugs.     Physical Exam: BP 134/79 (BP Location: Left Arm, Patient Position: Sitting, Cuff Size: Large)   Pulse 81   Ht 5\' 10"  (1.778 m)   Wt 249 lb (112.9 kg)   BMI 35.73 kg/m   Constitutional:  Alert and oriented, No acute distress. HEENT: [place] AT, moist mucus membranes.  Trachea midline, no masses. Neurologic: Grossly intact, no focal deficits, moving all 4 extremities. Psychiatric: Normal mood and affect.     Assessment & Plan:     1. BPH - Status post UroLift x2 with urinary symptom recurrence shortly after procedure - Discussed the option of TURP as a more definitive treatment for BPH.  - Explained the procedure, potential benefits, and risks, including retrograde ejaculation, bleeding, and possible need for overnight hospitalization. - He is considering TURP and is aware of the potential outcomes and risks; unable to definitively guarantee that I will address his bothersome urinary symptoms which are primarily irritative in nature.  He understands and is motivated to proceed. - Surgery tentatively planned for late November or December. - Instructed patient on catheter use post-operatively, including how to manage and empty the catheter bag. - Pre-op urinalysis closer to procedure date to screen for infection   Return for TURP.   .I have reviewed the above documentation for accuracy and completeness, and I agree with the above.    Dustin Gimenez, MD       [facility] [address] [place], [place] [address] [phone]

## 2023-06-15 ENCOUNTER — Other Ambulatory Visit: Payer: Self-pay | Admitting: Urology

## 2023-06-15 ENCOUNTER — Encounter: Payer: Self-pay | Admitting: Urology

## 2023-06-15 LAB — SURGICAL PATHOLOGY

## 2023-06-16 ENCOUNTER — Encounter: Payer: Self-pay | Admitting: Physician Assistant

## 2023-06-16 ENCOUNTER — Ambulatory Visit: Payer: Self-pay | Admitting: Physician Assistant

## 2023-06-16 VITALS — BP 139/83 | HR 68 | Ht 70.0 in | Wt 252.0 lb

## 2023-06-16 DIAGNOSIS — N138 Other obstructive and reflux uropathy: Secondary | ICD-10-CM

## 2023-06-16 DIAGNOSIS — N401 Enlarged prostate with lower urinary tract symptoms: Secondary | ICD-10-CM

## 2023-06-16 NOTE — Progress Notes (Signed)
 Catheter Removal  Patient is present today for a catheter removal.  50ml of water was drained from the balloon. A 20FR foley cath was removed from the bladder, no complications were noted. Patient tolerated well.  Performed by: Wisdom Seybold, PA-C   Additional notes: Counseled patient on normal postoperative findings including dysuria, gross hematuria, and urinary urgency/leakage. Counseled patient to begin Kegel exercises 3x10 sets daily to increase urinary control and wear absorbent products as needed for security. Written and verbal resources provided today. Surgical pathology benign, results discussed.   Follow up/ Additional notes: Return in about 6 weeks (around 07/28/2023) for Postop f/u with Dr. Ace Holder.

## 2023-06-16 NOTE — Patient Instructions (Signed)
Congratulations on your recent TURP procedure! As discussed in clinic today, these are the three normal postoperative findings after this surgery: Burning or pain with urination: This typically resolves within 1 week of surgery. If you are still having significant pain with urination 10 days after surgery, please call our clinic. We may need to check you for a urinary tract infection at that point, though this is rare. Blood in the urine: This may either come and go or steadily improve before going away, but typically resolves completely within 3 weeks of surgery. If you are on blood thinners, it may take longer for the bleeding to resolve. Please note that you may find that you pass clumps of tissue or debris around 10-14 days after surgery associated with some new bleeding. This is due to sloughing of your postoperative scab and is also normal. If at any point you start to pass dark red urine; thick, ketchup-like urine; or large blood clots around the size of your palm, please call our office immediately. Urinary leakage or urgency: This tends to improve with time, with most patients becoming dry within around 3 months of surgery. You may wear absorbant underwear or liners for security during this time. To help you get dry faster, please make sure you are completing your Kegel exercises as instructed, with a set of 10 exercises completed up to three times daily. Further information on Kegel exercises can be found in the back of this packet.  

## 2023-06-17 ENCOUNTER — Other Ambulatory Visit: Payer: Self-pay

## 2023-06-17 ENCOUNTER — Encounter: Payer: Self-pay | Admitting: Emergency Medicine

## 2023-06-17 ENCOUNTER — Emergency Department
Admission: EM | Admit: 2023-06-17 | Discharge: 2023-06-17 | Disposition: A | Discharge status: Home / Self Care | Attending: Emergency Medicine | Admitting: Emergency Medicine

## 2023-06-17 DIAGNOSIS — R339 Retention of urine, unspecified: Secondary | ICD-10-CM | POA: Diagnosis present

## 2023-06-17 DIAGNOSIS — R1030 Lower abdominal pain, unspecified: Secondary | ICD-10-CM | POA: Insufficient documentation

## 2023-06-17 DIAGNOSIS — N138 Other obstructive and reflux uropathy: Secondary | ICD-10-CM

## 2023-06-17 LAB — URINALYSIS, W/ REFLEX TO CULTURE (INFECTION SUSPECTED)
Bilirubin Urine: NEGATIVE
Glucose, UA: NEGATIVE mg/dL
Ketones, ur: NEGATIVE mg/dL
Nitrite: NEGATIVE
Protein, ur: 100 mg/dL — AB
RBC / HPF: >50 RBC/hpf (ref 0–5)
Specific Gravity, Urine: 1.015 (ref 1.005–1.030)
Squamous Epithelial / HPF: 0 /HPF (ref 0–5)
pH: 6 (ref 5.0–8.0)

## 2023-06-17 MED ORDER — LIDOCAINE HCL URETHRAL/MUCOSAL 2 % EX GEL
1.0000 | Freq: Once | CUTANEOUS | Status: AC
  Administered 2023-06-17: 1 via URETHRAL
  Filled 2023-06-17: qty 6

## 2023-06-17 MED ORDER — HYDROCODONE-ACETAMINOPHEN 5-325 MG PO TABS
1.0000 | ORAL_TABLET | Freq: Once | ORAL | Status: AC
  Administered 2023-06-17: 1 via ORAL
  Filled 2023-06-17: qty 1

## 2023-06-17 MED ORDER — OXYBUTYNIN CHLORIDE 5 MG PO TABS: 5.0000 mg | ORAL_TABLET | Freq: Three times a day (TID) | ORAL | 0 refills | Status: DC | PRN

## 2023-06-17 MED ORDER — HYDROCODONE-ACETAMINOPHEN 5-325 MG PO TABS: 1.0000 | ORAL_TABLET | Freq: Four times a day (QID) | ORAL | 0 refills | Status: DC | PRN

## 2023-06-17 NOTE — ED Triage Notes (Signed)
 Presents with urinary retention   States had a foley cath removed yesterday  Was able to void last pm

## 2023-06-17 NOTE — Discharge Instructions (Addendum)
 You are seen in the ER today for your difficulty peeing.  We placed a catheter.  Please keep this in place until you follow-up with urology.  Return to the ER for any new or worsening symptoms.

## 2023-06-17 NOTE — ED Provider Notes (Signed)
 [facility] Provider Note    Event Date/Time   First MD Initiated Contact with Patient 06/17/23 [phone]     (approximate)   History   Urinary Retention   HPI  Joe Dunlap is a 63 year old male with history of BPH with prior UroLift x 2, recent TURP on 06/14/2023 presenting to the emergency department for evaluation of urinary retention.  Had an outpatient visit with urology yesterday at which time his catheter was removed.  Reports he voided spontaneously throughout the day yesterday.  Last void around 1 AM.  This morning, is attempted to void multiple times but is unable to do so.  Reports lower abdominal discomfort secondary to this leading him to present to the ER.     Physical Exam   Triage Vital Signs: ED Triage Vitals  Encounter Vitals Group     BP 06/17/23 0914 (!) 159/75     Systolic BP Percentile --      Diastolic BP Percentile --      Pulse Rate 06/17/23 0914 (!) 114     Resp 06/17/23 0914 16     Temp 06/17/23 0914 98.7 F (37.1 C)     Temp Source 06/17/23 0914 Oral     SpO2 06/17/23 0914 99 %     Weight 06/17/23 0915 251 lb 15.8 oz (114.3 kg)     Height 06/17/23 0915 5\' 10"  (1.778 m)     Head Circumference --      Peak Flow --      Pain Score 06/17/23 0923 8     Pain Loc --      Pain Education --      Exclude from Growth Chart --     Most recent vital signs: Vitals:   06/17/23 0914  BP: (!) 159/75  Pulse: (!) 114  Resp: 16  Temp: 98.7 F (37.1 C)  SpO2: 99%     General: Awake, interactive  CV:  Tachycardic, good peripheral perfusion.  Resp:  Unlabored respirations.  Abd:  Nondistended, soft, readily reducible periumbilical hernia, mild lower abdominal discomfort Neuro:  Symmetric facial movement, fluid speech   ED Results / Procedures / Treatments   Labs (all labs ordered are listed, but only abnormal results are displayed) Labs Reviewed  URINALYSIS, W/ REFLEX TO CULTURE (INFECTION SUSPECTED) - Abnormal; Notable  for the following components:      Result Value   Color, Urine YELLOW (*)    APPearance CLOUDY (*)    Hgb urine dipstick LARGE (*)    Protein, ur 100 (*)    Leukocytes,Ua TRACE (*)    Bacteria, UA RARE (*)    All other components within normal limits  URINE CULTURE     EKG EKG independently reviewed interpreted by myself (ER attending) demonstrates:    RADIOLOGY Imaging independently reviewed and interpreted by myself demonstrates:   Formal Radiology Read:  No results found.  PROCEDURES:  Critical Care performed: No  Procedures   MEDICATIONS ORDERED IN ED: Medications  HYDROcodone-acetaminophen (NORCO/VICODIN) 5-325 MG per tablet 1 tablet (1 tablet Oral Given 06/17/23 0937)  lidocaine (XYLOCAINE) 2 % jelly 1 Application (1 Application Urethral Given by Other 06/17/23 1007)     IMPRESSION / MDM / ASSESSMENT AND PLAN / ED COURSE  I reviewed the triage vital signs and the nursing notes.  Differential diagnosis includes, but is not limited to, urinary retention secondary to recent anesthesia, obstruction from blood clot, swelling, UTI   Patient's presentation is most consistent  with acute complicated illness / injury requiring diagnostic workup.  63 year old male presenting with urinary retention in setting of recent TURP.  Bladder scan with 540cc urine. Tachycardic in the setting of acute pain.  Case was reviewed with Dr. Estanislao Heimlich with on-call urology.  He did recommend placement of a 20 [place] coud catheter. If successful, did feel patient could be discharged. Did not feel that labs or imaging were currently indicated. Recommended sending message to Dr. Ace Holder to facilitate follow-up.  10:59 AM Catheter was successfully able to be placed with return of urine.  Patient reports feeling immediately improved.  No new complaints on reevaluation.  Discussed plan for discharge with catheter in place and follow-up with urology.  Patient is comfortable with this plan.  No focal  urine that I suspect is likely related to presence of blood.  Patient without infectious symptoms, will send culture but hold off on antibiotics.  Message sent to Dr. Ace Holder.  Strict return precautions provided.  Patient discharged in stable condition.     FINAL CLINICAL IMPRESSION(S) / ED DIAGNOSES   Final diagnoses:  Urinary retention     Rx / DC Orders   ED Discharge Orders     None        Note:  This document was prepared using Dragon voice recognition software and may include unintentional dictation errors.   Claria Crofts, MD 06/17/23 1059

## 2023-06-18 ENCOUNTER — Other Ambulatory Visit: Payer: Self-pay | Admitting: Physician Assistant

## 2023-06-18 DIAGNOSIS — N138 Other obstructive and reflux uropathy: Secondary | ICD-10-CM

## 2023-06-19 ENCOUNTER — Other Ambulatory Visit: Payer: Self-pay | Admitting: Physician Assistant

## 2023-06-19 DIAGNOSIS — N138 Other obstructive and reflux uropathy: Secondary | ICD-10-CM

## 2023-06-19 LAB — URINE CULTURE: Culture: >=100000 — AB

## 2023-06-20 ENCOUNTER — Other Ambulatory Visit: Payer: Self-pay | Admitting: Physician Assistant

## 2023-06-20 DIAGNOSIS — N401 Enlarged prostate with lower urinary tract symptoms: Secondary | ICD-10-CM

## 2023-06-20 NOTE — Progress Notes (Addendum)
 ED Antimicrobial Stewardship Positive Culture Follow Up   Joe Dunlap is an 63 y.o. male who presented to [facility] on (Not on file) with a chief complaint of No chief complaint on file.  Recent Results (from the past 720 hours)  Urine Culture     Status: Abnormal   Collection Time: 06/17/23 10:16 AM   Specimen: Urine, Random  Result Value Ref Range Status   Specimen Description   Final    URINE, RANDOM Performed at [facility], [address] [place], [place] [address]    Special Requests   Final    NONE Reflexed from [phone] [id number] Performed at [facility], [address] [place], [place] [address]    Culture >=100,000 COLONIES/mL ENTEROCOCCUS FAECALIS (A)  Final   Report Status 06/19/2023 FINAL  Final   Organism ID, Bacteria ENTEROCOCCUS FAECALIS (A)  Final      Susceptibility   Enterococcus faecalis - MIC*    AMPICILLIN  <=2 SENSITIVE Sensitive     NITROFURANTOIN  <=16 SENSITIVE Sensitive     VANCOMYCIN 1 SENSITIVE Sensitive     * >=100,000 COLONIES/mL ENTEROCOCCUS FAECALIS   [x]  Patient discharged originally without antimicrobial agent and treatment is now indicated  New antibiotic prescription: Amoxicillin 875 mg BID x 5 days   ED Provider: Dr. Demetrios Finders   Confirmed antibiotic plan with patient and script called in to [facility] in [place].   Pansy Bogus, PharmD Pharmacy Resident  06/20/2023 2:54 PM

## 2023-06-21 ENCOUNTER — Other Ambulatory Visit: Payer: Self-pay | Admitting: Physician Assistant

## 2023-06-21 DIAGNOSIS — N138 Other obstructive and reflux uropathy: Secondary | ICD-10-CM

## 2023-06-21 NOTE — Progress Notes (Deleted)
 06/21/23 10:06 PM   Joe Dunlap Joe Dunlap 1960-07-08 [id number]  Referring provider:  Lorrie Rothman, MD [address] [place],  [place] [address]  Urological history  BPH with LUTS  -Urolift (2021) -Cystoscopy (08/08/21) - hypervascularity- loss of anterior channel  -cysto (12/2022) - Normal prostate with evidence of anterior channel along with UroLift clips present creating this defect  -TURP (06/2023) - prostate chips negative for malignancy   2. Bilateral kidney sones  -stone composition of 70% calcium oxalate monohydrate, 27% calcium oxalate dihydrate and 3% calcium phosphate  -2018 s/p ESWL x 1 in remote past.  S/p sucessful right ESWL for a 6 mm proximal ureteral calculus on 2/18 -CT renal stone study (07/22/2021) -large stone within the inferior pole of the left kidney measuring 11 mm. Multiple 2-5 mm stones throughout the right kidney. Urinary bladder is unremarkable.  -CT renal stone study (07/2022) - 1.4 cm calculus within the left renal pelvis with associated mild dilatation and wall thickening of the left renal pelvis and mild surrounding inflammatory change -left ESWL   4. Erectile dysfunction -sildenafil  20 mg, on-demand-dosing  No chief complaint on file.   HPI: Joe Dunlap is a 63 y.o.male who presents today for trial of void.    Previous records reviewed.  He underwent TURP on 06/14/2023 andpresented to the ED on 06/17/2023 for urinary retention.  His urine culture was positive from the ED and he was prescribed Amoxicillin 875 mg BID x 5 days.    PVR ***   PMH: Past Medical History:  Diagnosis Date   Anginal pain (HCC)    Aortic atherosclerosis (HCC)    Benign prostatic hyperplasia with urinary obstruction    Bilateral carotid artery disease (HCC)    Coronary artery disease    DDD (degenerative disc disease), lumbar    GERD (gastroesophageal reflux disease)    Headache    Hepatic steatosis    Hyperlipemia    Hypertension    Impaired glucose  tolerance    Kidney stones 2018   Labral tear of hip, degenerative    Lumbar radiculitis    Obesity (BMI 30-39.9)    OSA on CPAP    Shingles    Stomach ulcer    Ventral hernia     Surgical History: Past Surgical History:  Procedure Laterality Date   COLONOSCOPY WITH PROPOFOL  N/A 02/11/2018   Procedure: COLONOSCOPY WITH PROPOFOL ;  Surgeon: Deveron Fly, MD;  Location[facility] ENDOSCOPY;  Service: Endoscopy;  Laterality: N/A;   CYSTOSCOPY WITH INSERTION OF UROLIFT N/A 08/14/2019   Procedure: CYSTOSCOPY WITH INSERTION OF UROLIFT;  Surgeon: Dustin Gimenez, MD;  Location: [facility] ORS;  Service: Urology;  Laterality: N/A;   CYSTOSCOPY WITH INSERTION OF UROLIFT N/A 09/08/2021   Procedure: CYSTOSCOPY WITH INSERTION OF UROLIFT;  Surgeon: Dustin Gimenez, MD;  Location: [facility] ORS;  Service: Urology;  Laterality: N/A;   EXTRACORPOREAL SHOCK WAVE LITHOTRIPSY Right 06/25/2016   Procedure: EXTRACORPOREAL SHOCK WAVE LITHOTRIPSY (ESWL);  Surgeon: Dustin Gimenez, MD;  Location: [facility] ORS;  Service: Urology;  Laterality: Right;   EXTRACORPOREAL SHOCK WAVE LITHOTRIPSY Right 08/13/2022   Procedure: EXTRACORPOREAL SHOCK WAVE LITHOTRIPSY (ESWL);  Surgeon: Lawerence Pressman, MD;  Location: [facility] ORS;  Service: Urology;  Laterality: Right;   GASTRIC ULCER REPAIR     KNEE SURGERY Left    LEFT HEART CATH AND CORONARY ANGIOGRAPHY Left 05/11/2018   Procedure: LEFT HEART CATH AND CORONARY ANGIOGRAPHY;  Surgeon: Percival Brace, MD;  Location: [facility] INVASIVE CV LAB;  Service:  Cardiovascular;  Laterality: Left;   TONSILLECTOMY     TRANSURETHRAL RESECTION OF PROSTATE N/A 06/14/2023   Procedure: TURP (TRANSURETHRAL RESECTION OF PROSTATE);  Surgeon: Dustin Gimenez, MD;  Location: [facility] ORS;  Service: Urology;  Laterality: N/A;   UPPER GI ENDOSCOPY  2008   stomach ulcer   UVULOPALATOPHARYNGOPLASTY (UPPP)/TONSILLECTOMY/SEPTOPLASTY  2000    Home Medications:  Allergies as of 06/22/2023       Reactions   Nsaids Other  (See Comments)   Hx of ulcer        Medication List        Accurate as of June 21, 2023 10:06 PM. If you have any questions, ask your nurse or doctor.          amLODipine 10 MG tablet Commonly known as: NORVASC Take 10 mg by mouth daily.   GAS-X PO Take 1 tablet by mouth 2 (two) times daily.   HYDROcodone -acetaminophen  5-325 MG tablet Commonly known as: NORCO/VICODIN Take 1-2 tablets by mouth every 6 (six) hours as needed for moderate pain (pain score 4-6).   isosorbide mononitrate 30 MG 24 hr tablet Commonly known as: IMDUR Take 30 mg by mouth daily.   metoprolol succinate 25 MG 24 hr tablet Commonly known as: TOPROL-XL Take 12.5 mg by mouth daily.   omeprazole 20 MG tablet Commonly known as: PRILOSEC OTC Take 20 mg by mouth daily.   oxybutynin  5 MG tablet Commonly known as: DITROPAN  Take 1 tablet (5 mg total) by mouth every 8 (eight) hours as needed for bladder spasms.   pravastatin 40 MG tablet Commonly known as: PRAVACHOL Take 40 mg by mouth at bedtime.        Allergies:  Allergies  Allergen Reactions   Nsaids Other (See Comments)    Hx of ulcer    Family History: Family History  Problem Relation Age of Onset   Heart attack Father    Prostatitis Neg Hx    Prostate cancer Neg Hx    Kidney cancer Neg Hx    Bladder Cancer Neg Hx     Social History:  reports that he has never smoked. He has never used smokeless tobacco. He reports that he does not currently use alcohol after a past usage of about 5.0 standard drinks of alcohol per week. He reports that he does not use drugs.   Physical Exam: There were no vitals taken for this visit.  Constitutional:  Well nourished. Alert and oriented, No acute distress. HEENT: [place] AT, moist mucus membranes.  Trachea midline, no masses. Cardiovascular: No clubbing, cyanosis, or edema. Respiratory: Normal respiratory effort, no increased work of breathing. GI: Abdomen is soft, non tender, non distended, no  abdominal masses. Liver and spleen not palpable.  No hernias appreciated.  Stool sample for occult testing is not indicated.   GU: No CVA tenderness.  No bladder fullness or masses.  Patient with circumcised/uncircumcised phallus. ***Foreskin easily retracted***  Urethral meatus is patent.  No penile discharge. No penile lesions or rashes. Scrotum without lesions, cysts, rashes and/or edema.  Testicles are located scrotally bilaterally. No masses are appreciated in the testicles. Left and right epididymis are normal. Rectal: Patient with  normal sphincter tone. Anus and perineum without scarring or rashes. No rectal masses are appreciated. Prostate is approximately *** grams, *** nodules are appreciated. Seminal vesicles are normal. Skin: No rashes, bruises or suspicious lesions. Lymph: No cervical or inguinal adenopathy. Neurologic: Grossly intact, no focal deficits, moving all 4 extremities. Psychiatric: Normal mood and affect.  Laboratory Data: CBC    Component Value Date/Time   WBC 6.6 06/07/2023 1128   RBC 4.77 06/07/2023 1128   HGB 14.8 06/07/2023 1128   HCT 42.4 06/07/2023 1128   PLT 203 06/07/2023 1128   MCV 88.9 06/07/2023 1128   MCH 31.0 06/07/2023 1128   MCHC 34.9 06/07/2023 1128   RDW 12.8 06/07/2023 1128   LYMPHSABS 2.4 11/15/2014 1448   MONOABS 0.6 11/15/2014 1448   EOSABS 0.2 11/15/2014 1448   BASOSABS 0.1 11/15/2014 1448    BMET    Component Value Date/Time   NA 138 06/07/2023 1128   K 3.7 06/07/2023 1128   CL 109 06/07/2023 1128   CO2 21 (L) 06/07/2023 1128   GLUCOSE 125 (H) 06/07/2023 1128   BUN 14 06/07/2023 1128   CREATININE 0.89 06/07/2023 1128   CALCIUM 8.8 (L) 06/07/2023 1128   GFRNONAA >60 06/07/2023 1128    Urinalysis Component     Latest Ref Rng 06/17/2023  Color, Urine     YELLOW  YELLOW !   Appearance     CLEAR  CLOUDY !   Specific Gravity, Urine     1.005 - 1.030  1.015   pH     5.0 - 8.0  6.0   Glucose, UA     NEGATIVE mg/dL NEGATIVE    Hgb urine dipstick     NEGATIVE  LARGE !   Bilirubin Urine     NEGATIVE  NEGATIVE   Ketones, ur     NEGATIVE mg/dL NEGATIVE   Protein     NEGATIVE mg/dL 045 !   Nitrite     NEGATIVE  NEGATIVE   Leukocytes,Ua     NEGATIVE  TRACE !   Squamous Epithelial / HPF     0 - 5 /HPF 0   WBC, UA     0 - 5 WBC/hpf 11-20   RBC / HPF     0 - 5 RBC/hpf >50   Bacteria, UA     NONE SEEN  RARE !   Mucus PRESENT   Specimen Source URINE, CLEAN CATCH   Sperm, UA PRESENT     Legend: ! Abnormal  Urine Culture Order: [id number]  Status: Final result     Next appt: 06/22/2023 at 08:20 AM in Urology [facility], PA-C)   Test Result Released: Yes (seen)   Specimen Information: Urine, Random  0 Result Notes    Component Ref Range & Units (hover) 4 d ago  Specimen Description URINE, RANDOM Performed at [facility], [address] [place], [place] [address]  Special Requests NONE Reflexed from [phone] Performed at [facility], [address] [place], [place] [address]  Culture >=100,000 COLONIES/mL ENTEROCOCCUS FAECALIS Abnormal   Report Status 06/19/2023 FINAL  Organism ID, Bacteria ENTEROCOCCUS FAECALIS Abnormal   Resulting Agency [facility]     Susceptibility   Enterococcus faecalis    MIC    AMPICILLIN  <=2 SENSITIVE Sensitive    NITROFURANTOIN  <=16 SENSIT... Sensitive    VANCOMYCIN 1 SENSITIVE Sensitive           Susceptibility Comments  Enterococcus faecalis  >=100,000 COLONIES/mL ENTEROCOCCUS FAECALIS     Specimen Collected: 06/17/23 10:16 Last Resulted: 06/19/23 05:41     I have reviewed the labs.   Pertinent Imaging: ***  Assessment & Plan:    1. Urinary retention -***  2. BPH with urinary obstruction  -s/p TURP -keep follow up with Dr. Ace Holder in May   3. Nephrolithiasis -  no symptoms at this time  No follow-ups on file.  Matilde Son, PA-C   [facility])  [address], [address] [place], [place] [address] [phone]

## 2023-06-22 ENCOUNTER — Encounter: Payer: Self-pay | Admitting: Urology

## 2023-06-22 ENCOUNTER — Ambulatory Visit: Admitting: Urology

## 2023-06-22 VITALS — BP 143/84 | HR 85 | Ht 70.0 in | Wt 252.0 lb

## 2023-06-22 DIAGNOSIS — N200 Calculus of kidney: Secondary | ICD-10-CM

## 2023-06-22 DIAGNOSIS — R339 Retention of urine, unspecified: Secondary | ICD-10-CM

## 2023-06-22 DIAGNOSIS — R338 Other retention of urine: Secondary | ICD-10-CM

## 2023-06-22 DIAGNOSIS — N138 Other obstructive and reflux uropathy: Secondary | ICD-10-CM

## 2023-06-22 DIAGNOSIS — N401 Enlarged prostate with lower urinary tract symptoms: Secondary | ICD-10-CM

## 2023-06-22 LAB — BLADDER SCAN AMB NON-IMAGING

## 2023-06-22 NOTE — Progress Notes (Signed)
 Catheter Removal  Patient is present today for a catheter removal.  36ml of water was drained from the balloon. A 20FR foley cath was removed from the bladder, no complications were noted. Patient tolerated well.  Performed by: Jenny Mohs, CMA  Follow up/ Additional notes: follow up  this afternoon 06/22/23  at 3:30pm

## 2023-06-22 NOTE — Progress Notes (Signed)
 06/22/2023 4:22 PM   Joe Dunlap [facility] [birth year] [id number]  Referring provider: Lorrie Rothman, MD [address] [place],  [place] [address]  Urological history  BPH with LUTS  -Urolift (2021) -Cystoscopy (08/08/21) - hypervascularity- loss of anterior channel  -cysto (12/2022) - Normal prostate with evidence of anterior channel along with UroLift clips present creating this defect  -TURP (06/2023) - prostate chips negative for malignancy    2. Bilateral kidney sones  -stone composition of 70% calcium oxalate monohydrate, 27% calcium oxalate dihydrate and 3% calcium phosphate  -2018 s/p ESWL x 1 in remote past.  S/p sucessful right ESWL for a 6 mm proximal ureteral calculus on 2/18 -CT renal stone study (07/22/2021) -large stone within the inferior pole of the left kidney measuring 11 mm. Multiple 2-5 mm stones throughout the right kidney. Urinary bladder is unremarkable.  -CT renal stone study (07/2022) - 1.4 cm calculus within the left renal pelvis with associated mild dilatation and wall thickening of the left renal pelvis and mild surrounding inflammatory change -left ESWL    4. Erectile dysfunction -sildenafil  20 mg, on-demand-dosing  Chief Complaint  Patient presents with   pull and send   HPI: Joe Dunlap is a 63 y.o. man who presents today for trial of void.  Previous records reviewed.   He underwent TURP on 06/14/2023 andpresented to the ED on 06/17/2023 for urinary retention. His urine culture was positive from the ED and he was prescribed Amoxicillin 875 mg BID x 5 days.   He has been voiding without issue today.  Patient denies any modifying or aggravating factors.  Patient denies any recent UTI's, gross hematuria, dysuria or suprapubic/flank pain.  Patient denies any fevers, chills, nausea or vomiting.    PVR 31 mL   PMH: Past Medical History:  Diagnosis Date   Anginal pain (HCC)    Aortic atherosclerosis (HCC)    Benign prostatic hyperplasia  with urinary obstruction    Bilateral carotid artery disease (HCC)    Coronary artery disease    DDD (degenerative disc disease), lumbar    GERD (gastroesophageal reflux disease)    Headache    Hepatic steatosis    Hyperlipemia    Hypertension    Impaired glucose tolerance    Kidney stones 2018   Labral tear of hip, degenerative    Lumbar radiculitis    Obesity (BMI 30-39.9)    OSA on CPAP    Shingles    Stomach ulcer    Ventral hernia     Surgical History: Past Surgical History:  Procedure Laterality Date   COLONOSCOPY WITH PROPOFOL  N/A 02/11/2018   Procedure: COLONOSCOPY WITH PROPOFOL ;  Surgeon: Deveron Fly, MD;  Location: [facility] ENDOSCOPY;  Service: Endoscopy;  Laterality: N/A;   CYSTOSCOPY WITH INSERTION OF UROLIFT N/A 08/14/2019   Procedure: CYSTOSCOPY WITH INSERTION OF UROLIFT;  Surgeon: Dustin Gimenez, MD;  Location: [facility] ORS;  Service: Urology;  Laterality: N/A;   CYSTOSCOPY WITH INSERTION OF UROLIFT N/A 09/08/2021   Procedure: CYSTOSCOPY WITH INSERTION OF UROLIFT;  Surgeon: Dustin Gimenez, MD;  Location: [facility] ORS;  Service: Urology;  Laterality: N/A;   EXTRACORPOREAL SHOCK WAVE LITHOTRIPSY Right 06/25/2016   Procedure: EXTRACORPOREAL SHOCK WAVE LITHOTRIPSY (ESWL);  Surgeon: Dustin Gimenez, MD;  Location: [facility] ORS;  Service: Urology;  Laterality: Right;   EXTRACORPOREAL SHOCK WAVE LITHOTRIPSY Right 08/13/2022   Procedure: EXTRACORPOREAL SHOCK WAVE LITHOTRIPSY (ESWL);  Surgeon: Lawerence Pressman, MD;  Location: [facility] ORS;  Service: Urology;  Laterality:  Right;   GASTRIC ULCER REPAIR     KNEE SURGERY Left    LEFT HEART CATH AND CORONARY ANGIOGRAPHY Left 05/11/2018   Procedure: LEFT HEART CATH AND CORONARY ANGIOGRAPHY;  Surgeon: Percival Brace, MD;  Location: [facility] INVASIVE CV LAB;  Service: Cardiovascular;  Laterality: Left;   TONSILLECTOMY     TRANSURETHRAL RESECTION OF PROSTATE N/A 06/14/2023   Procedure: TURP (TRANSURETHRAL RESECTION OF PROSTATE);  Surgeon:  Dustin Gimenez, MD;  Location: [facility] ORS;  Service: Urology;  Laterality: N/A;   UPPER GI ENDOSCOPY  2008   stomach ulcer   UVULOPALATOPHARYNGOPLASTY (UPPP)/TONSILLECTOMY/SEPTOPLASTY  2000    Home Medications:  Allergies as of 06/22/2023       Reactions   Nsaids Other (See Comments)   Hx of ulcer        Medication List        Accurate as of June 22, 2023  4:22 PM. If you have any questions, ask your nurse or doctor.          amLODipine 10 MG tablet Commonly known as: NORVASC Take 10 mg by mouth daily.   amoxicillin 875 MG tablet Commonly known as: AMOXIL Take 875 mg by mouth 2 (two) times daily.   GAS-X PO Take 1 tablet by mouth 2 (two) times daily.   HYDROcodone -acetaminophen  5-325 MG tablet Commonly known as: NORCO/VICODIN Take 1-2 tablets by mouth every 6 (six) hours as needed for moderate pain (pain score 4-6).   isosorbide mononitrate 30 MG 24 hr tablet Commonly known as: IMDUR Take 30 mg by mouth daily.   metoprolol succinate 25 MG 24 hr tablet Commonly known as: TOPROL-XL Take 12.5 mg by mouth daily.   omeprazole 20 MG tablet Commonly known as: PRILOSEC OTC Take 20 mg by mouth daily.   oxybutynin  5 MG tablet Commonly known as: DITROPAN  Take 1 tablet (5 mg total) by mouth every 8 (eight) hours as needed for bladder spasms.   pravastatin 40 MG tablet Commonly known as: PRAVACHOL Take 40 mg by mouth at bedtime.        Allergies:  Allergies  Allergen Reactions   Nsaids Other (See Comments)    Hx of ulcer    Family History: Family History  Problem Relation Age of Onset   Heart attack Father    Prostatitis Neg Hx    Prostate cancer Neg Hx    Kidney cancer Neg Hx    Bladder Cancer Neg Hx     Social History:  reports that he has never smoked. He has never used smokeless tobacco. He reports that he does not currently use alcohol after a past usage of about 5.0 standard drinks of alcohol per week. He reports that he does not use  drugs.  ROS: Pertinent ROS in HPI  Physical Exam: BP (!) 143/84   Pulse 85   Ht 5\' 10"  (1.778 m)   Wt 252 lb (114.3 kg)   BMI 36.16 kg/m   Constitutional:  Well nourished. Alert and oriented, No acute distress. HEENT: [place] AT, moist mucus membranes.  Trachea midline Cardiovascular: No clubbing, cyanosis, or edema. Respiratory: Normal respiratory effort, no increased work of breathing. Neurologic: Grossly intact, no focal deficits, moving all 4 extremities. Psychiatric: Normal mood and affect.  Laboratory Data: Lab Results  Component Value Date   WBC 6.6 06/07/2023   HGB 14.8 06/07/2023   HCT 42.4 06/07/2023   MCV 88.9 06/07/2023   PLT 203 06/07/2023    Lab Results  Component Value Date   CREATININE  0.89 06/07/2023   Urinalysis See EPIC and HPI  I have reviewed the labs.   Pertinent Imaging:  06/22/23 15:27  Scan Result 31ml   Assessment & Plan:    1.  Urinary retention - BLADDER SCAN AMB NON-IMAGING, PVR demonstrates adequate emptying  - He was very concerned about going back into urinary retention, so I gave him straight cath supplies in case he was into retention tonight, I offered to let him demonstrate technique while still in the office, but he opted out stating he was comfortable doing it at home if needed  2. BPH with urinary obstruction  -s/p TURP -keep follow up with Dr. Ace Holder in May   3. Nephrolithiasis -no symptoms at this time   Return for keep follow up with Dr. Ace Holder .  These notes generated with voice recognition software. I apologize for typographical errors.  Briant Camper  [facility] [address]  [address] [place], [place] [address] [phone]

## 2023-06-23 ENCOUNTER — Ambulatory Visit: Admitting: Urology

## 2023-06-23 DIAGNOSIS — Z029 Encounter for administrative examinations, unspecified: Secondary | ICD-10-CM

## 2023-07-20 ENCOUNTER — Ambulatory Visit: Payer: Self-pay | Admitting: Urology

## 2023-07-20 ENCOUNTER — Ambulatory Visit
Admission: RE | Admit: 2023-07-20 | Discharge: 2023-07-20 | Disposition: A | Discharge status: Home / Self Care | Attending: Urology | Admitting: Urology

## 2023-07-20 ENCOUNTER — Ambulatory Visit
Admission: RE | Admit: 2023-07-20 | Discharge: 2023-07-20 | Disposition: A | Discharge status: Home / Self Care | Source: Ambulatory Visit | Attending: Urology | Admitting: Urology

## 2023-07-20 ENCOUNTER — Encounter: Payer: Self-pay | Admitting: Urology

## 2023-07-20 VITALS — BP 148/76 | HR 82 | Ht 70.0 in | Wt 252.0 lb

## 2023-07-20 DIAGNOSIS — N200 Calculus of kidney: Secondary | ICD-10-CM

## 2023-07-20 DIAGNOSIS — R109 Unspecified abdominal pain: Secondary | ICD-10-CM

## 2023-07-20 DIAGNOSIS — N401 Enlarged prostate with lower urinary tract symptoms: Secondary | ICD-10-CM | POA: Diagnosis not present

## 2023-07-20 DIAGNOSIS — R1031 Right lower quadrant pain: Secondary | ICD-10-CM

## 2023-07-20 DIAGNOSIS — N138 Other obstructive and reflux uropathy: Secondary | ICD-10-CM | POA: Diagnosis not present

## 2023-07-20 DIAGNOSIS — R10A0 Flank pain, unspecified side: Secondary | ICD-10-CM

## 2023-07-20 LAB — BLADDER SCAN AMB NON-IMAGING: Scan Result: 37

## 2023-07-20 NOTE — Progress Notes (Signed)
 I,Amy L Pierron,acting as a scribe for Dustin Gimenez, MD.,have documented all relevant documentation on the behalf of Dustin Gimenez, MD,as directed by  Dustin Gimenez, MD while in the presence of Dustin Gimenez, MD.  07/20/2023 12:12 PM   Joe Dunlap [facility] 09-12-1978 [id number]  Referring provider: Lorrie Rothman, MD [address] [place],  [place] [address]  Chief Complaint  Patient presents with   Benign Prostatic Hypertrophy    HPI: 63 year-old male with a personal history of long-standing benign prostatic hyperplasia (BPH) and recurrent stone disease presents for follow-up.   He has undergone multiple procedures, including two UroLift procedures and a recent transurethral resection of the prostate (TURP).   He was seen in the emergency room for presumed UTI and urinary retention, which resolved after catheter removal. He did end up growing Enterococcus and was Amoxicillin.   He reports satisfaction with his urinary function, noting decreased frequency and improved emptying.   He denies current burning or hematuria, though he experienced passing tissue or clots post-procedure.   He reports new right lower back pain since Sunday, which he suspects may be related to kidney stones. He describes it as constant and exacerbated by movement.   IPSS     Row Name 07/20/23 0900         International Prostate Symptom Score   How often have you had the sensation of not emptying your bladder? Not at All     How often have you had to urinate less than every two hours? Less than 1 in 5 times     How often have you found you stopped and started again several times when you urinated? Not at All     How often have you found it difficult to postpone urination? Less than 1 in 5 times     How often have you had a weak urinary stream? Not at All     How often have you had to strain to start urination? Not at All     How many times did you typically get up at night to urinate? 1 Time      Total IPSS Score 3       Quality of Life due to urinary symptoms   If you were to spend the rest of your life with your urinary condition just the way it is now how would you feel about that? Pleased            Score:  1-7 Mild 8-19 Moderate 20-35 Severe Results for orders placed or performed in visit on 07/20/23  Bladder Scan (Post Void Residual) in office  Result Value Ref Range   Scan Result 37 ml     PMH: Past Medical History:  Diagnosis Date   Anginal pain (HCC)    Aortic atherosclerosis (HCC)    Benign prostatic hyperplasia with urinary obstruction    Bilateral carotid artery disease (HCC)    Coronary artery disease    DDD (degenerative disc disease), lumbar    GERD (gastroesophageal reflux disease)    Headache    Hepatic steatosis    Hyperlipemia    Hypertension    Impaired glucose tolerance    Kidney stones 2018   Labral tear of hip, degenerative    Lumbar radiculitis    Obesity (BMI 30-39.9)    OSA on CPAP    Shingles    Stomach ulcer    Ventral hernia     Surgical History: Past Surgical History:  Procedure Laterality  Date   COLONOSCOPY WITH PROPOFOL  N/A 02/11/2018   Procedure: COLONOSCOPY WITH PROPOFOL ;  Surgeon: Deveron Fly, MD;  Location: [facility] ENDOSCOPY;  Service: Endoscopy;  Laterality: N/A;   CYSTOSCOPY WITH INSERTION OF UROLIFT N/A 08/14/2019   Procedure: CYSTOSCOPY WITH INSERTION OF UROLIFT;  Surgeon: Dustin Gimenez, MD;  Location: [facility] ORS;  Service: Urology;  Laterality: N/A;   CYSTOSCOPY WITH INSERTION OF UROLIFT N/A 09/08/2021   Procedure: CYSTOSCOPY WITH INSERTION OF UROLIFT;  Surgeon: Dustin Gimenez, MD;  Location: [facility] ORS;  Service: Urology;  Laterality: N/A;   EXTRACORPOREAL SHOCK WAVE LITHOTRIPSY Right 06/25/2016   Procedure: EXTRACORPOREAL SHOCK WAVE LITHOTRIPSY (ESWL);  Surgeon: Dustin Gimenez, MD;  Location: [facility] ORS;  Service: Urology;  Laterality: Right;   EXTRACORPOREAL SHOCK WAVE LITHOTRIPSY Right 08/13/2022   Procedure:  EXTRACORPOREAL SHOCK WAVE LITHOTRIPSY (ESWL);  Surgeon: Lawerence Pressman, MD;  Location: [facility] ORS;  Service: Urology;  Laterality: Right;   GASTRIC ULCER REPAIR     KNEE SURGERY Left    LEFT HEART CATH AND CORONARY ANGIOGRAPHY Left 05/11/2018   Procedure: LEFT HEART CATH AND CORONARY ANGIOGRAPHY;  Surgeon: Percival Brace, MD;  Location: [facility] INVASIVE CV LAB;  Service: Cardiovascular;  Laterality: Left;   TONSILLECTOMY     TRANSURETHRAL RESECTION OF PROSTATE N/A 06/14/2023   Procedure: TURP (TRANSURETHRAL RESECTION OF PROSTATE);  Surgeon: Dustin Gimenez, MD;  Location: [facility] ORS;  Service: Urology;  Laterality: N/A;   UPPER GI ENDOSCOPY  2008   stomach ulcer   UVULOPALATOPHARYNGOPLASTY (UPPP)/TONSILLECTOMY/SEPTOPLASTY  2000    Home Medications:  Allergies as of 07/20/2023       Reactions   Nsaids Other (See Comments)   Hx of ulcer        Medication List        Accurate as of Jul 20, 2023 12:12 PM. If you have any questions, ask your nurse or doctor.          STOP taking these medications    amoxicillin 875 MG tablet Commonly known as: AMOXIL   HYDROcodone -acetaminophen  5-325 MG tablet Commonly known as: NORCO/VICODIN   oxybutynin  5 MG tablet Commonly known as: DITROPAN        TAKE these medications    amLODipine 10 MG tablet Commonly known as: NORVASC Take 10 mg by mouth daily.   GAS-X PO Take 1 tablet by mouth 2 (two) times daily.   isosorbide mononitrate 30 MG 24 hr tablet Commonly known as: IMDUR Take 30 mg by mouth daily.   metoprolol succinate 25 MG 24 hr tablet Commonly known as: TOPROL-XL Take 12.5 mg by mouth daily.   omeprazole 20 MG tablet Commonly known as: PRILOSEC OTC Take 20 mg by mouth daily.   pravastatin 40 MG tablet Commonly known as: PRAVACHOL Take 40 mg by mouth at bedtime.        Allergies:  Allergies  Allergen Reactions   Nsaids Other (See Comments)    Hx of ulcer    Family History: Family History  Problem  Relation Age of Onset   Heart attack Father    Prostatitis Neg Hx    Prostate cancer Neg Hx    Kidney cancer Neg Hx    Bladder Cancer Neg Hx     Social History:  reports that he has never smoked. He has never used smokeless tobacco. He reports that he does not currently use alcohol after a past usage of about 5.0 standard drinks of alcohol per week. He reports that he does not use drugs.   Physical  Exam: BP (!) 148/76   Pulse 82   Ht 5\' 10"  (1.778 m)   Wt 252 lb (114.3 kg)   BMI 36.16 kg/m   Constitutional:  Alert and oriented, No acute distress. HEENT: [place] AT, moist mucus membranes.  Trachea midline, no masses. Neurologic: Grossly intact, no focal deficits, moving all 4 extremities. Psychiatric: Normal mood and affect.   Assessment & Plan:    1. Benign Prostatic Hyperplasia (BPH) with Lower Urinary Tract Symptoms (LUTS) - He reports satisfaction with urinary function, including reduced frequency and improved emptying. Follow-up with Cathleen Coach in [place] in six months is planned, with consideration for PSA testing at that time to monitor prostate health.  2. Recurrent Stone Disease/ right low back vs. Flank pain - History of recurrent kidney stones and reports possible symptoms of a new stone, including right lower back pain that started a few days ago. The pain is described as intermittent and possibly musculoskeletal. An X-ray will be performed today to evaluate for the presence of stones. If symptoms worsen, a CT scan may be considered.   Return in about 6 months (around 01/20/2024) for IPSS, PVR, PSA.  I have reviewed the above documentation for accuracy and completeness, and I agree with the above.   Dustin Gimenez, MD    [facility] [address], [address] [place], [place] [address] [phone]

## 2023-07-28 ENCOUNTER — Ambulatory Visit
Admission: RE | Admit: 2023-07-28 | Discharge: 2023-07-28 | Disposition: A | Discharge status: Home / Self Care | Source: Ambulatory Visit | Attending: Urology | Admitting: Urology

## 2023-07-28 DIAGNOSIS — R1031 Right lower quadrant pain: Secondary | ICD-10-CM | POA: Diagnosis present

## 2023-07-29 ENCOUNTER — Ambulatory Visit: Payer: Self-pay | Admitting: Urology

## 2023-08-01 NOTE — Progress Notes (Signed)
 08/04/2023 10:51 AM   Darleen Ee Feb 12, 1961 [id number]  Referring provider: Lorrie Rothman, MD [address] [place],  [place] [address]  Urological history  BPH with LUTS  -Urolift (2021) -Cystoscopy (08/08/21) - hypervascularity- loss of anterior channel  -cysto (12/2022) - Normal prostate with evidence of anterior channel along with UroLift clips present creating this defect  -TURP (06/2023) - prostate chips negative for malignancy    2. Bilateral kidney stones  -stone composition of 70% calcium oxalate monohydrate, 27% calcium oxalate dihydrate and 3% calcium phosphate  -2018 s/p ESWL x 1 in remote past.  S/p sucessful right ESWL for a 6 mm proximal ureteral calculus on 2/18 -CT renal stone study (07/22/2021) -large stone within the inferior pole of the left kidney measuring 11 mm. Multiple 2-5 mm stones throughout the right kidney. Urinary bladder is unremarkable.  -CT renal stone study (07/2022) - 1.4 cm calculus within the left renal pelvis with associated mild dilatation and wall thickening of the left renal pelvis and mild surrounding inflammatory change -left ESWL    4. Erectile dysfunction -sildenafil  20 mg, on-demand-dosing  Chief Complaint  Patient presents with   Other   HPI: Joe Dunlap is a 63 y.o. man who presents today for discussion for definitive stone treatment.   Previous records reviewed.   He has not had any flank pain recently.  He denied any passions or fragments.  Patient denies any modifying or aggravating factors.  Patient denies any recent UTI's, gross hematuria, dysuria or suprapubic/flank pain.  Patient denies any fevers, chills, nausea or vomiting.    CT renal stone study (07/2023) - Stones in the proximal right ureter measuring 2 mm and 3 mm without obstruction.  Other nonobstructing right intrarenal stones measuring up to 10 mm.  UA yellow slightly cloudy, specific gravity 1.015, pH 7.0, 1+ protein, 3+ blood, 2+ leukocyte,  greater than 30 WBCs, greater than 30 RBCs, 0-10 epithelial cells and moderate bacteria.  PMH: Past Medical History:  Diagnosis Date   Anginal pain (HCC)    Aortic atherosclerosis (HCC)    Benign prostatic hyperplasia with urinary obstruction    Bilateral carotid artery disease (HCC)    Coronary artery disease    DDD (degenerative disc disease), lumbar    GERD (gastroesophageal reflux disease)    Headache    Hepatic steatosis    Hyperlipemia    Hypertension    Impaired glucose tolerance    Kidney stones 2018   Labral tear of hip, degenerative    Lumbar radiculitis    Obesity (BMI 30-39.9)    OSA on CPAP    Shingles    Stomach ulcer    Ventral hernia     Surgical History: Past Surgical History:  Procedure Laterality Date   COLONOSCOPY WITH PROPOFOL  N/A 02/11/2018   Procedure: COLONOSCOPY WITH PROPOFOL ;  Surgeon: Deveron Fly, MD;  Location: [facility] ENDOSCOPY;  Service: Endoscopy;  Laterality: N/A;   CYSTOSCOPY WITH INSERTION OF UROLIFT N/A 08/14/2019   Procedure: CYSTOSCOPY WITH INSERTION OF UROLIFT;  Surgeon: Dustin Gimenez, MD;  Location: [facility] ORS;  Service: Urology;  Laterality: N/A;   CYSTOSCOPY WITH INSERTION OF UROLIFT N/A 09/08/2021   Procedure: CYSTOSCOPY WITH INSERTION OF UROLIFT;  Surgeon: Dustin Gimenez, MD;  Location: [facility] ORS;  Service: Urology;  Laterality: N/A;   EXTRACORPOREAL SHOCK WAVE LITHOTRIPSY Right 06/25/2016   Procedure: EXTRACORPOREAL SHOCK WAVE LITHOTRIPSY (ESWL);  Surgeon: Dustin Gimenez, MD;  Location: [facility] ORS;  Service: Urology;  Laterality: Right;  EXTRACORPOREAL SHOCK WAVE LITHOTRIPSY Right 08/13/2022   Procedure: EXTRACORPOREAL SHOCK WAVE LITHOTRIPSY (ESWL);  Surgeon: Lawerence Pressman, MD;  Location: [facility] ORS;  Service: Urology;  Laterality: Right;   GASTRIC ULCER REPAIR     KNEE SURGERY Left    LEFT HEART CATH AND CORONARY ANGIOGRAPHY Left 05/11/2018   Procedure: LEFT HEART CATH AND CORONARY ANGIOGRAPHY;  Surgeon: Percival Brace, MD;   Location: [facility] INVASIVE CV LAB;  Service: Cardiovascular;  Laterality: Left;   TONSILLECTOMY     TRANSURETHRAL RESECTION OF PROSTATE N/A 06/14/2023   Procedure: TURP (TRANSURETHRAL RESECTION OF PROSTATE);  Surgeon: Dustin Gimenez, MD;  Location: [facility] ORS;  Service: Urology;  Laterality: N/A;   UPPER GI ENDOSCOPY  2008   stomach ulcer   UVULOPALATOPHARYNGOPLASTY (UPPP)/TONSILLECTOMY/SEPTOPLASTY  2000    Home Medications:  Allergies as of 08/04/2023       Reactions   Nsaids Other (See Comments)   Hx of ulcer        Medication List        Accurate as of August 04, 2023 10:51 AM. If you have any questions, ask your nurse or doctor.          amLODipine 10 MG tablet Commonly known as: NORVASC Take 10 mg by mouth daily.   GAS-X PO Take 1 tablet by mouth 2 (two) times daily.   isosorbide mononitrate 30 MG 24 hr tablet Commonly known as: IMDUR Take 30 mg by mouth daily.   metoprolol succinate 25 MG 24 hr tablet Commonly known as: TOPROL-XL Take 12.5 mg by mouth daily.   omeprazole 20 MG tablet Commonly known as: PRILOSEC OTC Take 20 mg by mouth daily.   pravastatin 40 MG tablet Commonly known as: PRAVACHOL Take 40 mg by mouth at bedtime.        Allergies:  Allergies  Allergen Reactions   Nsaids Other (See Comments)    Hx of ulcer    Family History: Family History  Problem Relation Age of Onset   Heart attack Father    Prostatitis Neg Hx    Prostate cancer Neg Hx    Kidney cancer Neg Hx    Bladder Cancer Neg Hx     Social History:  reports that he has never smoked. He has never used smokeless tobacco. He reports that he does not currently use alcohol after a past usage of about 5.0 standard drinks of alcohol per week. He reports that he does not use drugs.  ROS: Pertinent ROS in HPI  Physical Exam: BP 124/82   Pulse 80   Ht 5' 10 (1.778 m)   Wt 252 lb (114.3 kg)   BMI 36.16 kg/m   Constitutional:  Well nourished. Alert and oriented, No acute  distress. HEENT: [place] AT, moist mucus membranes.  Trachea midline Cardiovascular: No clubbing, cyanosis, or edema. Respiratory: Normal respiratory effort, no increased work of breathing. Neurologic: Grossly intact, no focal deficits, moving all 4 extremities. Psychiatric: Normal mood and affect.   Laboratory Data: Lab Results  Component Value Date   WBC 6.6 06/07/2023   HGB 14.8 06/07/2023   HCT 42.4 06/07/2023   MCV 88.9 06/07/2023   PLT 203 06/07/2023    Lab Results  Component Value Date   CREATININE 0.89 06/07/2023   Urinalysis See EPIC and HPI  I have reviewed the labs.   Pertinent Imaging: EXAMINATION: CT RENAL STONE STUDY   CLINICAL INDICATION: Male, 62 years old. Abdominal/flank pain, stone suspected   TECHNIQUE: Helical CT scan examination of the  abdomen and pelvis is performed from the domes of the diaphragm to the pubic symphysis. Limited evaluation of the solid organs due to lack of intravenous contrast. Unless otherwise specified, incidental thyroid, adrenal, renal lesions do not require dedicated imaging follow up. Additionally, any mentioned pulmonary nodules do not require dedicated imaging follow-up based on the Fleischner guidelines unless otherwise specified. Coronary calcifications are not identified unless otherwise specified.   COMPARISON: 07/22/2022   FINDINGS:   There are minimal atelectatic changes in the lung bases. The heart is normal in size. There are coronary calcifications. The liver appears normal. The gallbladder is normal. The spleen is normal. Pancreas appears normal. The adrenals are normal. Left renal cyst is seen. Nonobstructing right intrarenal stones measuring up to 10 mm. There are stones in the proximal right ureter measuring 2 mm and 3 mm without obstruction.   The abdominal aorta is normal in caliber. Scattered calcified atherosclerotic changes are present. Bladder is normal. The prostate appears borderline enlarged. There  is colonic diverticulosis. There is a moderate size fat-containing supra umbilical hernia with additional large small bowel containing supraumbilical hernia and additional moderate size periumbilical hernia containing small bowel. There is no free fluid or lymphadenopathy. There are degenerative changes of the spine and bony pelvis.   IMPRESSION:   Stones in the proximal right ureter measuring 2 mm and 3 mm without obstruction.   Other nonobstructing right intrarenal stones measuring up to 10 mm.   Additional findings as above.   DOSE REDUCTION: This exam was performed according to our departmental dose-optimization program which includes automated exposure control, adjustment of the mA and/or kV according to patient size and/or use of iterative reconstruction technique.   Electronically signed by: [place] Engel MD 07/28/2023 05:52 PM EDT RP Workstation: [id number] I have independently reviewed the films.  See HPI.    Assessment & Plan:    1. Right nephrolithiasis - UA w/ pyuria, hematuria and bacteruria  - Urine culture pending - explained to the patient the ureteroscopy is needed to treat his stone burden, he was leaning toward ESWL, but I explained with the 2 stones in his ureter, we would need confirmation that he passes these otherwise if we address the right renal stone with ESWL, he would have a higher potential of experiencing [place]strasse - I explained how the procedure is performed and the risks involved -informed the patient that they will have an ureteral stent, which will remain in place for approximately 3-10 days and can be associated with flank pain, bladder pain, dysuria, urgency, frequency, urinary leakage, and gross hematuria. - stent may be removed in the office with a cystoscope or patient may be instructed to remove the stent themselves by the string and that is decided on the day of the procedure - residual stones within the kidney or ureter may be present after  the procedure and may need to have these addressed at a different encounter - injury to the ureter is the most common intra-operative risk, it may result in an open procedure to correct the defect - infection and bleeding are also risks - explained the risks of general anesthesia, such as: MI, CVA, paralysis, coma and/or death. - advised to contact our office or seek treatment in the ED if becomes febrile or pain/ vomiting are difficult control in order to arrange for emergent/urgent intervention - schedule right ureteroscopy with laser lithotripsy with stent placement    2. BPH with urinary obstruction  -s/p TURP   Return for  right  URS/LL/ureteral stent placement.    These notes generated with voice recognition software. I apologize for typographical errors.  Matilde Son, PA-C  [facility] [address]  [address] [place], [place] [address] [phone]   Surgical Physician Order Form [facility]  * Scheduling expectation : Next Available; he is a Dr. Ace Holder patient, but she may not have any availability   *Length of Case:   *Clearance needed: no  *Anticoagulation Instructions: May continue all anticoagulants  *Aspirin  Instructions: Ok to continue Aspirin   *Post-op visit Date/Instructions:  TBD  *Diagnosis: Right Nephrolithiasis and right ureteral stone   *Procedure: right Ureteroscopy w/laser lithotripsy & stent placement ([id number])   Additional orders: N/A  -Admit type: OUTpatient  -Anesthesia: General  -VTE Prophylaxis Standing Order SCD's       Other:   -Standing Lab Orders Per Anesthesia    Lab other: None  -Standing Test orders EKG/Chest x-ray per Anesthesia       Test other:   - Medications:  Ancef  2gm IV  -Other orders:  N/A

## 2023-08-01 NOTE — H&P (View-Only) (Signed)
 08/04/2023 10:51 AM   Joe Dunlap Feb 12, 1961 [id number]  Referring provider: Lorrie Rothman, MD [address] [place],  [place] [address]  Urological history  BPH with LUTS  -Urolift (2021) -Cystoscopy (08/08/21) - hypervascularity- loss of anterior channel  -cysto (12/2022) - Normal prostate with evidence of anterior channel along with UroLift clips present creating this defect  -TURP (06/2023) - prostate chips negative for malignancy    2. Bilateral kidney stones  -stone composition of 70% calcium oxalate monohydrate, 27% calcium oxalate dihydrate and 3% calcium phosphate  -2018 s/p ESWL x 1 in remote past.  S/p sucessful right ESWL for a 6 mm proximal ureteral calculus on 2/18 -CT renal stone study (07/22/2021) -large stone within the inferior pole of the left kidney measuring 11 mm. Multiple 2-5 mm stones throughout the right kidney. Urinary bladder is unremarkable.  -CT renal stone study (07/2022) - 1.4 cm calculus within the left renal pelvis with associated mild dilatation and wall thickening of the left renal pelvis and mild surrounding inflammatory change -left ESWL    4. Erectile dysfunction -sildenafil  20 mg, on-demand-dosing  Chief Complaint  Patient presents with   Other   HPI: Joe Dunlap is a 63 y.o. man who presents today for discussion for definitive stone treatment.   Previous records reviewed.   He has not had any flank pain recently.  He denied any passions or fragments.  Patient denies any modifying or aggravating factors.  Patient denies any recent UTI's, gross hematuria, dysuria or suprapubic/flank pain.  Patient denies any fevers, chills, nausea or vomiting.    CT renal stone study (07/2023) - Stones in the proximal right ureter measuring 2 mm and 3 mm without obstruction.  Other nonobstructing right intrarenal stones measuring up to 10 mm.  UA yellow slightly cloudy, specific gravity 1.015, pH 7.0, 1+ protein, 3+ blood, 2+ leukocyte,  greater than 30 WBCs, greater than 30 RBCs, 0-10 epithelial cells and moderate bacteria.  PMH: Past Medical History:  Diagnosis Date   Anginal pain (HCC)    Aortic atherosclerosis (HCC)    Benign prostatic hyperplasia with urinary obstruction    Bilateral carotid artery disease (HCC)    Coronary artery disease    DDD (degenerative disc disease), lumbar    GERD (gastroesophageal reflux disease)    Headache    Hepatic steatosis    Hyperlipemia    Hypertension    Impaired glucose tolerance    Kidney stones 2018   Labral tear of hip, degenerative    Lumbar radiculitis    Obesity (BMI 30-39.9)    OSA on CPAP    Shingles    Stomach ulcer    Ventral hernia     Surgical History: Past Surgical History:  Procedure Laterality Date   COLONOSCOPY WITH PROPOFOL  N/A 02/11/2018   Procedure: COLONOSCOPY WITH PROPOFOL ;  Surgeon: Deveron Fly, MD;  Location: [facility];  Service: Endoscopy;  Laterality: N/A;   CYSTOSCOPY WITH INSERTION OF UROLIFT N/A 08/14/2019   Procedure: CYSTOSCOPY WITH INSERTION OF UROLIFT;  Surgeon: Dustin Gimenez, MD;  Location: [facility] ORS;  Service: Urology;  Laterality: N/A;   CYSTOSCOPY WITH INSERTION OF UROLIFT N/A 09/08/2021   Procedure: CYSTOSCOPY WITH INSERTION OF UROLIFT;  Surgeon: Dustin Gimenez, MD;  Location: [facility] ORS;  Service: Urology;  Laterality: N/A;   EXTRACORPOREAL SHOCK WAVE LITHOTRIPSY Right 06/25/2016   Procedure: EXTRACORPOREAL SHOCK WAVE LITHOTRIPSY (ESWL);  Surgeon: Dustin Gimenez, MD;  Location: [facility] ORS;  Service: Urology;  Laterality: Right;  EXTRACORPOREAL SHOCK WAVE LITHOTRIPSY Right 08/13/2022   Procedure: EXTRACORPOREAL SHOCK WAVE LITHOTRIPSY (ESWL);  Surgeon: Lawerence Pressman, MD;  Location: [facility] ORS;  Service: Urology;  Laterality: Right;   GASTRIC ULCER REPAIR     KNEE SURGERY Left    LEFT HEART CATH AND CORONARY ANGIOGRAPHY Left 05/11/2018   Procedure: LEFT HEART CATH AND CORONARY ANGIOGRAPHY;  Surgeon: Percival Brace, MD;   Location: [facility] INVASIVE CV LAB;  Service: Cardiovascular;  Laterality: Left;   TONSILLECTOMY     TRANSURETHRAL RESECTION OF PROSTATE N/A 06/14/2023   Procedure: TURP (TRANSURETHRAL RESECTION OF PROSTATE);  Surgeon: Dustin Gimenez, MD;  Location: [facility] ORS;  Service: Urology;  Laterality: N/A;   UPPER GI ENDOSCOPY  2008   stomach ulcer   UVULOPALATOPHARYNGOPLASTY (UPPP)/TONSILLECTOMY/SEPTOPLASTY  2000    Home Medications:  Allergies as of 08/04/2023       Reactions   Nsaids Other (See Comments)   Hx of ulcer        Medication List        Accurate as of August 04, 2023 10:51 AM. If you have any questions, ask your nurse or doctor.          amLODipine 10 MG tablet Commonly known as: NORVASC Take 10 mg by mouth daily.   GAS-X PO Take 1 tablet by mouth 2 (two) times daily.   isosorbide mononitrate 30 MG 24 hr tablet Commonly known as: IMDUR Take 30 mg by mouth daily.   metoprolol succinate 25 MG 24 hr tablet Commonly known as: TOPROL-XL Take 12.5 mg by mouth daily.   omeprazole 20 MG tablet Commonly known as: PRILOSEC OTC Take 20 mg by mouth daily.   pravastatin 40 MG tablet Commonly known as: PRAVACHOL Take 40 mg by mouth at bedtime.        Allergies:  Allergies  Allergen Reactions   Nsaids Other (See Comments)    Hx of ulcer    Family History: Family History  Problem Relation Age of Onset   Heart attack Father    Prostatitis Neg Hx    Prostate cancer Neg Hx    Kidney cancer Neg Hx    Bladder Cancer Neg Hx     Social History:  reports that he has never smoked. He has never used smokeless tobacco. He reports that he does not currently use alcohol after a past usage of about 5.0 standard drinks of alcohol per week. He reports that he does not use drugs.  ROS: Pertinent ROS in HPI  Physical Exam: BP 124/82   Pulse 80   Ht 5' 10 (1.778 m)   Wt 252 lb (114.3 kg)   BMI 36.16 kg/m   Constitutional:  Well nourished. Alert and oriented, No acute  distress. HEENT: [place] AT, moist mucus membranes.  Trachea midline Cardiovascular: No clubbing, cyanosis, or edema. Respiratory: Normal respiratory effort, no increased work of breathing. Neurologic: Grossly intact, no focal deficits, moving all 4 extremities. Psychiatric: Normal mood and affect.   Laboratory Data: Lab Results  Component Value Date   WBC 6.6 06/07/2023   HGB 14.8 06/07/2023   HCT 42.4 06/07/2023   MCV 88.9 06/07/2023   PLT 203 06/07/2023    Lab Results  Component Value Date   CREATININE 0.89 06/07/2023   Urinalysis See EPIC and HPI  I have reviewed the labs.   Pertinent Imaging: EXAMINATION: CT RENAL STONE STUDY   CLINICAL INDICATION: Male, 62 years old. Abdominal/flank pain, stone suspected   TECHNIQUE: Helical CT scan examination of the  abdomen and pelvis is performed from the domes of the diaphragm to the pubic symphysis. Limited evaluation of the solid organs due to lack of intravenous contrast. Unless otherwise specified, incidental thyroid, adrenal, renal lesions do not require dedicated imaging follow up. Additionally, any mentioned pulmonary nodules do not require dedicated imaging follow-up based on the Fleischner guidelines unless otherwise specified. Coronary calcifications are not identified unless otherwise specified.   COMPARISON: 07/22/2022   FINDINGS:   There are minimal atelectatic changes in the lung bases. The heart is normal in size. There are coronary calcifications. The liver appears normal. The gallbladder is normal. The spleen is normal. Pancreas appears normal. The adrenals are normal. Left renal cyst is seen. Nonobstructing right intrarenal stones measuring up to 10 mm. There are stones in the proximal right ureter measuring 2 mm and 3 mm without obstruction.   The abdominal aorta is normal in caliber. Scattered calcified atherosclerotic changes are present. Bladder is normal. The prostate appears borderline enlarged. There  is colonic diverticulosis. There is a moderate size fat-containing supra umbilical hernia with additional large small bowel containing supraumbilical hernia and additional moderate size periumbilical hernia containing small bowel. There is no free fluid or lymphadenopathy. There are degenerative changes of the spine and bony pelvis.   IMPRESSION:   Stones in the proximal right ureter measuring 2 mm and 3 mm without obstruction.   Other nonobstructing right intrarenal stones measuring up to 10 mm.   Additional findings as above.   DOSE REDUCTION: This exam was performed according to our departmental dose-optimization program which includes automated exposure control, adjustment of the mA and/or kV according to patient size and/or use of iterative reconstruction technique.   Electronically signed by: [place] Engel MD 07/28/2023 05:52 PM EDT RP Workstation: [id number] I have independently reviewed the films.  See HPI.    Assessment & Plan:    1. Right nephrolithiasis - UA w/ pyuria, hematuria and bacteruria  - Urine culture pending - explained to the patient the ureteroscopy is needed to treat his stone burden, he was leaning toward ESWL, but I explained with the 2 stones in his ureter, we would need confirmation that he passes these otherwise if we address the right renal stone with ESWL, he would have a higher potential of experiencing [place] - I explained how the procedure is performed and the risks involved -informed the patient that they will have an ureteral stent, which will remain in place for approximately 3-10 days and can be associated with flank pain, bladder pain, dysuria, urgency, frequency, urinary leakage, and gross hematuria. - stent may be removed in the office with a cystoscope or patient may be instructed to remove the stent themselves by the string and that is decided on the day of the procedure - residual stones within the kidney or ureter may be present after  the procedure and may need to have these addressed at a different encounter - injury to the ureter is the most common intra-operative risk, it may result in an open procedure to correct the defect - infection and bleeding are also risks - explained the risks of general anesthesia, such as: MI, CVA, paralysis, coma and/or death. - advised to contact our office or seek treatment in the ED if becomes febrile or pain/ vomiting are difficult control in order to arrange for emergent/urgent intervention - schedule right ureteroscopy with laser lithotripsy with stent placement    2. BPH with urinary obstruction  -s/p TURP   Return for  right  URS/LL/ureteral stent placement.    These notes generated with voice recognition software. I apologize for typographical errors.  Matilde Son, PA-C  [facility] [address]  [address] [place], [place] [address] [phone]   Surgical Physician Order Form [facility]  * Scheduling expectation : Next Available; he is a Dr. Ace Holder patient, but she may not have any availability   *Length of Case:   *Clearance needed: no  *Anticoagulation Instructions: May continue all anticoagulants  *Aspirin  Instructions: Ok to continue Aspirin   *Post-op visit Date/Instructions:  TBD  *Diagnosis: Right Nephrolithiasis and right ureteral stone   *Procedure: right Ureteroscopy w/laser lithotripsy & stent placement ([id number])   Additional orders: N/A  -Admit type: OUTpatient  -Anesthesia: General  -VTE Prophylaxis Standing Order SCD's       Other:   -Standing Lab Orders Per Anesthesia    Lab other: None  -Standing Test orders EKG/Chest x-ray per Anesthesia       Test other:   - Medications:  Ancef  2gm IV  -Other orders:  N/A

## 2023-08-04 ENCOUNTER — Ambulatory Visit: Admitting: Urology

## 2023-08-04 ENCOUNTER — Encounter: Payer: Self-pay | Admitting: Urology

## 2023-08-04 ENCOUNTER — Other Ambulatory Visit: Payer: Self-pay

## 2023-08-04 VITALS — BP 124/82 | HR 80 | Ht 70.0 in | Wt 252.0 lb

## 2023-08-04 DIAGNOSIS — N200 Calculus of kidney: Secondary | ICD-10-CM

## 2023-08-04 DIAGNOSIS — N201 Calculus of ureter: Secondary | ICD-10-CM

## 2023-08-04 DIAGNOSIS — N401 Enlarged prostate with lower urinary tract symptoms: Secondary | ICD-10-CM

## 2023-08-04 LAB — URINALYSIS, COMPLETE
Bilirubin, UA: NEGATIVE
Glucose, UA: NEGATIVE
Ketones, UA: NEGATIVE
Nitrite, UA: NEGATIVE
Specific Gravity, UA: 1.015 (ref 1.005–1.030)
Urobilinogen, Ur: 0.2 mg/dL (ref 0.2–1.0)
pH, UA: 7 (ref 5.0–7.5)

## 2023-08-04 LAB — MICROSCOPIC EXAMINATION
RBC, Urine: >30 /HPF — AB (ref 0–2)
WBC, UA: >30 /HPF — AB (ref 0–5)

## 2023-08-04 NOTE — Progress Notes (Unsigned)
 Surgical Physician Order Form [facility] [place]  * Scheduling expectation : Next Available; he is a Dr. Ace Holder patient, but she may not have any availability   *Length of Case:   *Clearance needed: no  *Anticoagulation Instructions: May continue all anticoagulants  *Aspirin  Instructions: Ok to continue Aspirin   *Post-op visit Date/Instructions:   TBD  *Diagnosis: Right Nephrolithiasis and right ureteral stone   *Procedure: right Ureteroscopy w/laser lithotripsy & stent placement (13244)   Additional orders: N/A  -Admit type: OUTpatient  -Anesthesia: General  -VTE Prophylaxis Standing Order SCD's       Other:   -Standing Lab Orders Per Anesthesia    Lab other: None; UA and UCX done today   -Standing Test orders EKG/Chest x-ray per Anesthesia       Test other:   - Medications:  Ancef  2gm IV  -Other orders:  N/A

## 2023-08-05 ENCOUNTER — Telehealth: Payer: Self-pay

## 2023-08-05 NOTE — Telephone Encounter (Signed)
 Per Dr. Cherylene Corrente, Patient is to be scheduled for Right Ureteroscopy with Laser Lithotripsy and Stent Placement   Joe Dunlap was contacted and possible surgical dates were discussed, Tuesday June 24th, 2025 was agreed upon for surgery.   Patient was directed to call [phone] between 1-3pm the day before surgery to find out surgical arrival time.  Instructions were given not to eat or drink from midnight on the night before surgery and have a driver for the day of surgery. On the surgery day patient was instructed to enter through the Medical Mall entrance of [facility] report the Same Day Surgery desk.   Pre-Admit Testing will be in contact via phone to set up an interview with the anesthesia team to review your history and medications prior to surgery.   Reminder of this information was sent via MyChart to the patient.

## 2023-08-05 NOTE — Progress Notes (Signed)
   [facility] Surgical Posting Form  Surgery Date: Date: 08/24/2023  Surgeon: Dr. Darlynn Elam, MD  Inpt ( No  )   Outpt (Yes)   Obs ( No  )   Diagnosis: N20.0 Right Nephrolithiasis, N20.1 Right Ureteral Stone  -CPT: [phone]  Surgery: Right Ureteroscopy with Laser Lithotripsy and Stent Placement  Stop Anticoagulations: No, May continue all  Cardiac/Medical/Pulmonary Clearance needed: no  *Orders entered into EPIC  Date: 08/05/23   *Case booked in [place]  Date: 08/05/23  *Notified pt of Surgery: Date: 08/05/23  PRE-OP UA & CX: yes, obtained in clinic today 08/04/2023  *Placed into Prior Authorization Work [place] Date: 08/05/23  Assistant/laser/rep:No

## 2023-08-09 ENCOUNTER — Ambulatory Visit: Payer: Self-pay | Admitting: Urology

## 2023-08-09 DIAGNOSIS — N390 Urinary tract infection, site not specified: Secondary | ICD-10-CM

## 2023-08-09 LAB — CULTURE, URINE COMPREHENSIVE

## 2023-08-09 MED ORDER — CIPROFLOXACIN HCL 500 MG PO TABS
500.0000 mg | ORAL_TABLET | Freq: Two times a day (BID) | ORAL | 0 refills | Status: AC
Start: 2023-08-09 — End: 2023-08-25

## 2023-08-16 ENCOUNTER — Other Ambulatory Visit: Payer: Self-pay

## 2023-08-16 ENCOUNTER — Encounter
Admission: RE | Admit: 2023-08-16 | Discharge: 2023-08-16 | Disposition: A | Discharge status: Home / Self Care | Source: Ambulatory Visit | Attending: Urology | Admitting: Urology

## 2023-08-16 VITALS — Ht 70.0 in | Wt 252.0 lb

## 2023-08-16 DIAGNOSIS — I10 Essential (primary) hypertension: Secondary | ICD-10-CM

## 2023-08-16 DIAGNOSIS — Z01810 Encounter for preprocedural cardiovascular examination: Secondary | ICD-10-CM

## 2023-08-16 NOTE — Patient Instructions (Addendum)
 Your procedure is scheduled on:  TUESDAY JUNE 24  Report to the Registration Desk on the 1st floor of the [facility] To find out your arrival time, please call [phone] between 1PM - 3PM on:  MONDAY JUNE 23  If your arrival time is 6:00 am, do not arrive before that time as the Medical Mall entrance doors do not open until 6:00 am.  REMEMBER: Instructions that are not followed completely may result in serious medical risk, up to and including death; or upon the discretion of your surgeon and anesthesiologist your surgery may need to be rescheduled.  Do not eat food after midnight the night before surgery.  No gum chewing or hard candies.  One week prior to surgery:TUESDAY JUNE 17   Stop Anti-inflammatories (NSAIDS) such as Advil, Aleve, Ibuprofen, Motrin, Naproxen, Naprosyn and Aspirin  based products such as Excedrin, Goody's Powder, BC Powder. Stop ANY OVER THE COUNTER supplements until after surgery. Ascorbic Acid (VITAMIN C PO)  CINNAMON PO  Coenzyme Q10  CRANBERRY PO  Multiple Vitamin (MULTIVITAMIN WITH MINERALS)  Multiple Vitamins-Minerals (HEALTHY EYES/LUTEIN-ZEAXANTHIN)  vitamin B-12 (CYANOCOBALAMIN   You may however, continue to take Tylenol  if needed for pain up until the day of surgery.  Continue taking all of your other prescription medications up until the day of surgery.  ON THE DAY OF SURGERY ONLY TAKE THESE MEDICATIONS WITH SIPS OF WATER:  metoprolol succinate (TOPROL-XL ) omeprazole (PRILOSEC OTC)   No Alcohol for 24 hours before or after surgery.   Do not use any recreational drugs for at least a week (preferably 2 weeks) before your surgery.  Please be advised that the combination of cocaine and anesthesia may have negative outcomes, up to and including death. If you test positive for cocaine, your surgery will be cancelled.  On the morning of surgery brush your teeth with toothpaste and water, you may rinse your mouth with mouthwash if you wish. Do  not swallow any toothpaste or mouthwash.  Do not wear jewelry, make-up, hairpins, clips or nail polish.  For welded (permanent) jewelry: bracelets, anklets, waist bands, etc.  Please have this removed prior to surgery.  If it is not removed, there is a chance that hospital personnel will need to cut it off on the day of surgery.  Do not wear lotions, powders, or perfumes.   Do not shave body hair from the neck down 48 hours before surgery.  Do not bring valuables to the hospital. [facility] is not responsible for any missing/lost belongings or valuables.   Notify your doctor if there is any change in your medical condition (cold, fever, infection).  Wear comfortable clothing (specific to your surgery type) to the hospital.  After surgery, you can help prevent lung complications by doing breathing exercises.  Take deep breaths and cough every 1-2 hours.   If you are being discharged the day of surgery, you will not be allowed to drive home. You will need a responsible individual to drive you home and stay with you for 24 hours after surgery.   If you are taking public transportation, you will need to have a responsible individual with you.  Please call the Pre-admissions Testing Dept. at [phone] if you have any questions about these instructions.  Surgery Visitation Policy:  Patients having surgery or a procedure may have two visitors.  Children under the age of 18 must have an adult with them who is not the patient.

## 2023-08-17 ENCOUNTER — Encounter
Admission: RE | Admit: 2023-08-17 | Discharge: 2023-08-17 | Discharge status: Home / Self Care | Source: Ambulatory Visit | Attending: Urology | Admitting: Urology

## 2023-08-17 DIAGNOSIS — I10 Essential (primary) hypertension: Secondary | ICD-10-CM | POA: Diagnosis not present

## 2023-08-17 DIAGNOSIS — Z01810 Encounter for preprocedural cardiovascular examination: Secondary | ICD-10-CM | POA: Insufficient documentation

## 2023-08-23 ENCOUNTER — Encounter: Payer: Self-pay | Admitting: Urology

## 2023-08-23 DIAGNOSIS — Z029 Encounter for administrative examinations, unspecified: Secondary | ICD-10-CM

## 2023-08-23 NOTE — Progress Notes (Signed)
 Perioperative / Anesthesia Services  Pre-Admission Testing Clinical Review / Pre-Operative Anesthesia Consult  Date: 08/23/23  PATIENT DEMOGRAPHICS: Name: Joe Dunlap DOB: [birth year] MRN:   [id number]  Note: Available PAT nursing documentation and vital signs have been reviewed. Clinical nursing staff has updated patient's PMH/PSHx, current medication list, and drug allergies/intolerances to ensure complete and comprehensive history available to assist care teams in MDM as it pertains to the aforementioned surgical procedure and anticipated anesthetic course. Extensive review of available clinical information personally performed. [place] PMH and PSHx updated with any diagnoses/procedures that  may have been inadvertently omitted during his intake with the pre-admission testing department's nursing staff.  PLANNED SURGICAL PROCEDURE(S):   Case: [id number] Date/Time: 08/24/23 0715   Procedure: CYSTOSCOPY/URETEROSCOPY/HOLMIUM LASER/STENT PLACEMENT (Right)   Anesthesia type: General   Diagnosis:      Right nephrolithiasis [N20.0]     Right ureteral stone [N20.1]   Pre-op diagnosis: Right Nephrolithiasis, Right Ureteral Stone   Location: [facility] OR ROOM 10 / [facility]   Surgeons: Twylla Glendia BROCKS, MD        CLINICAL DISCUSSION: Joe Dunlap is a 63 y.o. male who is submitted for pre-surgical anesthesia review and clearance prior to him undergoing the above procedure.  Patient has never been a smoker in the past. Pertinent PMH includes: CAD, aortic atherosclerosis, angina, BILATERAL carotid artery disease, HTN, HLD, OSAH (requires nocturnal PAP therapy), GERD (on daily PPI), peptic ulcer, large ventral hernia, BPH s/p TURP, nephrolithiasis, lumbar radiculitis, degenerative changes in the spine.   Patient is followed by cardiology Philippe, MD). He was last seen in the cardiology clinic on 03/29/2023; notes reviewed. At the time of his clinic visit, patient  complaining of intermittent episodes of chest pain that he described as a tightness/pressure sensation.  Symptoms lasts for 1-2 hours and was reported to be self-limiting.  Symptom experienced in the setting of stress while at work.  He had no associated shortness of breath.  Patient denied PND, orthopnea, palpitations, significant peripheral edema, weakness, fatigue, vertiginous symptoms, or presyncope/syncope. Patient with a past medical history significant for cardiovascular diagnoses. Documented physical exam was grossly benign, providing no evidence of acute exacerbation and/or decompensation of the patient's known cardiovascular conditions.   Patient underwent diagnostic LEFT heart catheterization on 05/11/2018 revealing multivessel CAD; 60% proximal LCx, 30% ostial - proximal CX, 30% ostial D1, 75% acute marginal, and 30% distal RCA.  Interventional cardiology made the decision to defer intervention opting for aggressive medical management.   Most recent TTE performed on 07/23/2022 revealed a normal left ventricular systolic function with an EF of >55% %.  There were no regional wall motion abnormalities.  Left ventricular diastolic Doppler parameters were normal.  Right ventricular size and function normal. There was trivial to mild pan valvular regurgitation. All transvalvular gradients were noted to be normal providing no evidence suggestive of valvular stenosis. Aorta normal in size with no evidence of ectasia or aneurysmal dilatation.   Most recent myocardial perfusion imaging study was performed on 07/30/2022 revealing a  normal left ventricular systolic function with an EF of 63%.  There were no regional wall motion abnormalities.  No artifact or left ventricular cavity size enlargement appreciated on review of imaging. SPECT images demonstrated no evidence of stress-induced myocardial ischemia or arrhythmia; no scintigraphic evidence of scar.  TID ratio = 0.91. Study determined to be normal and  low risk.  Blood pressure well controlled at 132/74 mmHg on currently prescribed CCB (amlodipine), nitrate (  isosorbide mononitrate), and beta-blocker (metoprolol succinate) therapies.  Patient is on pravastatin for his HLD diagnosis and ASCVD prevention. Patient is not diabetic.  Patient does have an OSAH diagnosis and is reported be compliant with prescribed nocturnal PAP therapy. Patient is able to complete all of his  ADL/IADLs without cardiovascular limitation.  Per the [facility], patient is able to achieve at least 4 METS of physical activity without experiencing any significant degree of angina/anginal equivalent symptoms.  No changes were made to his medication regimen during his visit with cardiology.  Patient scheduled to follow-up with outpatient cardiology in 2 months or sooner if needed.   Joe Dunlap is scheduled for an elective CYSTOSCOPY/URETEROSCOPY/HOLMIUM LASER/STENT PLACEMENT (Right) on 08/24/2023 with Dr. Glendia Barba, MD.  Given patient's past medical history significant for cardiovascular diagnoses, presurgical cardiac clearance was sought by the PAT team. Per cardiology, this patient is optimized for surgery and may proceed with the planned procedural course with a LOW risk of significant perioperative cardiovascular complications.  In review of his medication reconciliation, the patient is not noted to be taking any type of anticoagulation or antiplatelet therapies that would need to be held during his perioperative course.  Patient denies previous perioperative complications with anesthesia in the past. In review his EMR, it is noted that patient underwent a general anesthetic course here at [facility] ([facility] in 06/14/2023 without documented complications.   MOST RECENT VITAL SIGNS:    08/16/2023   12:47 PM 08/04/2023   10:18 AM 07/20/2023    9:52 AM  Vitals with BMI  Height 5' 10 5' 10 5' 10  Weight 252 lbs 252 lbs 252 lbs  BMI  36.16 36.16 36.16  Systolic  124 148  Diastolic  82 76  Pulse  80 82   PROVIDERS/SPECIALISTS: NOTE: Primary physician provider listed below. Patient may have been seen by APP or partner within same practice.   PROVIDER ROLE / SPECIALTY LAST SHERLEAN Barba Glendia JAYSON, MD Urology (Surgeon) 08/04/2023  Jeffie Cheryl BRAVO, MD Primary Care Provider 01/04/2023  Florencio Shine, MD Cardiology 03/29/2023   ALLERGIES: Allergies  Allergen Reactions   Nsaids Other (See Comments)    Hx of ulcer    CURRENT HOME MEDICATIONS: No current facility-administered medications for this encounter.    amLODipine (NORVASC) 10 MG tablet   ciprofloxacin  (CIPRO ) 500 MG tablet   isosorbide mononitrate (IMDUR) 30 MG 24 hr tablet   Menthol-Methyl Salicylate (SALONPAS PAIN RELIEF PATCH EX)   metoprolol succinate (TOPROL-XL) 25 MG 24 hr tablet   pravastatin (PRAVACHOL) 40 MG tablet   Ascorbic Acid (VITAMIN C PO)   CINNAMON PO   Coenzyme Q10 (COQ10 PO)   CRANBERRY PO   Multiple Vitamin (MULTIVITAMIN WITH MINERALS) TABS tablet   Multiple Vitamins-Minerals (HEALTHY EYES/LUTEIN-ZEAXANTHIN) CAPS   omeprazole (PRILOSEC OTC) 20 MG tablet   vitamin B-12 (CYANOCOBALAMIN) 500 MCG tablet   HISTORY: Past Medical History:  Diagnosis Date   Anginal pain (HCC)    Aortic atherosclerosis (HCC)    Benign prostatic hyperplasia with urinary obstruction    a.) s/p TURP 06/2023   Bilateral carotid artery disease (HCC)    Coronary artery disease    DDD (degenerative disc disease), lumbar    Degeneration of spine    Diverticulosis    GERD (gastroesophageal reflux disease)    Headache    Hepatic steatosis    Hyperlipemia    Hypertension    Impaired glucose tolerance    Kidney stones  2018   Labral tear of hip, degenerative    Lumbar radiculitis    Obesity (BMI 30-39.9)    OSA on CPAP    Periumbilical hernia    Renal cyst, left    Shingles    Stomach ulcer    Supraumbilical hernia    Ventral hernia    Past  Surgical History:  Procedure Laterality Date   COLONOSCOPY WITH PROPOFOL  N/A 02/11/2018   Procedure: COLONOSCOPY WITH PROPOFOL ;  Surgeon: Gaylyn Gladis PENNER, MD;  Location: [facility] ENDOSCOPY;  Service: Endoscopy;  Laterality: N/A;   CYSTOSCOPY WITH INSERTION OF UROLIFT N/A 08/14/2019   Procedure: CYSTOSCOPY WITH INSERTION OF UROLIFT;  Surgeon: Penne Knee, MD;  Location: [facility] ORS;  Service: Urology;  Laterality: N/A;   CYSTOSCOPY WITH INSERTION OF UROLIFT N/A 09/08/2021   Procedure: CYSTOSCOPY WITH INSERTION OF UROLIFT;  Surgeon: Penne Knee, MD;  Location: [facility] ORS;  Service: Urology;  Laterality: N/A;   EXTRACORPOREAL SHOCK WAVE LITHOTRIPSY Right 06/25/2016   Procedure: EXTRACORPOREAL SHOCK WAVE LITHOTRIPSY (ESWL);  Surgeon: Knee Penne, MD;  Location: [facility] ORS;  Service: Urology;  Laterality: Right;   EXTRACORPOREAL SHOCK WAVE LITHOTRIPSY Right 08/13/2022   Procedure: EXTRACORPOREAL SHOCK WAVE LITHOTRIPSY (ESWL);  Surgeon: Francisca Redell BROCKS, MD;  Location: [facility] ORS;  Service: Urology;  Laterality: Right;   GASTRIC ULCER REPAIR     KNEE SURGERY Left    LEFT HEART CATH AND CORONARY ANGIOGRAPHY Left 05/11/2018   Procedure: LEFT HEART CATH AND CORONARY ANGIOGRAPHY;  Surgeon: Ammon Blunt, MD;  Location: [facility] INVASIVE CV LAB;  Service: Cardiovascular;  Laterality: Left;   TONSILLECTOMY     TRANSURETHRAL RESECTION OF PROSTATE N/A 06/14/2023   Procedure: TURP (TRANSURETHRAL RESECTION OF PROSTATE);  Surgeon: Penne Knee, MD;  Location: [facility] ORS;  Service: Urology;  Laterality: N/A;   UPPER GI ENDOSCOPY  2008   stomach ulcer   UVULOPALATOPHARYNGOPLASTY (UPPP)/TONSILLECTOMY/SEPTOPLASTY  2000   Family History  Problem Relation Age of Onset   Heart attack Father    Prostatitis Neg Hx    Prostate cancer Neg Hx    Kidney cancer Neg Hx    Bladder Cancer Neg Hx    Social History   Tobacco Use   Smoking status: Never   Smokeless tobacco: Never  Substance Use Topics   Alcohol use:  Not Currently    Alcohol/week: 5.0 standard drinks of alcohol    Types: 5 Shots of liquor per week   LABS:  Lab Results  Component Value Date   WBC 6.6 06/07/2023   HGB 14.8 06/07/2023   HCT 42.4 06/07/2023   MCV 88.9 06/07/2023   PLT 203 06/07/2023   Lab Results  Component Value Date   NA 138 06/07/2023   CL 109 06/07/2023   K 3.7 06/07/2023   CO2 21 (L) 06/07/2023   BUN 14 06/07/2023   CREATININE 0.89 06/07/2023   GFRNONAA >60 06/07/2023   CALCIUM 8.8 (L) 06/07/2023   GLUCOSE 125 (H) 06/07/2023   Office Visit on 08/04/2023  Component Date Value Ref Range Status   Specific Gravity, UA 08/04/2023 1.015  1.005 - 1.030 Final   pH, UA 08/04/2023 7.0  5.0 - 7.5 Final   Color, UA 08/04/2023 Yellow  Yellow Final   Appearance Ur 08/04/2023 Slightly cloudy  Clear Final   Leukocytes,UA 08/04/2023 2+ (A)  Negative Final   Protein,UA 08/04/2023 1+ (A)  Negative/Trace Final   Glucose, UA 08/04/2023 Negative  Negative Final   Ketones, UA 08/04/2023 Negative  Negative Final   RBC,  UA 08/04/2023 3+ (A)  Negative Final   Bilirubin, UA 08/04/2023 Negative  Negative Final   Urobilinogen, Ur 08/04/2023 0.2  0.2 - 1.0 mg/dL Final   Nitrite, UA 93/95/7974 Negative  Negative Final   Microscopic Examination 08/04/2023 See below:   Final   Microscopic was indicated and was performed.   Urine Culture, Comprehensive 08/04/2023 Final report (A)   Final   Organism ID, Bacteria 08/04/2023 Enterococcus faecalis (A)   Final   Comment: Enterococci susceptible to penicillin are predictably susceptible to ampicillin , amoxicillin, ampicillin -sulbactam, amoxicillin-clavulanate, and piperacillin-tazobactam for non-beta-lactamase producing enterococci. (CLSI 2018) For Enterococcus species, aminoglycosides (except for high-level resistance screening), cephalosporins, clindamycin, and trimethoprim -sulfamethoxazole  are not effective clinically. (CLSI, M100-S26, 2016) 25,000-50,000 colony forming units per  mL    Organism ID, Bacteria 08/04/2023 Not applicable   Final   ANTIMICROBIAL SUSCEPTIBILITY 08/04/2023 Comment   Final   Comment:       ** S = Susceptible; I = Intermediate; R = Resistant **                    P = Positive; N = Negative             MICS are expressed in micrograms per mL    Antibiotic                 RSLT#1    RSLT#2    RSLT#3    RSLT#4 Ciprofloxacin                   S Levofloxacin                   S Nitrofurantoin                  S Penicillin                     S Tetracycline                   R Vancomycin                     S    WBC, UA 08/04/2023 >30 (A)  0 - 5 /hpf Final   RBC, Urine 08/04/2023 >30 (A)  0 - 2 /hpf Final   Epithelial Cells (non renal) 08/04/2023 0-10  0 - 10 /hpf Final   Bacteria, UA 08/04/2023 Moderate (A)  None seen/Few Final    ECG: Date: 08/17/2023  Time ECG obtained: 1124 AM Rate: 70 bpm Rhythm: normal sinus Axis (leads I and aVF): normal Intervals: PR 162 ms. QRS 90 ms. QTc 434 ms. ST segment and T wave changes: No evidence of acute T wave abnormalities or significant ST segment elevation or depression.  Evidence of a possible, age undetermined, prior infarct:  No Comparison: Similar to previous tracing obtained on 03/29/2023  IMAGING / PROCEDURES: CT RENAL STONE STUDY performed on 07/28/2023 Stones in the proximal right ureter measuring 2 mm and 3 mm without obstruction.  Other nonobstructing right intrarenal stones measuring up to 10 mm. Minimal atelectatic changes in the lung bases  There are coronary calcifications  Scattered calcified atherosclerotic changes are present. Left renal cyst is seen  There is colonic diverticulosis.  There is a moderate size fat-containing supra umbilical hernia with additional large small bowel containing supraumbilical hernia and additional moderate size periumbilical hernia containing small bowel.  There are degenerative changes of the spine and bony pelvis.  MYOCARDIAL PERFUSION IMAGING  STUDY (LEXISCAN) performed on 07/30/2022 Normal left ventricular systolic function with a normal LVEF of 63% Normal myocardial thickening and wall motion Left ventricular cavity size normal SPECT images demonstrate homogenous tracer distribution throughout the myocardium TID ratio = 0.91 No evidence of stress-induced myocardial ischemia or arrhythmia Normal low risk study   TRANSTHORACIC ECHOCARDIOGRAM performed on 07/23/2022 Normal left ventricular systolic function with an EF of >55% Normal left ventricular diastolic Doppler parameters Normal right ventricular systolic function Trivial MR, TR, PR Normal transvalvular gradients; no valvular stenosis No pericardial effusion   CT RENAL STONE STUDY performed on 07/22/2022 1.4 cm calculus within the left renal pelvis with associated mild dilatation and wall thickening of the left renal pelvis and mild surrounding inflammatory changes, likely intermittently obstructive with the patient erect. This calculus is visible on the scout image. Additional non obstructing bilateral renal calculi. No evidence of ureteral calculus or focal perinephric fluid collection. Stable large ventral hernia containing multiple loops of small and large bowel. No evidence of incarceration or obstruction. Hepatic steatosis. Aortic atherosclerosis   LEFT HEART CATHETERIZATION AND CORONARY ANGIOGRAPHY performed on 05/11/2018 Normal left ventricular systolic function and EF Multivessel CAD Prox Cx lesion is 60% stenosed. Ost Cx to Prox Cx lesion is 30% stenosed. Ost 1st Diag lesion is 30% stenosed. Acute Mrg lesion is 75% stenosed. Dist RCA lesion is 30% stenosed. Recommendations Medical therapy with aggressive risk factor modification    CT CORONARY W/FRACTIONAL FLOW RESERVE FLUID ANALYSIS performed on 04/25/2018 Normal origin and course of coronary arteries  Scattered atherosclerotic disease with luminal narrowing greater than 50% in the mid LAD and the  ostium of D1 and distal RCA. CADRAD 3  FFR analysis (ranges: < 0.75 high likelihood of hemodynamically significant stenosis, 0.76-0.80 borderline, > 0.80 normal): Left Main Artery:          Site of luminal narrowing:N/A          Distal vessel: N/A  Left Anterior Descending System:          Site of luminal narrowing: proximal LAD: 0.84, mid LAD: 0.78; D1: 0.74         Distal vessel: 0.62  Left Circumflex System:          Site of luminal narrowing: mid circ: 0.84          Distal vessel: 0.77  Right Coronary Artery system:          Site of luminal narrowing:distal RCA: .79          Distal vessel: 0.79    IMPRESSION AND PLAN: Joe Dunlap has been referred for pre-anesthesia review and clearance prior to him undergoing the planned anesthetic and procedural courses. Available labs, pertinent testing, and imaging results were personally reviewed by me in preparation for upcoming operative/procedural course. [facility] medical record has been updated following extensive record review and patient interview with PAT staff.   This patient has been appropriately cleared by cardiology with an overall LOW risk of patient experiencing significant perioperative cardiovascular complications. Based on clinical review performed today (08/23/23), barring any significant acute changes in the patient's overall condition, it is anticipated that he will be able to proceed with the planned surgical intervention. Any acute changes in clinical condition may necessitate his procedure being postponed and/or cancelled. Patient will meet with anesthesia team (MD and/or CRNA) on the day of his procedure for preoperative evaluation/assessment. Questions regarding anesthetic course will be fielded at that time.   Pre-surgical instructions were reviewed with the patient  during his PAT appointment, and questions were fielded to satisfaction by PAT clinical staff. He has been instructed on which medications that he will need  to hold prior to surgery, as well as the ones that have been deemed safe/appropriate to take on the day of his procedure. As part of the general education provided by PAT, patient made aware both verbally and in writing, that he would need to abstain from the use of any illegal substances during his perioperative course. He was advised that failure to follow the provided instructions could necessitate case cancellation or result in serious perioperative complications up to and including death. Patient encouraged to contact PAT and/or his surgeon's office to discuss any questions or concerns that may arise prior to surgery; verbalized understanding.   Dorise Pereyra, MSN, APRN, FNP-C, CEN [facility]  Perioperative Services Nurse Practitioner Phone: [phone] Fax: [phone] 08/23/23 12:19 PM  NOTE: This note has been prepared using Dragon dictation software. Despite my best ability to proofread, there is always the potential that unintentional transcriptional errors may still occur from this process.

## 2023-08-24 ENCOUNTER — Ambulatory Visit
Admission: RE | Admit: 2023-08-24 | Discharge: 2023-08-24 | Disposition: A | Discharge status: Home / Self Care | Source: Ambulatory Visit | Attending: Urology | Admitting: Urology

## 2023-08-24 ENCOUNTER — Ambulatory Visit: Payer: Self-pay | Admitting: Urgent Care

## 2023-08-24 ENCOUNTER — Encounter
Admission: RE | Disposition: A | Discharge status: Home / Self Care | Payer: Self-pay | Source: Ambulatory Visit | Attending: Urology

## 2023-08-24 ENCOUNTER — Encounter: Payer: Self-pay | Admitting: Urology

## 2023-08-24 ENCOUNTER — Other Ambulatory Visit: Payer: Self-pay

## 2023-08-24 ENCOUNTER — Ambulatory Visit

## 2023-08-24 DIAGNOSIS — N401 Enlarged prostate with lower urinary tract symptoms: Secondary | ICD-10-CM | POA: Insufficient documentation

## 2023-08-24 DIAGNOSIS — I10 Essential (primary) hypertension: Secondary | ICD-10-CM | POA: Insufficient documentation

## 2023-08-24 DIAGNOSIS — I779 Disorder of arteries and arterioles, unspecified: Secondary | ICD-10-CM | POA: Insufficient documentation

## 2023-08-24 DIAGNOSIS — E785 Hyperlipidemia, unspecified: Secondary | ICD-10-CM | POA: Insufficient documentation

## 2023-08-24 DIAGNOSIS — Z79899 Other long term (current) drug therapy: Secondary | ICD-10-CM | POA: Diagnosis not present

## 2023-08-24 DIAGNOSIS — N202 Calculus of kidney with calculus of ureter: Secondary | ICD-10-CM | POA: Insufficient documentation

## 2023-08-24 DIAGNOSIS — N138 Other obstructive and reflux uropathy: Secondary | ICD-10-CM | POA: Insufficient documentation

## 2023-08-24 DIAGNOSIS — I25119 Atherosclerotic heart disease of native coronary artery with unspecified angina pectoris: Secondary | ICD-10-CM | POA: Insufficient documentation

## 2023-08-24 DIAGNOSIS — I700 Atherosclerosis of aorta: Secondary | ICD-10-CM | POA: Diagnosis not present

## 2023-08-24 DIAGNOSIS — N200 Calculus of kidney: Secondary | ICD-10-CM

## 2023-08-24 DIAGNOSIS — N201 Calculus of ureter: Secondary | ICD-10-CM

## 2023-08-24 DIAGNOSIS — N529 Male erectile dysfunction, unspecified: Secondary | ICD-10-CM | POA: Insufficient documentation

## 2023-08-24 DIAGNOSIS — G4733 Obstructive sleep apnea (adult) (pediatric): Secondary | ICD-10-CM | POA: Insufficient documentation

## 2023-08-24 DIAGNOSIS — E669 Obesity, unspecified: Secondary | ICD-10-CM | POA: Insufficient documentation

## 2023-08-24 DIAGNOSIS — Z6836 Body mass index (BMI) 36.0-36.9, adult: Secondary | ICD-10-CM | POA: Diagnosis not present

## 2023-08-24 DIAGNOSIS — K219 Gastro-esophageal reflux disease without esophagitis: Secondary | ICD-10-CM | POA: Insufficient documentation

## 2023-08-24 DIAGNOSIS — N35912 Unspecified bulbous urethral stricture, male: Secondary | ICD-10-CM | POA: Insufficient documentation

## 2023-08-24 HISTORY — DX: Cyst of kidney, acquired: N28.1

## 2023-08-24 HISTORY — DX: Umbilical hernia without obstruction or gangrene: K42.9

## 2023-08-24 HISTORY — PX: CYSTOSCOPY/URETEROSCOPY/HOLMIUM LASER/STENT PLACEMENT: SHX6546

## 2023-08-24 HISTORY — DX: Spondylosis, unspecified: M47.9

## 2023-08-24 HISTORY — DX: Ventral hernia without obstruction or gangrene: K43.9

## 2023-08-24 HISTORY — DX: Diverticulosis of intestine, part unspecified, without perforation or abscess without bleeding: K57.90

## 2023-08-24 SURGERY — CYSTOSCOPY/URETEROSCOPY/HOLMIUM LASER/STENT PLACEMENT
Anesthesia: General | Site: Ureter | Laterality: Right

## 2023-08-24 MED ORDER — ONDANSETRON HCL 4 MG/2ML IJ SOLN
INTRAMUSCULAR | Status: DC | PRN
Start: 2023-08-24 — End: 2023-08-24
  Administered 2023-08-24: 4 mg via INTRAVENOUS

## 2023-08-24 MED ORDER — ACETAMINOPHEN 10 MG/ML IV SOLN: 1000.0000 mg | Freq: Once | INTRAVENOUS | Status: DC | PRN

## 2023-08-24 MED ORDER — PROPOFOL 10 MG/ML IV BOLUS
INTRAVENOUS | Status: AC
  Filled 2023-08-24: qty 40

## 2023-08-24 MED ORDER — ORAL CARE MOUTH RINSE: 15.0000 mL | Freq: Once | OROMUCOSAL | Status: AC

## 2023-08-24 MED ORDER — STERILE WATER FOR IRRIGATION IR SOLN
Status: DC | PRN
  Administered 2023-08-24: 500 mL

## 2023-08-24 MED ORDER — LIDOCAINE HCL (PF) 2 % IJ SOLN
INTRAMUSCULAR | Status: AC
  Filled 2023-08-24: qty 5

## 2023-08-24 MED ORDER — DEXMEDETOMIDINE HCL IN NACL 80 MCG/20ML IV SOLN
INTRAVENOUS | Status: DC | PRN
  Administered 2023-08-24: 8 ug via INTRAVENOUS
  Administered 2023-08-24: 4 ug via INTRAVENOUS

## 2023-08-24 MED ORDER — DEXAMETHASONE SODIUM PHOSPHATE 10 MG/ML IJ SOLN
INTRAMUSCULAR | Status: AC
  Filled 2023-08-24: qty 1

## 2023-08-24 MED ORDER — SODIUM CHLORIDE 0.9 % IR SOLN
Status: DC | PRN
  Administered 2023-08-24: 3000 mL

## 2023-08-24 MED ORDER — OXYCODONE HCL 5 MG/5ML PO SOLN: 5.0000 mg | Freq: Once | ORAL | Status: DC | PRN

## 2023-08-24 MED ORDER — HYDROCODONE-ACETAMINOPHEN 5-325 MG PO TABS: 1.0000 | ORAL_TABLET | Freq: Four times a day (QID) | ORAL | 0 refills | Status: DC | PRN

## 2023-08-24 MED ORDER — PROPOFOL 10 MG/ML IV BOLUS
INTRAVENOUS | Status: DC | PRN
Start: 2023-08-24 — End: 2023-08-24
  Administered 2023-08-24: 170 mg via INTRAVENOUS

## 2023-08-24 MED ORDER — EPHEDRINE 5 MG/ML INJ
INTRAVENOUS | Status: AC
  Filled 2023-08-24: qty 5

## 2023-08-24 MED ORDER — DEXAMETHASONE SODIUM PHOSPHATE 10 MG/ML IJ SOLN
INTRAMUSCULAR | Status: DC | PRN
  Administered 2023-08-24: 4 mg via INTRAVENOUS

## 2023-08-24 MED ORDER — ONDANSETRON HCL 4 MG/2ML IJ SOLN
INTRAMUSCULAR | Status: AC
  Filled 2023-08-24: qty 2

## 2023-08-24 MED ORDER — TAMSULOSIN HCL 0.4 MG PO CAPS: 0.4000 mg | ORAL_CAPSULE | Freq: Every day | ORAL | 0 refills | Status: DC

## 2023-08-24 MED ORDER — ACETAMINOPHEN 10 MG/ML IV SOLN
INTRAVENOUS | Status: DC | PRN
  Administered 2023-08-24: 1000 mg via INTRAVENOUS

## 2023-08-24 MED ORDER — MIDAZOLAM HCL 2 MG/2ML IJ SOLN
INTRAMUSCULAR | Status: AC
  Filled 2023-08-24: qty 2

## 2023-08-24 MED ORDER — FENTANYL CITRATE (PF) 100 MCG/2ML IJ SOLN
INTRAMUSCULAR | Status: DC | PRN
  Administered 2023-08-24 (×2): 50 ug via INTRAVENOUS

## 2023-08-24 MED ORDER — ACETAMINOPHEN 10 MG/ML IV SOLN
INTRAVENOUS | Status: AC
  Filled 2023-08-24: qty 100

## 2023-08-24 MED ORDER — PHENYLEPHRINE 80 MCG/ML (10ML) SYRINGE FOR IV PUSH (FOR BLOOD PRESSURE SUPPORT)
PREFILLED_SYRINGE | INTRAVENOUS | Status: AC
  Filled 2023-08-24: qty 10

## 2023-08-24 MED ORDER — EPHEDRINE SULFATE (PRESSORS) 50 MG/ML IJ SOLN: INTRAMUSCULAR | Status: DC | PRN

## 2023-08-24 MED ORDER — LIDOCAINE HCL (CARDIAC) PF 100 MG/5ML IV SOSY
PREFILLED_SYRINGE | INTRAVENOUS | Status: DC | PRN
Start: 2023-08-24 — End: 2023-08-24
  Administered 2023-08-24: 100 mg via INTRAVENOUS

## 2023-08-24 MED ORDER — CHLORHEXIDINE GLUCONATE 0.12 % MT SOLN
15.0000 mL | Freq: Once | OROMUCOSAL | Status: AC
  Administered 2023-08-24: 15 mL via OROMUCOSAL

## 2023-08-24 MED ORDER — KETAMINE HCL 50 MG/5ML IJ SOSY
PREFILLED_SYRINGE | INTRAMUSCULAR | Status: DC | PRN
Start: 2023-08-24 — End: 2023-08-24
  Administered 2023-08-24: 50 mg via INTRAVENOUS

## 2023-08-24 MED ORDER — IOHEXOL 180 MG/ML  SOLN
INTRAMUSCULAR | Status: DC | PRN
  Administered 2023-08-24: 10 mL

## 2023-08-24 MED ORDER — CEFAZOLIN SODIUM-DEXTROSE 2-4 GM/100ML-% IV SOLN
2.0000 g | INTRAVENOUS | Status: AC
  Administered 2023-08-24: 2 g via INTRAVENOUS

## 2023-08-24 MED ORDER — CHLORHEXIDINE GLUCONATE 0.12 % MT SOLN
OROMUCOSAL | Status: AC
  Filled 2023-08-24: qty 15

## 2023-08-24 MED ORDER — OXYBUTYNIN CHLORIDE 5 MG PO TABS
ORAL_TABLET | ORAL | 0 refills | Status: DC
Start: 2023-08-24 — End: 2024-01-20

## 2023-08-24 MED ORDER — EPHEDRINE SULFATE-NACL 50-0.9 MG/10ML-% IV SOSY
PREFILLED_SYRINGE | INTRAVENOUS | Status: DC | PRN
  Administered 2023-08-24: 10 mg via INTRAVENOUS

## 2023-08-24 MED ORDER — FENTANYL CITRATE (PF) 100 MCG/2ML IJ SOLN
INTRAMUSCULAR | Status: AC
Start: 2023-08-24 — End: 2023-08-24
  Filled 2023-08-24: qty 2

## 2023-08-24 MED ORDER — OXYCODONE HCL 5 MG PO TABS: 5.0000 mg | ORAL_TABLET | Freq: Once | ORAL | Status: DC | PRN

## 2023-08-24 MED ORDER — LACTATED RINGERS IV SOLN
INTRAVENOUS | Status: DC
Start: 2023-08-24 — End: 2023-08-24

## 2023-08-24 MED ORDER — ROCURONIUM BROMIDE 100 MG/10ML IV SOLN
INTRAVENOUS | Status: DC | PRN
  Administered 2023-08-24 (×2): 10 mg via INTRAVENOUS
  Administered 2023-08-24: 50 mg via INTRAVENOUS

## 2023-08-24 MED ORDER — FENTANYL CITRATE (PF) 100 MCG/2ML IJ SOLN: 25.0000 ug | INTRAMUSCULAR | Status: DC | PRN

## 2023-08-24 MED ORDER — PHENYLEPHRINE 80 MCG/ML (10ML) SYRINGE FOR IV PUSH (FOR BLOOD PRESSURE SUPPORT)
PREFILLED_SYRINGE | INTRAVENOUS | Status: DC | PRN
  Administered 2023-08-24: 80 ug via INTRAVENOUS

## 2023-08-24 MED ORDER — KETAMINE HCL 50 MG/5ML IJ SOSY
PREFILLED_SYRINGE | INTRAMUSCULAR | Status: AC
  Filled 2023-08-24: qty 5

## 2023-08-24 MED ORDER — SUGAMMADEX SODIUM 200 MG/2ML IV SOLN
INTRAVENOUS | Status: DC | PRN
  Administered 2023-08-24: 300 mg via INTRAVENOUS

## 2023-08-24 MED ORDER — MIDAZOLAM HCL 2 MG/2ML IJ SOLN
INTRAMUSCULAR | Status: DC | PRN
  Administered 2023-08-24: 2 mg via INTRAVENOUS

## 2023-08-24 MED ORDER — DEXMEDETOMIDINE HCL IN NACL 80 MCG/20ML IV SOLN
INTRAVENOUS | Status: AC
Start: 2023-08-24 — End: 2023-08-24
  Filled 2023-08-24: qty 20

## 2023-08-24 MED ORDER — DROPERIDOL 2.5 MG/ML IJ SOLN: 0.6250 mg | Freq: Once | INTRAMUSCULAR | Status: DC | PRN

## 2023-08-24 MED ORDER — CEFAZOLIN SODIUM-DEXTROSE 2-4 GM/100ML-% IV SOLN
INTRAVENOUS | Status: AC
  Filled 2023-08-24: qty 100

## 2023-08-24 SURGICAL SUPPLY — 24 items
BAG DRAIN SIEMENS DORNER NS (MISCELLANEOUS) ×1 IMPLANT
BASKET DAKOTA 1.9FR 11X120 (BASKET) IMPLANT
BASKET ZERO TIP 1.9FR (BASKET) IMPLANT
BRUSH SCRUB EZ 4% CHG (MISCELLANEOUS) ×1 IMPLANT
CATH URET FLEX-TIP 2 LUMEN 10F (CATHETERS) IMPLANT
CATH URETL OPEN END 6X70 (CATHETERS) IMPLANT
CNTNR URN SCR LID CUP LEK RST (MISCELLANEOUS) IMPLANT
DRAPE UTILITY 15X26 TOWEL STRL (DRAPES) ×1 IMPLANT
Dakota with Opensure handle Lawson 118243 IMPLANT
FIBER LASER MOSES 200 DFL (Laser) IMPLANT
GLOVE BIOGEL PI IND STRL 7.5 (GLOVE) ×1 IMPLANT
GOWN STRL REUS W/ TWL LRG LVL3 (GOWN DISPOSABLE) ×1 IMPLANT
GOWN STRL REUS W/ TWL XL LVL3 (GOWN DISPOSABLE) ×1 IMPLANT
GUIDEWIRE STR DUAL SENSOR (WIRE) ×1 IMPLANT
KIT TURNOVER CYSTO (KITS) ×1 IMPLANT
PACK CYSTO AR (MISCELLANEOUS) ×1 IMPLANT
SET CYSTO W/LG BORE CLAMP LF (SET/KITS/TRAYS/PACK) ×1 IMPLANT
SHEATH NAVIGATOR HD 12/14X36 (SHEATH) IMPLANT
SOL .9 NS 3000ML IRR UROMATIC (IV SOLUTION) ×1 IMPLANT
STENT URET 6FRX24 CONTOUR (STENTS) IMPLANT
STENT URET 6FRX26 CONTOUR (STENTS) IMPLANT
SURGILUBE 2OZ TUBE FLIPTOP (MISCELLANEOUS) ×1 IMPLANT
VALVE UROSEAL ADJ ENDO (VALVE) IMPLANT
WATER STERILE IRR 500ML POUR (IV SOLUTION) ×1 IMPLANT

## 2023-08-24 NOTE — Transfer of Care (Signed)
 Immediate Anesthesia Transfer of Care Note  Patient: Joe Dunlap  Procedure(s) Performed: CYSTOSCOPY/URETEROSCOPY/HOLMIUM LASER/STENT PLACEMENT (Right: Ureter)  Patient Location: PACU  Anesthesia Type:General  Level of Consciousness: awake, alert , oriented, and patient cooperative  Airway & Oxygen Therapy: Patient Spontanous Breathing and Patient connected to face mask oxygen  Post-op Assessment: Report given to RN, Post -op Vital signs reviewed and stable, and Patient moving all extremities X 4  Post vital signs: Reviewed and stable  Last Vitals:  Vitals Value Taken Time  BP 138/69 08/24/23 09:15  Temp 36.5 C 08/24/23 09:15  Pulse 78 08/24/23 09:15  Resp 13 08/24/23 09:15  SpO2 100 % 08/24/23 09:15  Vitals shown include unfiled device data.  Last Pain:  Vitals:   08/24/23 0915  TempSrc:   PainSc: Asleep       Patent airway to PACU, breathing spontaneously on 6L O2 via FM. Pt awake, responsive and comfortable. VSS.  Complications: No notable events documented.

## 2023-08-24 NOTE — Anesthesia Postprocedure Evaluation (Signed)
 Anesthesia Post Note  Patient: Joe Dunlap  Procedure(s) Performed: CYSTOSCOPY/URETEROSCOPY/HOLMIUM LASER/STENT PLACEMENT (Right: Ureter)  Patient location during evaluation: PACU Anesthesia Type: General Level of consciousness: awake and alert Pain management: pain level controlled Vital Signs Assessment: post-procedure vital signs reviewed and stable Respiratory status: spontaneous breathing, nonlabored ventilation and respiratory function stable Cardiovascular status: blood pressure returned to baseline and stable Postop Assessment: no apparent nausea or vomiting Anesthetic complications: no   No notable events documented.   Last Vitals:  Vitals:   08/24/23 0945 08/24/23 0957  BP:  138/71  Pulse: 77   Resp: 11 18  Temp: 36.7 C (!) 36.2 C  SpO2: 97% 99%    Last Pain:  Vitals:   08/24/23 0957  TempSrc: Tympanic  PainSc: 0-No pain                 Camellia Merilee Louder

## 2023-08-24 NOTE — Interval H&P Note (Signed)
 History and Physical Interval Note:  08/24/2023 7:19 AM  Joe Dunlap  has presented today for surgery, with the diagnosis of Right Nephrolithiasis, Right Ureteral Stone.  The various methods of treatment have been discussed with the patient and family.  I discussed there is a small chance his renal calculi will not be able to be treated secondary to inability to access the collecting system with the ureteroscope secondary to anatomy.  If this were to occur a stent would be placed and he would need follow-up ureteroscopy or SWL.  After consideration of risks, benefits and other options for treatment, the patient has consented to  Procedure(s): CYSTOSCOPY/URETEROSCOPY/HOLMIUM LASER/STENT PLACEMENT (Right) as a surgical intervention.  The patient's history has been reviewed, patient examined, no change in status, stable for surgery.  I have reviewed the patient's chart and labs.  Questions were answered to the patient's satisfaction.     [name] C Sefora Tietje

## 2023-08-24 NOTE — Op Note (Signed)
 Preoperative diagnosis:  Right ureteral calculi Right nephrolithiasis  Postoperative diagnosis:  Right ureteral calculi Right nephrolithiasis  Procedure:  Cystoscopy Right ureteroscopy and stone removal Ureteroscopic laser lithotripsy Right ureteral stent placement (7F/24 cm) Right retrograde pyelography with interpretation  Surgeon: Glendia C. Amarii Bordas, M.D.  Anesthesia: General  Complications: None  Intraoperative findings:  Cystoscopy: Urethral meatus < 20 F, wide caliber bulbar urethral stricture; prostate with TUR changes, nonocclusive.  Bladder mucosa without solid or papillary lesions; UOs normal-appearing bilaterally, moderate trabeculation. Ureteropyeloscopy: 2 calculi distal ureter ~3 mm in size; ~5 mm lower proximal ureteral calculus; ~10 mm calculus midpole infundibulum; ~3 mm lower calyceal calculus Right retrograde pyelography post procedure showed no filling defects, stone fragments or contrast extravasation  EBL: Minimal  Specimens: None   Indication: Joe Dunlap is a 63 y.o.  male with right renal and ureteral calculi.  Refer to the admission H&P for details.  After reviewing the management options for treatment, the patient elected to proceed with the above surgical procedure(s). We have discussed the potential benefits and risks of the procedure, side effects of the proposed treatment, the likelihood of the patient achieving the goals of the procedure, and any potential problems that might occur during the procedure or recuperation. Informed consent has been obtained.  Description of procedure:  The patient was taken to the operating room and general anesthesia was induced.  The patient was placed in the dorsal lithotomy position, prepped and draped in the usual sterile fashion, and preoperative antibiotics were administered. A preoperative time-out was performed.   The urethral meatus would not except a 21 French cystoscope and the distal urethra was  dilated with [employer] sounds from 18-24 F.  The cystoscope was then easily placed and advanced  into the bladder under direct vision with findings as described above.    Attention was directed to the right ureteral orifice and a 0.038 Sensor wire was then advanced up the ureter into the renal pelvis under fluoroscopic guidance.  A 4.5 Fr semirigid ureteroscope was then advanced into the ureter next to the guidewire with findings as described above.  The 2 distal ureteral calculi were removed with a 1.64F [place] basket.  A second guidewire was placed and the semirigid ureteroscope was removed.    A single channel digital flexible ureteroscope was then advanced over the working wire into the distal ureter.  The guidewire was removed and the flexible ureteroscope was advanced proximally.  A third ureteral stone was identified as described above.  This was unable to be removed with the basket. A 200 m Moses holmium laser fiber was placed through the ureteroscope and the stone was dusted at a setting of 0.3 J/80 hz.  The remainder of the ureter was normal in appearance.  Pyeloscopy was then performed with findings as described above.  The 2 renal calculi were then dusted with the holmium laser fiber at a setting of 0.3J/80 Hz.  Additional noncontact laser lithotripsy was performed on the larger stone fragments at a setting of 0.3J/120 Hz.  Retrograde pyelogram was performed with findings as described above.  All calyces were examined under fluoroscopic guidance and no significantly sized stone fragments were identified.  A 6 F/26 cm Contour ureteral stent without tether was placed under fluoroscopic guidance.  The wire was then removed with an adequate stent curl noted in the renal pelvis as well as in the bladder.  The bladder was then emptied and the procedure ended.  The patient appeared to tolerate  the procedure well and without complications.  After anesthetic reversal the patient was transported to  the PACU in stable condition.   Plan: Schedule office cystoscopy with stent removal in ~1 week   Glendia Barba, MD

## 2023-08-24 NOTE — Anesthesia Preprocedure Evaluation (Addendum)
 Anesthesia Evaluation  Patient identified by MRN, date of birth, ID band Patient awake    Reviewed: Allergy & Precautions, NPO status , Patient's Chart, lab work & pertinent test results  History of Anesthesia Complications Negative for: history of anesthetic complications  Airway Mallampati: III   Neck ROM: Full    Dental no notable dental hx.    Pulmonary sleep apnea and Continuous Positive Airway Pressure Ventilation    Pulmonary exam normal breath sounds clear to auscultation       Cardiovascular hypertension, + CAD and + Peripheral Vascular Disease (BILATERAL carotid artery disease)  Normal cardiovascular exam(-) dysrhythmias  Rhythm:Regular Rate:Normal  ECG 03/29/23: NSR, HR 72 bpm, no acute ST-T wave changes; no significant changes since last EKG  Myocardial perfusion 07/30/22: Normal myocardial perfusion scan no evidence of stress-induced myocardial ischemia ejection fraction of 63% conclusion negative scan this is a lower study   ECHO 07/23/22:  1. Normal left ventricular systolic function with an EF of >55% 2. Normal left ventricular diastolic Doppler parameters 3. Normal right ventricular systolic function 4. Trivial MR, TR, PR 5. Normal transvalvular gradients; no valvular stenosis 6. No pericardial effusion    Neuro/Psych  Headaches    GI/Hepatic PUD,GERD  ,,  Endo/Other  Obesity   Renal/GU Renal disease (CKD; nephrolithiasis)   BPH s/p TURP    Musculoskeletal   Abdominal Normal abdominal exam  (+)   Peds  Hematology negative hematology ROS (+)   Anesthesia Other Findings Reviewed and agree with Dorise Boor pre-anesthesia clinical review note.    Cardiology note 03/29/23:  Patient with chronic chest pain since 06/24/22 visit, that worsens when at work a/w stress. Pain is persistent but not worsening. Pt with h/o non-obstructive CAD by LHC in 2020. No evidence of stress induced myocardial ischemia on  ETT Myoview 07/30/22. ECHO 07/23/22 with preserved function and no significant valvular disease. Continue medical management with isosorbide, metoprolol, pravastatin. No aspirin  with prior GI ulcer. CCTA if s/s worsening.  BP at goal. Continue current management.  Continue pravastatin - last LDL close to goal of < 70 mg/dL. Will continue to monitor.  Continue CPAP.    Reproductive/Obstetrics                             Anesthesia Physical Anesthesia Plan  ASA: 3  Anesthesia Plan: General   Post-op Pain Management: Ofirmev  IV (intra-op)* and Precedex    Induction: Intravenous  PONV Risk Score and Plan: 2 and Ondansetron , Dexamethasone  and Midazolam   Airway Management Planned: Oral ETT  Additional Equipment:   Intra-op Plan:   Post-operative Plan: Extubation in OR  Informed Consent: I have reviewed the patients History and Physical, chart, labs and discussed the procedure including the risks, benefits and alternatives for the proposed anesthesia with the patient or authorized representative who has indicated his/her understanding and acceptance.     Dental advisory given  Plan Discussed with: CRNA  Anesthesia Plan Comments: (Patient consented for risks of anesthesia including but not limited to:  - adverse reactions to medications - damage to eyes, teeth, lips or other oral mucosa - nerve damage due to positioning  - sore throat or hoarseness - damage to heart, brain, nerves, lungs, other parts of body or loss of life  Informed patient about role of CRNA in peri- and intra-operative care.  Patient voiced understanding.)        Anesthesia Quick Evaluation

## 2023-08-24 NOTE — Discharge Instructions (Signed)
 DISCHARGE INSTRUCTIONS FOR KIDNEY STONE/URETERAL STENT   MEDICATIONS:  1. Resume all your other meds from home.  2.  AZO (over-the-counter) can help with the burning/stinging when you urinate. 3.  Hydrocodone  is for moderate/severe pain, Rx was sent to your pharmacy. 4.  Tamsulosin  and oxybutynin  or for stent/bladder irritation, Rxs were sent to your pharmacy  ACTIVITY:  1. May resume regular activities in 24 hours. 2. No driving while on narcotic pain medications  3. Drink plenty of water  4. Continue to walk at home - you can still get blood clots when you are at home, so keep active, but don't over do it.  5. May return to work/school tomorrow or when you feel ready    SIGNS/SYMPTOMS TO CALL:  Common postoperative symptoms include urinary frequency, urgency, bladder spasm and blood in the urine  Please call us  if you have a fever greater than 101.5, uncontrolled nausea/vomiting, uncontrolled pain, dizziness, unable to urinate, excessively bloody urine, chest pain, shortness of breath, leg swelling, leg pain, or any other concerns or questions.   You can reach us  at [phone].   FOLLOW-UP:  1.  Will contact you to schedule an appointment for stent removal in approximately 1 week

## 2023-08-24 NOTE — Anesthesia Procedure Notes (Signed)
 Procedure Name: Intubation Date/Time: 08/24/2023 7:34 AM  Performed by: Myra Lawless, CRNAPre-anesthesia Checklist: Patient identified, Patient being monitored, Timeout performed, Emergency Drugs available and Suction available Patient Re-evaluated:Patient Re-evaluated prior to induction Oxygen Delivery Method: Circle system utilized Preoxygenation: Pre-oxygenation with 100% oxygen Induction Type: IV induction Ventilation: Mask ventilation without difficulty Laryngoscope Size: McGrath and 4 Grade View: Grade I Tube type: Oral Tube size: 7.5 mm Number of attempts: 1 Airway Equipment and Method: Stylet Placement Confirmation: ETT inserted through vocal cords under direct vision, positive ETCO2 and breath sounds checked- equal and bilateral Secured at: 21 cm Tube secured with: Tape Dental Injury: Teeth and Oropharynx as per pre-operative assessment  Comments: DL x1 with McGrath MAC 4 blade, grade 1 view. Atraumatic intubation. Dentition unchanged from preop baseline.

## 2023-08-25 ENCOUNTER — Encounter: Payer: Self-pay | Admitting: Urology

## 2023-09-01 ENCOUNTER — Ambulatory Visit (INDEPENDENT_AMBULATORY_CARE_PROVIDER_SITE_OTHER): Admitting: Urology

## 2023-09-01 VITALS — BP 119/68 | HR 84 | Ht 70.0 in | Wt 252.0 lb

## 2023-09-01 DIAGNOSIS — Z466 Encounter for fitting and adjustment of urinary device: Secondary | ICD-10-CM | POA: Diagnosis not present

## 2023-09-01 LAB — URINALYSIS, COMPLETE
Bilirubin, UA: NEGATIVE
Glucose, UA: NEGATIVE
Ketones, UA: NEGATIVE
Nitrite, UA: NEGATIVE
Specific Gravity, UA: 1.015 (ref 1.005–1.030)
Urobilinogen, Ur: 0.2 mg/dL (ref 0.2–1.0)
pH, UA: 7.5 (ref 5.0–7.5)

## 2023-09-01 LAB — MICROSCOPIC EXAMINATION
RBC, Urine: >30 /HPF — AB (ref 0–2)
WBC, UA: >30 /HPF — AB (ref 0–5)

## 2023-09-01 MED ORDER — SULFAMETHOXAZOLE-TRIMETHOPRIM 800-160 MG PO TABS
1.0000 | ORAL_TABLET | Freq: Once | ORAL | Status: AC
  Administered 2023-09-01: 1 via ORAL

## 2023-09-01 MED ORDER — SULFAMETHOXAZOLE-TRIMETHOPRIM 800-160 MG PO TABS: 1.0000 | ORAL_TABLET | Freq: Two times a day (BID) | ORAL | 0 refills | Status: AC

## 2023-09-02 ENCOUNTER — Encounter: Payer: Self-pay | Admitting: Urology

## 2023-09-02 NOTE — Progress Notes (Signed)
   Indications: Patient is 63 y.o., who is s/p ureteroscopic removal of 2 distal ureteral calculi, 5 mm proximal ureteral calculus and renal calculi x 2 on the right side 08/20/2023.  He had no postoperative complaints.  The patient is presenting today for stent removal.  UA >30 WBC/RBC.  Nitrite negative  Procedure:  Flexible Cystoscopy with stent removal (47689)  Timeout was performed and the correct patient, procedure and participants were identified.    Description:  The patient was prepped and draped in the usual sterile fashion. Flexible cystosopy was performed.  The stent was visualized, grasped, and removed intact without difficulty. The patient tolerated the procedure well.  A single dose of oral antibiotics was given.  Complications:  None  Plan:  Call for fever/flank pain post stent removal Urine culture ordered Rx Septra  DS 1 twice daily sent to pharmacy pending culture report 64-month follow-up with KUB We discussed a metabolic evaluation and he wanted to think over.  Glendia Barba, MD

## 2023-09-06 ENCOUNTER — Emergency Department
Admission: EM | Admit: 2023-09-06 | Discharge: 2023-09-06 | Discharge status: Against Medical Advice | Source: Home / Self Care

## 2023-11-08 ENCOUNTER — Encounter: Payer: Self-pay | Admitting: *Deleted

## 2023-11-26 ENCOUNTER — Ambulatory Visit
Admission: RE | Admit: 2023-11-26 | Discharge: 2023-11-26 | Disposition: A | Discharge status: Home / Self Care | Attending: Gastroenterology | Admitting: Gastroenterology

## 2023-11-26 ENCOUNTER — Ambulatory Visit: Payer: Self-pay

## 2023-11-26 ENCOUNTER — Encounter: Payer: Self-pay | Admitting: Gastroenterology

## 2023-11-26 ENCOUNTER — Encounter
Admission: RE | Disposition: A | Discharge status: Home / Self Care | Payer: Self-pay | Source: Home / Self Care | Attending: Gastroenterology

## 2023-11-26 DIAGNOSIS — I10 Essential (primary) hypertension: Secondary | ICD-10-CM | POA: Diagnosis not present

## 2023-11-26 DIAGNOSIS — I2510 Atherosclerotic heart disease of native coronary artery without angina pectoris: Secondary | ICD-10-CM | POA: Diagnosis not present

## 2023-11-26 DIAGNOSIS — K219 Gastro-esophageal reflux disease without esophagitis: Secondary | ICD-10-CM | POA: Diagnosis not present

## 2023-11-26 DIAGNOSIS — Z8711 Personal history of peptic ulcer disease: Secondary | ICD-10-CM | POA: Diagnosis not present

## 2023-11-26 DIAGNOSIS — G4733 Obstructive sleep apnea (adult) (pediatric): Secondary | ICD-10-CM | POA: Diagnosis not present

## 2023-11-26 DIAGNOSIS — Z6835 Body mass index (BMI) 35.0-35.9, adult: Secondary | ICD-10-CM | POA: Insufficient documentation

## 2023-11-26 DIAGNOSIS — E66813 Obesity, class 3: Secondary | ICD-10-CM | POA: Insufficient documentation

## 2023-11-26 DIAGNOSIS — Z79899 Other long term (current) drug therapy: Secondary | ICD-10-CM | POA: Insufficient documentation

## 2023-11-26 DIAGNOSIS — R1310 Dysphagia, unspecified: Secondary | ICD-10-CM | POA: Diagnosis present

## 2023-11-26 HISTORY — DX: Chronic tension-type headache, not intractable: G44.229

## 2023-11-26 HISTORY — PX: ESOPHAGOGASTRODUODENOSCOPY: SHX5428

## 2023-11-26 SURGERY — EGD (ESOPHAGOGASTRODUODENOSCOPY)
Anesthesia: General

## 2023-11-26 MED ORDER — LIDOCAINE HCL (PF) 2 % IJ SOLN
INTRAMUSCULAR | Status: AC
  Filled 2023-11-26: qty 5

## 2023-11-26 MED ORDER — GLYCOPYRROLATE 0.2 MG/ML IJ SOLN
INTRAMUSCULAR | Status: DC | PRN
  Administered 2023-11-26: .2 mg via INTRAVENOUS

## 2023-11-26 MED ORDER — LIDOCAINE HCL (CARDIAC) PF 100 MG/5ML IV SOSY
PREFILLED_SYRINGE | INTRAVENOUS | Status: DC | PRN
  Administered 2023-11-26: 80 mg via INTRAVENOUS

## 2023-11-26 MED ORDER — GLYCOPYRROLATE 0.2 MG/ML IJ SOLN
INTRAMUSCULAR | Status: AC
  Filled 2023-11-26: qty 1

## 2023-11-26 MED ORDER — SODIUM CHLORIDE 0.9 % IV SOLN
INTRAVENOUS | Status: DC
  Administered 2023-11-26: 20 mL/h via INTRAVENOUS

## 2023-11-26 MED ORDER — PROPOFOL 500 MG/50ML IV EMUL
INTRAVENOUS | Status: DC | PRN
  Administered 2023-11-26: 75 ug/kg/min via INTRAVENOUS

## 2023-11-26 MED ORDER — PROPOFOL 10 MG/ML IV BOLUS
INTRAVENOUS | Status: DC | PRN
  Administered 2023-11-26 (×2): 50 mg via INTRAVENOUS

## 2023-11-26 MED ORDER — DEXMEDETOMIDINE HCL IN NACL 80 MCG/20ML IV SOLN
INTRAVENOUS | Status: DC | PRN
  Administered 2023-11-26: 8 ug via INTRAVENOUS
  Administered 2023-11-26: 12 ug via INTRAVENOUS

## 2023-11-26 MED ORDER — PROPOFOL 1000 MG/100ML IV EMUL
INTRAVENOUS | Status: AC
  Filled 2023-11-26: qty 100

## 2023-11-26 NOTE — H&P (Signed)
 Outpatient short stay form Pre-procedure 11/26/2023  Joe ONEIDA Schick, MD  Primary Physician: Jeffie Cheryl BRAVO, MD  Reason for visit:  Dysphagia  History of present illness:    63 y/o gentleman with history of hypertension, OSA, and obesity here for EGD for solid food dysphagia. No blood thinners. No neck surgeries. No family history of GI malignancies.    Current Facility-Administered Medications:    0.9 %  sodium chloride  infusion, , Intravenous, Continuous, Langston [place], Joe ONEIDA, MD, Last Rate: 20 mL/hr at 11/26/23 1112, 20 mL/hr at 11/26/23 1112  Medications Prior to Admission  Medication Sig Dispense Refill Last Dose/Taking   amLODipine (NORVASC) 10 MG tablet Take 10 mg by mouth at bedtime.   11/25/2023   Ascorbic Acid (VITAMIN C PO) Take 500 mg by mouth daily.   11/25/2023   CINNAMON PO Take 1 capsule by mouth daily.   11/25/2023   Coenzyme Q10 (COQ10 PO) Take 200 mg by mouth daily.   11/25/2023   CRANBERRY PO Take 1 tablet by mouth daily.   11/25/2023   HYDROcodone -acetaminophen  (NORCO/VICODIN) 5-325 MG tablet Take 1 tablet by mouth every 6 (six) hours as needed for moderate pain (pain score 4-6). 10 tablet 0 11/25/2023   isosorbide mononitrate (IMDUR) 30 MG 24 hr tablet Take 30 mg by mouth daily with lunch.   11/25/2023   Menthol-Methyl Salicylate (SALONPAS PAIN RELIEF PATCH EX) Place 1 patch onto the skin daily as needed (pain.).   11/25/2023   Multiple Vitamin (MULTIVITAMIN WITH MINERALS) TABS tablet Take 1 tablet by mouth daily.   11/25/2023   Multiple Vitamins-Minerals (HEALTHY EYES/LUTEIN-ZEAXANTHIN) CAPS Take 1 capsule by mouth daily.   11/25/2023   omeprazole (PRILOSEC OTC) 20 MG tablet Take 20 mg by mouth daily.   11/25/2023   oxybutynin  (DITROPAN ) 5 MG tablet 1 tab tid prn frequency,urgency, bladder spasm 30 tablet 0 11/25/2023   pravastatin (PRAVACHOL) 40 MG tablet Take 40 mg by mouth at bedtime.   11/25/2023   tamsulosin  (FLOMAX ) 0.4 MG CAPS capsule Take 1 capsule (0.4 mg  total) by mouth daily after breakfast. 14 capsule 0 11/25/2023   vitamin B-12 (CYANOCOBALAMIN) 500 MCG tablet Take 500 mcg by mouth daily.   11/25/2023   metoprolol succinate (TOPROL-XL) 25 MG 24 hr tablet Take 12.5 mg by mouth daily with lunch.        Allergies  Allergen Reactions   Nsaids Other (See Comments)    Hx of ulcer     Past Medical History:  Diagnosis Date   Anginal pain    Aortic atherosclerosis    Benign prostatic hyperplasia with urinary obstruction    a.) s/p TURP 06/2023   Bilateral carotid artery disease    Chronic tension type headache    Coronary artery disease    DDD (degenerative disc disease), lumbar    Degeneration of spine    Diverticulosis    GERD (gastroesophageal reflux disease)    Headache    Hepatic steatosis    Hyperlipemia    Hypertension    Impaired glucose tolerance    Kidney stones 2018   Labral tear of hip, degenerative    Lumbar radiculitis    Obesity (BMI 30-39.9)    OSA on CPAP    Periumbilical hernia    Renal cyst, left    Shingles    Shingles    LEFT   Stomach ulcer    Supraumbilical hernia    Ventral hernia     Review of systems:  Otherwise negative.  Physical Exam  Gen: Alert, oriented. Appears stated age.  HEENT: PERRLA. Lungs: No respiratory distress CV: RRR Abd: soft, benign, no masses Ext: No edema    Planned procedures: Proceed with EGD. The patient understands the nature of the planned procedure, indications, risks, alternatives and potential complications including but not limited to bleeding, infection, perforation, damage to internal organs and possible oversedation/side effects from anesthesia. The patient agrees and gives consent to proceed.  Please refer to procedure notes for findings, recommendations and patient disposition/instructions.     Joe ONEIDA Schick, MD [facility]

## 2023-11-26 NOTE — Op Note (Signed)
 [facility] Patient Name: Joe Dunlap Procedure Date: 11/26/2023 11:30 AM MRN: [id number] Account #: [id number] Date of Birth: [birth year] Admit Type: Outpatient Age: 63 Room: [facility] ENDO ROOM 3 Gender: Male Note Status: Finalized Instrument Name: Upper GI Scope [id number] Procedure:             Upper GI endoscopy Indications:           Dysphagia Providers:             Ole Schick MD, MD Referring MD:          Madeleine Craze Medicines:             Monitored Anesthesia Care Complications:         No immediate complications. Estimated blood loss:                         Minimal. Procedure:             Pre-Anesthesia Assessment:                        - Prior to the procedure, a History and Physical was                         performed, and patient medications and allergies were                         reviewed. The patient is competent. The risks and                         benefits of the procedure and the sedation options and                         risks were discussed with the patient. All questions                         were answered and informed consent was obtained.                         Patient identification and proposed procedure were                         verified by the physician, the nurse, the                         anesthesiologist, the anesthetist and the technician                         in the endoscopy suite. Mental Status Examination:                         alert and oriented. Airway Examination: normal                         oropharyngeal airway and neck mobility. Respiratory                         Examination: clear to auscultation. CV Examination:  normal. Prophylactic Antibiotics: The patient does not                         require prophylactic antibiotics. Prior                         Anticoagulants: The patient has taken no anticoagulant                         or antiplatelet agents.  ASA Grade Assessment: III - A                         patient with severe systemic disease. After reviewing                         the risks and benefits, the patient was deemed in                         satisfactory condition to undergo the procedure. The                         anesthesia plan was to use monitored anesthesia care                         (MAC). Immediately prior to administration of                         medications, the patient was re-assessed for adequacy                         to receive sedatives. The heart rate, respiratory                         rate, oxygen saturations, blood pressure, adequacy of                         pulmonary ventilation, and response to care were                         monitored throughout the procedure. The physical                         status of the patient was re-assessed after the                         procedure.                        After obtaining informed consent, the endoscope was                         passed under direct vision. Throughout the procedure,                         the patient's blood pressure, pulse, and oxygen                         saturations were monitored continuously. The Endoscope  was introduced through the mouth, and advanced to the                         second part of duodenum. The upper GI endoscopy was                         accomplished without difficulty. The patient tolerated                         the procedure well. Findings:      The distal esophagus was mildly tortuous.      No endoscopic abnormality was evident in the esophagus to explain the       patient's complaint of dysphagia. It was decided, however, to proceed       with dilation of the lower third of the esophagus. A TTS dilator was       passed through the scope. Dilation with a 15-16.5-18 mm balloon dilator       was performed to 16.5 mm. The dilation site was examined and showed       moderate  mucosal disruption. Estimated blood loss was minimal.      Biopsies were obtained from the proximal and distal esophagus with cold       forceps for histology of suspected eosinophilic esophagitis.      The entire examined stomach was normal.      The examined duodenum was normal. Impression:            - Tortuous esophagus.                        - No endoscopic esophageal abnormality to explain                         patient's dysphagia. Esophagus dilated. Dilated.                        - Normal stomach.                        - Normal examined duodenum.                        - Biopsies were taken with a cold forceps for                         evaluation of eosinophilic esophagitis. Recommendation:        - Discharge patient to home.                        - Resume previous diet.                        - Continue present medications.                        - Await pathology results.                        - Return to referring physician as previously                         scheduled. If improvement  in swallowing, would                         consider repeat EGD in order to dilate to 18 mm. Procedure Code(s):     --- Professional ---                        [phone], Esophagogastroduodenoscopy, flexible,                         transoral; with transendoscopic balloon dilation of                         esophagus (less than 30 mm diameter) Diagnosis Code(s):     --- Professional ---                        Q39.9, Congenital malformation of esophagus,                         unspecified                        R13.10, Dysphagia, unspecified CPT copyright 2022 American Medical Association. All rights reserved. The codes documented in this report are preliminary and upon coder review may  be revised to meet current compliance requirements. Ole Schick MD, MD 11/26/2023 12:00:44 PM Number of Addenda: 0 Note Initiated On: 11/26/2023 11:30 AM Estimated Blood Loss:  Estimated blood loss  was minimal.      [facility]

## 2023-11-26 NOTE — Interval H&P Note (Signed)
 History and Physical Interval Note:  11/26/2023 11:40 AM  Joe Dunlap  has presented today for surgery, with the diagnosis of Pharyngoesophageal dysphagia.  The various methods of treatment have been discussed with the patient and family. After consideration of risks, benefits and other options for treatment, the patient has consented to  Procedure(s): EGD (ESOPHAGOGASTRODUODENOSCOPY) (N/A) as a surgical intervention.  The patient's history has been reviewed, patient examined, no change in status, stable for surgery.  I have reviewed the patient's chart and labs.  Questions were answered to the patient's satisfaction.     Ole ONEIDA Schick  Ok to proceed with EGD

## 2023-11-26 NOTE — Anesthesia Preprocedure Evaluation (Signed)
 Anesthesia Evaluation  Patient identified by MRN, date of birth, ID band Patient awake    Reviewed: Allergy & Precautions, NPO status , Patient's Chart, lab work & pertinent test results  Airway Mallampati: III  TM Distance: >3 FB Neck ROM: full    Dental  (+) Teeth Intact   Pulmonary neg pulmonary ROS, sleep apnea and Continuous Positive Airway Pressure Ventilation    Pulmonary exam normal breath sounds clear to auscultation       Cardiovascular Exercise Tolerance: Good hypertension, Pt. on medications + CAD  negative cardio ROS Normal cardiovascular exam Rhythm:Regular Rate:Normal     Neuro/Psych  Headaches negative neurological ROS  negative psych ROS   GI/Hepatic negative GI ROS, Neg liver ROS, PUD,GERD  Medicated,,  Endo/Other  negative endocrine ROS  Class 3 obesity  Renal/GU negative Renal ROS  negative genitourinary   Musculoskeletal   Abdominal  (+) + obese  Peds negative pediatric ROS (+)  Hematology negative hematology ROS (+)   Anesthesia Other Findings Past Medical History: No date: Anginal pain No date: Aortic atherosclerosis No date: Benign prostatic hyperplasia with urinary obstruction     Comment:  a.) s/p TURP 06/2023 No date: Bilateral carotid artery disease No date: Chronic tension type headache No date: Coronary artery disease No date: DDD (degenerative disc disease), lumbar No date: Degeneration of spine No date: Diverticulosis No date: GERD (gastroesophageal reflux disease) No date: Headache No date: Hepatic steatosis No date: Hyperlipemia No date: Hypertension No date: Impaired glucose tolerance 2018: Kidney stones No date: Labral tear of hip, degenerative No date: Lumbar radiculitis No date: Obesity (BMI 30-39.9) No date: OSA on CPAP No date: Periumbilical hernia No date: Renal cyst, left No date: Shingles No date: Shingles     Comment:  LEFT No date: Stomach ulcer No  date: Supraumbilical hernia No date: Ventral hernia  Past Surgical History: 02/11/2018: COLONOSCOPY WITH PROPOFOL ; N/A     Comment:  Procedure: COLONOSCOPY WITH PROPOFOL ;  Surgeon:               Gaylyn Gladis PENNER, MD;  Location: [facility];                Service: Endoscopy;  Laterality: N/A; 08/14/2019: CYSTOSCOPY WITH INSERTION OF UROLIFT; N/A     Comment:  Procedure: CYSTOSCOPY WITH INSERTION OF UROLIFT;                Surgeon: Penne Knee, MD;  Location: [facility];                Service: Urology;  Laterality: N/A; 09/08/2021: CYSTOSCOPY WITH INSERTION OF UROLIFT; N/A     Comment:  Procedure: CYSTOSCOPY WITH INSERTION OF UROLIFT;                Surgeon: Penne Knee, MD;  Location: [facility];                Service: Urology;  Laterality: N/A; 08/24/2023: CYSTOSCOPY/URETEROSCOPY/HOLMIUM LASER/STENT PLACEMENT;  Right     Comment:  Procedure: CYSTOSCOPY/URETEROSCOPY/HOLMIUM LASER/STENT               PLACEMENT;  Surgeon: Twylla Glendia BROCKS, MD;  Location:               [facility];  Service: Urology;  Laterality: Right; 06/25/2016: EXTRACORPOREAL SHOCK WAVE LITHOTRIPSY; Right     Comment:  Procedure: EXTRACORPOREAL SHOCK WAVE LITHOTRIPSY (ESWL);              Surgeon: Knee Penne, MD;  Location: [facility];                Service: Urology;  Laterality: Right; 08/13/2022: EXTRACORPOREAL SHOCK WAVE LITHOTRIPSY; Right     Comment:  Procedure: EXTRACORPOREAL SHOCK WAVE LITHOTRIPSY (ESWL);              Surgeon: Francisca Redell BROCKS, MD;  Location: [facility];                Service: Urology;  Laterality: Right; No date: GASTRIC ULCER REPAIR No date: KNEE SURGERY; Left 05/11/2018: LEFT HEART CATH AND CORONARY ANGIOGRAPHY; Left     Comment:  Procedure: LEFT HEART CATH AND CORONARY ANGIOGRAPHY;                Surgeon: Ammon Blunt, MD;  Location: [facility]               INVASIVE CV LAB;  Service: Cardiovascular;  Laterality:               Left; No date: NASAL SEPTOPLASTY W/  TURBINOPLASTY; N/A No date: TONSILLECTOMY 06/14/2023: TRANSURETHRAL RESECTION OF PROSTATE; N/A     Comment:  Procedure: TURP (TRANSURETHRAL RESECTION OF PROSTATE);                Surgeon: Penne Knee, MD;  Location: [facility];                Service: Urology;  Laterality: N/A; 2008: UPPER GI ENDOSCOPY     Comment:  stomach ulcer 2000: UVULOPALATOPHARYNGOPLASTY (UPPP)/TONSILLECTOMY/SEPTOPLASTY  BMI    Body Mass Index: 35.64 kg/m      Reproductive/Obstetrics negative OB ROS                              Anesthesia Physical Anesthesia Plan  ASA: 3  Anesthesia Plan: General   Post-op Pain Management:    Induction:   PONV Risk Score and Plan: Propofol  infusion and TIVA  Airway Management Planned: Nasal Cannula  Additional Equipment:   Intra-op Plan:   Post-operative Plan:   Informed Consent: I have reviewed the patients History and Physical, chart, labs and discussed the procedure including the risks, benefits and alternatives for the proposed anesthesia with the patient or authorized representative who has indicated his/her understanding and acceptance.     Dental Advisory Given  Plan Discussed with: CRNA  Anesthesia Plan Comments:         Anesthesia Quick Evaluation

## 2023-11-26 NOTE — Anesthesia Postprocedure Evaluation (Signed)
 Anesthesia Post Note  Patient: Joe Dunlap  Procedure(s) Performed: EGD (ESOPHAGOGASTRODUODENOSCOPY) Balloon dilation wire-guided  Patient location during evaluation: PACU Anesthesia Type: General Level of consciousness: awake and awake and alert Pain management: satisfactory to patient Vital Signs Assessment: post-procedure vital signs reviewed and stable Respiratory status: spontaneous breathing Cardiovascular status: stable Anesthetic complications: no   No notable events documented.   Last Vitals:  Vitals:   11/26/23 1210 11/26/23 1214  BP:  (!) 92/50  Pulse: (!) 59 (!) 57  Resp: 18 18  Temp:    SpO2: 93% 96%    Last Pain:  Vitals:   11/26/23 1159  TempSrc: Tympanic  PainSc: Asleep                 VAN STAVEREN,Kortni Hasten

## 2023-11-26 NOTE — Transfer of Care (Signed)
 Immediate Anesthesia Transfer of Care Note  Patient: Joe Dunlap  Procedure(s) Performed: EGD (ESOPHAGOGASTRODUODENOSCOPY) Balloon dilation wire-guided  Patient Location: PACU  Anesthesia Type:General  Level of Consciousness: sedated  Airway & Oxygen Therapy: Patient Spontanous Breathing  Post-op Assessment: Report given to RN and Post -op Vital signs reviewed and stable  Post vital signs: Reviewed and stable  Last Vitals:  Vitals Value Taken Time  BP    Temp 35.6 C 11/26/23 11:59  Pulse 61 11/26/23 11:59  Resp 13 11/26/23 11:59  SpO2 94 % 11/26/23 11:59    Last Pain:  Vitals:   11/26/23 1159  TempSrc: Tympanic  PainSc: Asleep         Complications: No notable events documented.

## 2023-11-29 LAB — SURGICAL PATHOLOGY

## 2023-12-22 ENCOUNTER — Other Ambulatory Visit: Payer: Self-pay | Admitting: Urology

## 2024-01-01 ENCOUNTER — Ambulatory Visit
Admission: EM | Admit: 2024-01-01 | Discharge: 2024-01-01 | Disposition: A | Discharge status: Home / Self Care | Attending: Family Medicine | Admitting: Family Medicine

## 2024-01-01 ENCOUNTER — Emergency Department

## 2024-01-01 ENCOUNTER — Emergency Department
Admission: EM | Admit: 2024-01-01 | Discharge: 2024-01-01 | Disposition: A | Discharge status: Home / Self Care | Attending: Emergency Medicine | Admitting: Emergency Medicine

## 2024-01-01 ENCOUNTER — Other Ambulatory Visit: Payer: Self-pay

## 2024-01-01 DIAGNOSIS — R079 Chest pain, unspecified: Secondary | ICD-10-CM | POA: Insufficient documentation

## 2024-01-01 DIAGNOSIS — I2510 Atherosclerotic heart disease of native coronary artery without angina pectoris: Secondary | ICD-10-CM | POA: Insufficient documentation

## 2024-01-01 LAB — BASIC METABOLIC PANEL WITH GFR
Anion gap: 9 (ref 5–15)
BUN: 17 mg/dL (ref 8–23)
CO2: 22 mmol/L (ref 22–32)
Calcium: 9 mg/dL (ref 8.9–10.3)
Chloride: 107 mmol/L (ref 98–111)
Creatinine, Ser: 0.84 mg/dL (ref 0.61–1.24)
GFR, Estimated: >60 mL/min (ref >60)
Glucose, Bld: 125 mg/dL — ABNORMAL HIGH (ref 70–99)
Potassium: 3.7 mmol/L (ref 3.5–5.1)
Sodium: 138 mmol/L (ref 135–145)

## 2024-01-01 LAB — TROPONIN I (HIGH SENSITIVITY)
Troponin I (High Sensitivity): 3 ng/L
Troponin I (High Sensitivity): 4 ng/L (ref <18)

## 2024-01-01 LAB — CBC
HCT: 42.6 % (ref 39.0–52.0)
Hemoglobin: 14.6 g/dL (ref 13.0–17.0)
MCH: 30.7 pg (ref 26.0–34.0)
MCHC: 34.3 g/dL (ref 30.0–36.0)
MCV: 89.7 fL (ref 80.0–100.0)
Platelets: 225 K/uL (ref 150–400)
RBC: 4.75 MIL/uL (ref 4.22–5.81)
RDW: 13 % (ref 11.5–15.5)
WBC: 7.4 K/uL (ref 4.0–10.5)
nRBC: 0 % (ref 0.0–0.2)

## 2024-01-01 LAB — D-DIMER, QUANTITATIVE: D-Dimer, Quant: 0.45 ug{FEU}/mL (ref 0.00–0.50)

## 2024-01-01 NOTE — ED Provider Notes (Signed)
 [facility] Provider Note    Event Date/Time   First MD Initiated Contact with Patient 01/01/24 1625     (approximate)   History   Chest Pain   HPI  Joe Dunlap is a 63 y.o. male history of previous stomach ulcer, surgical abdominal repair, carotid artery disease, documentation of coronary artery disease [05/11/2018 showed: Prox Cx lesion is 60% stenosed, Ost Cx to Prox Cx lesion is 30% stenosed, Ost 1st Diag lesion is 30% stenosed, Acute Mrg lesion is 75% stenosed, Dist RCA lesion is 30% stenosed. ]  Per Dr. Yancy, reflux   For the last 4 days has had a very slight but steady minimal pressure under his breastbone.  No abdominal pain no nausea or vomiting.  Does not radiate.  He has noticed it while he is been at work he walks 10-20,000 steps a day and he reports it does not change the symptom at all.  Just a very mild pressure under his breastbone.  No other symptoms no back pain does not move it does not radiate  He went to urgent care, they told him things look pretty reassuring but was encouraged to come to the ER for more extensive testing  No leg swelling no shortness of breath no history of blood clots.  No travel history recent.  Works frequently walks anywhere from 15-20,000 steps a day there  Physical Exam   Triage Vital Signs: ED Triage Vitals  Encounter Vitals Group     BP 01/01/24 1541 (!) 144/69     Girls Systolic BP Percentile --      Girls Diastolic BP Percentile --      Boys Systolic BP Percentile --      Boys Diastolic BP Percentile --      Pulse Rate 01/01/24 1541 84     Resp 01/01/24 1541 16     Temp 01/01/24 1539 98.1 F (36.7 C)     Temp Source 01/01/24 1539 Oral     SpO2 01/01/24 1541 98 %     Weight --      Height --      Head Circumference --      Peak Flow --      Pain Score 01/01/24 1539 1     Pain Loc --      Pain Education --      Exclude from Growth Chart --     Most recent vital signs: Vitals:    01/01/24 1900 01/01/24 1930  BP: 127/67 (!) 102/44  Pulse: 68 64  Resp: 15 14  Temp:  98.7 F (37.1 C)  SpO2: 100% 98%     General: Awake, no distress.  CV:  Good peripheral perfusion.  Normal tones and rate Resp:  Normal effort.  Clear bilateral Abd:  No distention.  Soft nontender nondistended.  Large midline scar well-healed Other:  No lower extreme edema venous cords or congestion   ED Results / Procedures / Treatments   Labs (all labs ordered are listed, but only abnormal results are displayed) Labs Reviewed  BASIC METABOLIC PANEL WITH GFR - Abnormal; Notable for the following components:      Result Value   Glucose, Bld 125 (*)    All other components within normal limits  CBC  D-DIMER, QUANTITATIVE  TROPONIN I (HIGH SENSITIVITY)  TROPONIN I (HIGH SENSITIVITY)     EKG  And interpreted by me at 1540 heart rate 85 QRS 80 QTc 450 Normal sinus rhythm no  evidence of acute ischemia.  Slight artifact.   I also reviewed the urgent care note, provider at urgent care also found no ischemic findings on ECG there  RADIOLOGY Chest x-ray inter by me is normal  DG Chest 2 View Result Date: 01/01/2024 CLINICAL DATA:  Chest pain. EXAM: CHEST - 2 VIEW COMPARISON:  08/02/2020. FINDINGS: The heart size and mediastinal contours are within normal limits. There is atherosclerotic calcification of the aorta. No consolidation, effusion, or pneumothorax is seen. Minimal subsegmental atelectasis is noted at the left lung base. Degenerative changes are present in the thoracic spine. No acute osseous abnormality. IMPRESSION: No active cardiopulmonary disease. Electronically Signed   By: Leita Birmingham M.D.   On: 01/01/2024 16:17      PROCEDURES:  Critical Care performed: No  Procedures   MEDICATIONS ORDERED IN ED: Medications - No data to display   IMPRESSION / MDM / ASSESSMENT AND PLAN / ED COURSE  I reviewed the triage vital signs and the nursing notes.                               Differential diagnosis includes, but is not limited to, ACS, aortic dissection, pulmonary embolism, cardiac tamponade, pneumothorax, pneumonia, pericarditis, myocarditis, GI-related causes including esophagitis/gastritis, and musculoskeletal chest wall pain.    No ripping tearing or moving components that would suggest dissection.  No pleuritic pain no dyspnea.  No significant risk factors for a PE.  D-dimer exclusionary  Patient's presentation is most consistent with acute complicated illness / injury requiring diagnostic workup.  In review, reassuring that the patient had a myocardial perfusion stress test that was interpreted as normal September 2 of this year.    The patient is on the cardiac monitor to evaluate for evidence of arrhythmia and/or significant heart rate changes.   Clinical Course as of 01/02/24 0042  Sat Jan 01, 2024  1843 D-dimer is normal, low risk for PE by pretest probability and negative D-dimer.  Additionally clinical history is not suggestive of dissection, normal D-dimer is somewhat reassuring against this as well though the sensitivity for that test would be low [MQ]    Clinical Course User Index [MQ] Dicky Anes, MD   Patient's workup reassuring.  Plan to discharge with referral to follow-up with cardiology and careful return precautions.  No evidence of ACS.  No signs or symptoms of be suggestive of acute dissection.  D-dimer normal.  Troponin normal x 2.  Clinical features do not suggest high likelihood of ACS.  FINAL CLINICAL IMPRESSION(S) / ED DIAGNOSES   Final diagnoses:  Chest pain with low risk for cardiac etiology     Rx / DC Orders   ED Discharge Orders          Ordered    Ambulatory referral to Cardiology        01/01/24 1850             Note:  This document was prepared using Dragon voice recognition software and may include unintentional dictation errors.   Dicky Anes, MD 01/02/24 [phone]

## 2024-01-01 NOTE — Discharge Instructions (Signed)
 You have been advised to follow up immediately in the emergency department for concerning signs or symptoms as discussed during your visit. Do not delay.   Based on concerns about condition, if you do not follow up in the ED, you may risk poor outcomes including worsening of condition, delayed treatment and potentially life threatening issues. If you have declined to go to the ED at this time, you should call your PCP immediately to set up a follow up appointment.   Go to ED for red flag symptoms, including; fevers you cannot reduce with Tylenol/Motrin, severe headaches, vision changes, numbness/weakness in part of the body, lethargy, confusion, intractable vomiting, severe dehydration, chest pain, breathing difficulty, severe persistent abdominal or pelvic pain, signs of severe infection (increased redness, swelling of an area), feeling faint or passing out, dizziness, etc. You should especially go to the ED for sudden acute worsening of condition if you do not elect to go at this time.

## 2024-01-01 NOTE — ED Triage Notes (Signed)
 Pt c/o chest pain x4 days. Denies any palpitations,sob,numbness or tingling. Hx of CAD. States saw cardio over the summer. Had stress test done which was WNL.

## 2024-01-01 NOTE — ED Triage Notes (Signed)
 Pt to ED via POV from UC. Pt reports left sided CP x4 days that has been constant. Pt denies SOB, N/V/D or cough. Pt sent for further evaluation.

## 2024-01-01 NOTE — ED Provider Notes (Signed)
 [facility]    CSN: [id number] Arrival date & time: 01/01/24  1414      History   Chief Complaint Chief Complaint  Patient presents with   Chest Pain    HPI Joe Dunlap is a 63 y.o. male.   HPI  Joe Dunlap here for constant pressure-like chest pain for 4 days.  Pain rated 1.5 /10 and doesn't radiate.  No known similar pain but says probably. Previously had a heart stress test. Follows with cardiologist, Dr Elodie at [facility].    Stopped taking the omeprazole a few months ago as he rarely gets heart burn.    Patient Denies: Nausea, Vomiting, Diaphoresis, Shortness of breath, Pleuritic pain, Leg swelling, Syncope, Heartburn/food sticking, Recent immobility, Wheezing, Hemoptysis, Trauma, Fever, and Cough  Pt reports history of high cholesterol and high blood pressure.  Denies history of cancer, Pe/ DVT, recent surgery or travel.   Tobacco use: denies      Past Medical History:  Diagnosis Date   Anginal pain    Aortic atherosclerosis    Benign prostatic hyperplasia with urinary obstruction    a.) s/p TURP 06/2023   Bilateral carotid artery disease    Chronic tension type headache    Coronary artery disease    DDD (degenerative disc disease), lumbar    Degeneration of spine    Diverticulosis    GERD (gastroesophageal reflux disease)    Headache    Hepatic steatosis    Hyperlipemia    Hypertension    Impaired glucose tolerance    Kidney stones 2018   Labral tear of hip, degenerative    Lumbar radiculitis    Obesity (BMI 30-39.9)    OSA on CPAP    Periumbilical hernia    Renal cyst, left    Shingles    Shingles    LEFT   Stomach ulcer    Supraumbilical hernia    Ventral hernia     Patient Active Problem List   Diagnosis Date Noted   Chronic tension-type headache, not intractable 03/04/2017   Impaired glucose tolerance 01/09/2014   Shingles 10/19/2013   Right hip pain 09/20/2013   DDD (degenerative disc disease), lumbar 09/07/2013    Labral tear of hip, degenerative 09/07/2013   Lumbar radiculitis 09/07/2013   Elevated fasting blood sugar 08/03/2013   GERD (gastroesophageal reflux disease) 08/03/2013   Hyperlipidemia, unspecified 08/03/2013   Hypertension, essential, benign 08/03/2013   Low back pain 08/03/2013   Abdominal pain 07/31/2013    Past Surgical History:  Procedure Laterality Date   COLONOSCOPY WITH PROPOFOL  N/A 02/11/2018   Procedure: COLONOSCOPY WITH PROPOFOL ;  Surgeon: Gaylyn Gladis PENNER, MD;  Location: [facility] ENDOSCOPY;  Service: Endoscopy;  Laterality: N/A;   CYSTOSCOPY WITH INSERTION OF UROLIFT N/A 08/14/2019   Procedure: CYSTOSCOPY WITH INSERTION OF UROLIFT;  Surgeon: Penne Knee, MD;  Location: [facility] ORS;  Service: Urology;  Laterality: N/A;   CYSTOSCOPY WITH INSERTION OF UROLIFT N/A 09/08/2021   Procedure: CYSTOSCOPY WITH INSERTION OF UROLIFT;  Surgeon: Penne Knee, MD;  Location: [facility] ORS;  Service: Urology;  Laterality: N/A;   CYSTOSCOPY/URETEROSCOPY/HOLMIUM LASER/STENT PLACEMENT Right 08/24/2023   Procedure: CYSTOSCOPY/URETEROSCOPY/HOLMIUM LASER/STENT PLACEMENT;  Surgeon: Twylla Glendia BROCKS, MD;  Location: [facility] ORS;  Service: Urology;  Laterality: Right;   ESOPHAGOGASTRODUODENOSCOPY N/A 11/26/2023   Procedure: EGD (ESOPHAGOGASTRODUODENOSCOPY);  Surgeon: Maryruth Ole DASEN, MD;  Location: [facility] ENDOSCOPY;  Service: Endoscopy;  Laterality: N/A;   EXTRACORPOREAL SHOCK WAVE LITHOTRIPSY Right 06/25/2016   Procedure: EXTRACORPOREAL SHOCK WAVE LITHOTRIPSY (ESWL);  Surgeon: Rosina Riis, MD;  Location: [facility] ORS;  Service: Urology;  Laterality: Right;   EXTRACORPOREAL SHOCK WAVE LITHOTRIPSY Right 08/13/2022   Procedure: EXTRACORPOREAL SHOCK WAVE LITHOTRIPSY (ESWL);  Surgeon: Francisca Redell BROCKS, MD;  Location: [facility] ORS;  Service: Urology;  Laterality: Right;   GASTRIC ULCER REPAIR     KNEE SURGERY Left    LEFT HEART CATH AND CORONARY ANGIOGRAPHY Left 05/11/2018   Procedure: LEFT HEART CATH AND CORONARY  ANGIOGRAPHY;  Surgeon: Ammon Blunt, MD;  Location: [facility] INVASIVE CV LAB;  Service: Cardiovascular;  Laterality: Left;   NASAL SEPTOPLASTY W/ TURBINOPLASTY N/A    TONSILLECTOMY     TRANSURETHRAL RESECTION OF PROSTATE N/A 06/14/2023   Procedure: TURP (TRANSURETHRAL RESECTION OF PROSTATE);  Surgeon: Riis Rosina, MD;  Location: [facility] ORS;  Service: Urology;  Laterality: N/A;   UPPER GI ENDOSCOPY  2008   stomach ulcer   UVULOPALATOPHARYNGOPLASTY (UPPP)/TONSILLECTOMY/SEPTOPLASTY  2000       Home Medications    Prior to Admission medications   Medication Sig Start Date End Date Taking? Authorizing Provider  amLODipine (NORVASC) 10 MG tablet Take 10 mg by mouth at bedtime.    [provider]  Ascorbic Acid (VITAMIN C PO) Take 500 mg by mouth daily.    [provider]  CINNAMON PO Take 1 capsule by mouth daily.    [provider]  Coenzyme Q10 (COQ10 PO) Take 200 mg by mouth daily.    [provider]  CRANBERRY PO Take 1 tablet by mouth daily.    [provider]  HYDROcodone -acetaminophen  (NORCO/VICODIN) 5-325 MG tablet Take 1 tablet by mouth every 6 (six) hours as needed for moderate pain (pain score 4-6). 08/24/23   Stoioff, Glendia BROCKS, MD  isosorbide mononitrate (IMDUR) 30 MG 24 hr tablet Take 30 mg by mouth daily with lunch.    [provider]  Menthol-Methyl Salicylate (SALONPAS PAIN RELIEF PATCH EX) Place 1 patch onto the skin daily as needed (pain.).    [provider]  metoprolol succinate (TOPROL-XL) 25 MG 24 hr tablet Take 12.5 mg by mouth daily with lunch.    [provider]  Multiple Vitamin (MULTIVITAMIN WITH MINERALS) TABS tablet Take 1 tablet by mouth daily.    [provider]  Multiple Vitamins-Minerals (HEALTHY EYES/LUTEIN-ZEAXANTHIN) CAPS Take 1 capsule by mouth daily.    [provider]  omeprazole (PRILOSEC OTC) 20 MG tablet Take 20 mg by mouth daily.    [provider]   oxybutynin  (DITROPAN ) 5 MG tablet 1 tab tid prn frequency,urgency, bladder spasm 08/24/23   Stoioff, Glendia BROCKS, MD  pravastatin (PRAVACHOL) 40 MG tablet Take 40 mg by mouth at bedtime.    [provider]  tamsulosin  (FLOMAX ) 0.4 MG CAPS capsule TAKE 1 CAPSULE(0.4 MG) BY MOUTH DAILY AFTER BREAKFAST 12/22/23   Stoioff, Glendia BROCKS, MD  vitamin B-12 (CYANOCOBALAMIN) 500 MCG tablet Take 500 mcg by mouth daily.    [provider]    Family History Family History  Problem Relation Age of Onset   Heart attack Father    Prostatitis Neg Hx    Prostate cancer Neg Hx    Kidney cancer Neg Hx    Bladder Cancer Neg Hx     Social History Social History   Tobacco Use   Smoking status: Never   Smokeless tobacco: Never  Vaping Use   Vaping status: Never Used  Substance Use Topics   Alcohol use: Not Currently    Alcohol/week: 5.0 standard drinks of  alcohol    Types: 5 Shots of liquor per week   Drug use: No     Allergies   Nsaids   Review of Systems Review of Systems :negative unless otherwise stated in HPI.      Physical Exam Triage Vital Signs ED Triage Vitals  Encounter Vitals Group     BP      Girls Systolic BP Percentile      Girls Diastolic BP Percentile      Boys Systolic BP Percentile      Boys Diastolic BP Percentile      Pulse      Resp      Temp      Temp src      SpO2      Weight      Height      Head Circumference      Peak Flow      Pain Score      Pain Loc      Pain Education      Exclude from Growth Chart    No data found.  Updated Vital Signs BP 138/78 (BP Location: Left Arm)   Pulse 83   Temp 98.1 F (36.7 C) (Oral)   Resp 16   Ht 5' 10 (1.778 m)   Wt 115.7 kg   SpO2 95%   BMI 36.59 kg/m   Visual Acuity Right Eye Distance:   Left Eye Distance:   Bilateral Distance:    Right Eye Near:   Left Eye Near:    Bilateral Near:     Physical Exam  GEN: non-toxic appearing male, in no acute distress  CV: regular rate and  rhythm,  no murmurs, rubs or gallops  appreciated,, no JVP  RESP: no increased work of breathing, clear to ascultation bilaterally ABD: Bowel sounds present. Soft, non-tender, non-distended.  SKIN: warm, dry, no rash on visible skin NEURO: alert, moves all extremities appropriately PSYCH: Normal affect, appropriate speech and behavior   UC Treatments / Results  Labs (all labs ordered are listed, but only abnormal results are displayed) Labs Reviewed - No data to display  EKG  See my EKG interpretation in the MDM section  Radiology No results found.   Procedures Procedures (including critical care time)  Medications Ordered in UC Medications - No data to display  Initial Impression / Assessment and Plan / UC Course  I have reviewed the triage vital signs and the nursing notes.  Pertinent labs & imaging results that were available during my care of the patient were reviewed by me and considered in my medical decision making (see chart for details).       Patient is a 63 y.o. male with history of GERD, HLD, HTN  and pre-diabetes who presents with 4 days of left-sided chest pain.  He is afebrile, normotensive and satting 95% on room air.   Differential diagnosis includes, but is not limited to, ACS, aortic dissection, pulmonary embolism, cardiac tamponade, pneumothorax, pneumonia, pericarditis/myocarditis, GI-related causes including esophagitis/gastritis, and musculoskeletal chest wall pain   Kunio has several risk factors and has mild pressure-like central chest pain. No other symptoms.  EKG personally interpreted by me; NSR without acute ST or T wave changes; normal PR interval, no QT prolongation, unchanged compared to prior EKG from 08/17/23.   Patient wants to make sure everything is okay. Explained to patient that he has several cardiac risk factors. I am unable to clear his chest pain here. Recommended ED evaluation and  he states I don't want to go to the ED.  Explained to  patient what the ED can offer for his chest pain given his multivessel disease. [facility] will go [facility] ED for cardiac [facility].   Discussed MDM, treatment plan and plan for follow-up with patient.    Final Clinical Impressions(s) / UC Diagnoses   Final diagnoses:  Chest pain, unspecified type     Discharge Instructions      You have been advised to follow up immediately in the emergency department for concerning signs or symptoms as discussed during your visit. Do not delay.   Based on concerns about condition, if you do not follow up in the ED, you may risk poor outcomes including worsening of condition, delayed treatment and potentially life threatening issues. If you have declined to go to the ED at this time, you should call your PCP immediately to set up a follow up appointment.   Go to ED for red flag symptoms, including; fevers you cannot reduce with Tylenol /Motrin, severe headaches, vision changes, numbness/weakness in part of the body, lethargy, confusion, intractable vomiting, severe dehydration, chest pain, breathing difficulty, severe persistent abdominal or pelvic pain, signs of severe infection (increased redness, swelling of an area), feeling faint or passing out, dizziness, etc. You should especially go to the ED for sudden acute worsening of condition if you do not elect to go at this time.        ED Prescriptions   None    PDMP not reviewed this encounter.   Kriste Berth, DO 01/01/24 1534

## 2024-01-01 NOTE — ED Notes (Signed)
 Patient is being discharged from the Urgent Care and sent to the [facility] Emergency Department via private vehicle . Per Dr. Kriste, patient is in need of higher level of care due to chest pain. Patient is aware and verbalizes understanding of plan of care.  Vitals:   01/01/24 1429  BP: 138/78  Pulse: 83  Resp: 16  Temp: 98.1 F (36.7 C)  SpO2: 95%

## 2024-01-05 ENCOUNTER — Ambulatory Visit: Admitting: Urology

## 2024-01-17 ENCOUNTER — Ambulatory Visit: Admitting: Urology

## 2024-01-20 ENCOUNTER — Encounter: Payer: Self-pay | Admitting: Ophthalmology

## 2024-01-20 NOTE — Anesthesia Preprocedure Evaluation (Addendum)
 Anesthesia Evaluation  Patient identified by MRN, date of birth, ID band Patient awake    Reviewed: Allergy & Precautions, H&P , NPO status , Patient's Chart, lab work & pertinent test results  Airway Mallampati: IV  TM Distance: <3 FB Neck ROM: Full   Comment: Note history of UPPP, airway may not accurately reflect actual difficulty of intubation if this were needed. Would likely still be anterior airway and would definitely consider video-laryngoscope if intubation were needed. Dental no notable dental hx.    Pulmonary neg pulmonary ROS, sleep apnea , pneumonia   Pulmonary exam normal breath sounds clear to auscultation       Cardiovascular hypertension, + angina  + CAD  negative cardio ROS Normal cardiovascular exam Rhythm:Regular Rate:Normal  07-23-22 echo INTERPRETATION  NORMAL LEFT VENTRICULAR SYSTOLIC FUNCTION  NORMAL RIGHT VENTRICULAR SYSTOLIC FUNCTION  MILD VALVULAR REGURGITATION (See above)  NO VALVULAR STENOSIS  MILD AR  EF >55%    ETT Myoview 07/30/22 revealed normal myocardial perfusion scan with no evidence of stress-induced myocardial ischemia ejection fraction of 63% conclusion negative scan this is a lower study. Carotid US  07/23/22, done due to dizziness, showed mild homogenous plaque bilaterally with no hemodynamically significant stenosis noted. EKG today reveals NSR, HR 72 bpm, no acute ST-T wave changes, no significant change when compared to prior EKG.   Neuro/Psych  Headaches  Neuromuscular disease negative neurological ROS  negative psych ROS   GI/Hepatic negative GI ROS, Neg liver ROS, PUD,GERD  ,,  Endo/Other  negative endocrine ROS    Renal/GU Renal diseasenegative Renal ROS  negative genitourinary   Musculoskeletal negative musculoskeletal ROS (+)    Abdominal   Peds negative pediatric ROS (+)  Hematology negative hematology ROS (+)   Anesthesia Other Findings Hypertension  Stomach  ulcer Hyperlipemia  GERD (gastroesophageal reflux disease) Labral tear of hip, degenerative  DDD (degenerative disc disease), lumbar Lumbar radiculitis  Shingles Impaired glucose tolerance  Headache Obesity (BMI 30-39.9)  Kidney stones Coronary artery disease  OSA on CPAP Benign prostatic hyperplasia with urinary obstruction  Anginal pain Bilateral carotid artery disease  Aortic atherosclerosis Ventral hernia  Hepatic steatosis Renal cyst, left  Diverticulosis Periumbilical hernia  Supraumbilical hernia Degeneration of spine Chronic tension type headache Shingles  Pneumonia History of kidney stones     Reproductive/Obstetrics negative OB ROS                              Anesthesia Physical Anesthesia Plan  ASA: 3  Anesthesia Plan: MAC   Post-op Pain Management:    Induction: Intravenous  PONV Risk Score and Plan:   Airway Management Planned: Natural Airway and Nasal Cannula  Additional Equipment:   Intra-op Plan:   Post-operative Plan:   Informed Consent: I have reviewed the patients History and Physical, chart, labs and discussed the procedure including the risks, benefits and alternatives for the proposed anesthesia with the patient or authorized representative who has indicated his/her understanding and acceptance.     Dental Advisory Given  Plan Discussed with: Anesthesiologist, CRNA and Surgeon  Anesthesia Plan Comments: (Patient consented for risks of anesthesia including but not limited to:  - adverse reactions to medications - damage to eyes, teeth, lips or other oral mucosa - nerve damage due to positioning  - sore throat or hoarseness - Damage to heart, brain, nerves, lungs, other parts of body or loss of life  Patient voiced understanding and assent.)  Anesthesia Quick Evaluation

## 2024-01-20 NOTE — Discharge Instructions (Signed)

## 2024-01-24 ENCOUNTER — Ambulatory Visit
Admission: RE | Admit: 2024-01-24 | Discharge: 2024-01-24 | Disposition: A | Discharge status: Home / Self Care | Attending: Ophthalmology | Admitting: Ophthalmology

## 2024-01-24 ENCOUNTER — Ambulatory Visit: Payer: Self-pay | Admitting: Anesthesiology

## 2024-01-24 ENCOUNTER — Other Ambulatory Visit: Payer: Self-pay

## 2024-01-24 ENCOUNTER — Encounter: Payer: Self-pay | Admitting: Ophthalmology

## 2024-01-24 ENCOUNTER — Encounter
Admission: RE | Disposition: A | Discharge status: Home / Self Care | Payer: Self-pay | Source: Home / Self Care | Attending: Ophthalmology

## 2024-01-24 DIAGNOSIS — H25042 Posterior subcapsular polar age-related cataract, left eye: Secondary | ICD-10-CM | POA: Diagnosis present

## 2024-01-24 DIAGNOSIS — I2510 Atherosclerotic heart disease of native coronary artery without angina pectoris: Secondary | ICD-10-CM | POA: Diagnosis not present

## 2024-01-24 DIAGNOSIS — I10 Essential (primary) hypertension: Secondary | ICD-10-CM | POA: Diagnosis not present

## 2024-01-24 DIAGNOSIS — H2512 Age-related nuclear cataract, left eye: Secondary | ICD-10-CM | POA: Diagnosis present

## 2024-01-24 DIAGNOSIS — K219 Gastro-esophageal reflux disease without esophagitis: Secondary | ICD-10-CM | POA: Insufficient documentation

## 2024-01-24 DIAGNOSIS — G4730 Sleep apnea, unspecified: Secondary | ICD-10-CM | POA: Diagnosis not present

## 2024-01-24 HISTORY — PX: CATARACT EXTRACTION W/PHACO: SHX586

## 2024-01-24 HISTORY — DX: Pneumonia, unspecified organism: J18.9

## 2024-01-24 HISTORY — DX: Nonrheumatic aortic (valve) insufficiency: I35.1

## 2024-01-24 SURGERY — PHACOEMULSIFICATION, CATARACT, WITH IOL INSERTION
Anesthesia: Monitor Anesthesia Care | Site: Eye | Laterality: Left

## 2024-01-24 MED ORDER — MIDAZOLAM HCL (PF) 2 MG/2ML IJ SOLN
INTRAMUSCULAR | Status: DC | PRN
  Administered 2024-01-24: 2 mg via INTRAVENOUS

## 2024-01-24 MED ORDER — PHENYLEPHRINE HCL 10 % OP SOLN
OPHTHALMIC | Status: AC
  Filled 2024-01-24: qty 5

## 2024-01-24 MED ORDER — FENTANYL CITRATE (PF) 100 MCG/2ML IJ SOLN
INTRAMUSCULAR | Status: DC | PRN
  Administered 2024-01-24 (×2): 50 ug via INTRAVENOUS

## 2024-01-24 MED ORDER — FENTANYL CITRATE (PF) 100 MCG/2ML IJ SOLN
INTRAMUSCULAR | Status: AC
  Filled 2024-01-24: qty 2

## 2024-01-24 MED ORDER — TETRACAINE HCL 0.5 % OP SOLN
1.0000 [drp] | OPHTHALMIC | Status: DC | PRN
  Administered 2024-01-24 (×3): 1 [drp] via OPHTHALMIC

## 2024-01-24 MED ORDER — CYCLOPENTOLATE HCL 2 % OP SOLN
1.0000 [drp] | OPHTHALMIC | Status: AC
Start: 2024-01-24 — End: 2024-01-24
  Administered 2024-01-24 (×2): 1 [drp] via OPHTHALMIC

## 2024-01-24 MED ORDER — MIDAZOLAM HCL 2 MG/2ML IJ SOLN
INTRAMUSCULAR | Status: AC
  Filled 2024-01-24: qty 2

## 2024-01-24 MED ORDER — LIDOCAINE HCL (PF) 2 % IJ SOLN
INTRAMUSCULAR | Status: DC | PRN
  Administered 2024-01-24: 4 mL via INTRAOCULAR

## 2024-01-24 MED ORDER — SIGHTPATH DOSE#1 BSS IO SOLN
INTRAOCULAR | Status: DC | PRN
  Administered 2024-01-24: 15 mL via INTRAOCULAR

## 2024-01-24 MED ORDER — CYCLOPENTOLATE HCL 2 % OP SOLN
OPHTHALMIC | Status: AC
  Filled 2024-01-24: qty 2

## 2024-01-24 MED ORDER — SIGHTPATH DOSE#1 BSS IO SOLN
INTRAOCULAR | Status: DC | PRN
  Administered 2024-01-24: 77 mL via OPHTHALMIC

## 2024-01-24 MED ORDER — LACTATED RINGERS IV SOLN: INTRAVENOUS | Status: DC

## 2024-01-24 MED ORDER — PHENYLEPHRINE HCL 10 % OP SOLN
1.0000 [drp] | OPHTHALMIC | Status: AC
  Administered 2024-01-24 (×2): 1 [drp] via OPHTHALMIC

## 2024-01-24 MED ORDER — MOXIFLOXACIN HCL 0.5 % OP SOLN
OPHTHALMIC | Status: DC | PRN
  Administered 2024-01-24: .2 mL via OPHTHALMIC

## 2024-01-24 MED ORDER — SIGHTPATH DOSE#1 NA HYALUR & NA CHOND-NA HYALUR IO KIT
PACK | INTRAOCULAR | Status: DC | PRN
  Administered 2024-01-24: 1 via OPHTHALMIC

## 2024-01-24 MED ORDER — TETRACAINE HCL 0.5 % OP SOLN
OPHTHALMIC | Status: AC
  Filled 2024-01-24: qty 4

## 2024-01-24 SURGICAL SUPPLY — 9 items
DISSECTOR HYDRO NUCLEUS 50X22 (MISCELLANEOUS) ×1 IMPLANT
FEE CATARACT SUITE SIGHTPATH (MISCELLANEOUS) ×1 IMPLANT
GLOVE PI ULTRA LF STRL 7.5 (GLOVE) ×1 IMPLANT
GLOVE SURG SYN 6.5 PF PI BL (GLOVE) ×1 IMPLANT
GLOVE SURG SYN 8.5 PF PI BL (GLOVE) ×1 IMPLANT
LENS IOL TECNIS EYHANCE 15.5 (Intraocular Lens) IMPLANT
NDL FILTER BLUNT 18X1 1/2 (NEEDLE) ×1 IMPLANT
NEEDLE FILTER BLUNT 18X1 1/2 (NEEDLE) ×1 IMPLANT
SYR 3ML LL SCALE MARK (SYRINGE) ×1 IMPLANT

## 2024-01-24 NOTE — Transfer of Care (Signed)
 Immediate Anesthesia Transfer of Care Note  Patient: Joe Dunlap  Procedure(s) Performed: PHACOEMULSIFICATION, CATARACT, WITH IOL INSERTION 12.74 01:04.5 (Left: Eye)  Patient Location: PACU  Anesthesia Type: MAC  Level of Consciousness: awake, alert  and patient cooperative  Airway and Oxygen Therapy: Patient Spontanous Breathing and Patient connected to supplemental oxygen  Post-op Assessment: Post-op Vital signs reviewed, Patient's Cardiovascular Status Stable, Respiratory Function Stable, Patent Airway and No signs of Nausea or vomiting  Post-op Vital Signs: Reviewed and stable  Complications: No notable events documented.

## 2024-01-24 NOTE — Op Note (Signed)
 OPERATIVE NOTE  Joe Dunlap [id number] 01/24/2024   PREOPERATIVE DIAGNOSIS:  Nuclear sclerotic cataract left eye.  H25.12   POSTOPERATIVE DIAGNOSIS:    Nuclear sclerotic cataract left eye.     PROCEDURE:  Phacoemusification with posterior chamber intraocular lens placement of the left eye   LENS:   Implant Name Type Inv. Item Serial No. Manufacturer Lot No. LRB No. Used Action  LENS IOL TECNIS EYHANCE 15.5 - [id number] Intraocular Lens LENS IOL TECNIS EYHANCE 15.5 [id number] [employer]  Left 1 Implanted      Procedure(s): PHACOEMULSIFICATION, CATARACT, WITH IOL INSERTION 12.74 01:04.5 (Left)  SURGEON:  Adine Novak, MD, MPH   ANESTHESIA:  Topical with tetracaine  drops augmented with 1% preservative-free intracameral lidocaine .  ESTIMATED BLOOD LOSS: <1 mL   COMPLICATIONS:  None.   DESCRIPTION OF PROCEDURE:  The patient was identified in the holding room and transported to the operating room and placed in the supine position under the operating microscope.  The left eye was identified as the operative eye and it was prepped and draped in the usual sterile ophthalmic fashion.   A 1.0 millimeter clear-corneal paracentesis was made at the 5:00 position. 0.5 ml of preservative-free 1% lidocaine  with epinephrine  was injected into the anterior chamber.  The anterior chamber was filled with viscoelastic.  A 2.4 millimeter keratome was used to make a near-clear corneal incision at the 2:00 position.  A curvilinear capsulorrhexis was made with a cystotome and capsulorrhexis forceps.  Balanced salt solution was used to hydrodissect and hydrodelineate the nucleus.   Phacoemulsification was then used in stop and chop fashion to remove the lens nucleus and epinucleus.  The remaining cortex was then removed using the irrigation and aspiration handpiece. Viscoelastic was then placed into the capsular bag to distend it for lens placement.  A lens was then injected into the capsular bag.  The  remaining viscoelastic was aspirated.   Wounds were hydrated with balanced salt solution.  The anterior chamber was inflated to a physiologic pressure with balanced salt solution.  Intracameral vigamox  0.1 mL undiltued was injected into the eye and a drop placed onto the ocular surface.  No wound leaks were noted.  The patient was taken to the recovery room in stable condition without complications of anesthesia or surgery  Adine Novak 01/24/2024, 9:17 AM

## 2024-01-24 NOTE — H&P (Signed)
 [facility]   Primary Care Physician:  Jeffie Cheryl BRAVO, MD Ophthalmologist: Dr. Adine Novak  Pre-Procedure History & Physical: HPI:  [name] is a 63 y.o. male here for cataract surgery.   Past Medical History:  Diagnosis Date   Anginal pain    Aortic atherosclerosis    Benign prostatic hyperplasia with urinary obstruction    a.) s/p TURP 06/2023   Bilateral carotid artery disease    Chronic tension type headache    Coronary artery disease    DDD (degenerative disc disease), lumbar    Degeneration of spine    Diverticulosis    GERD (gastroesophageal reflux disease)    IN THE PAST--TAKES NO MEDICATIONS   Headache    Hepatic steatosis    History of kidney stones    Hyperlipemia    Hypertension    Impaired glucose tolerance    Kidney stones 2018   Labral tear of hip, degenerative    Lumbar radiculitis    Mild aortic valve regurgitation    Obesity (BMI 30-39.9)    OSA on CPAP    Periumbilical hernia    Pneumonia    1997   Renal cyst, left    S/P UPPP (uvulopalatopharyngoplasty) 2000   also had tonsillectomy and septoplasty   Shingles    Shingles    LEFT   Stomach ulcer    Supraumbilical hernia    Ventral hernia     Past Surgical History:  Procedure Laterality Date   COLONOSCOPY WITH PROPOFOL  N/A 02/11/2018   Procedure: COLONOSCOPY WITH PROPOFOL ;  Surgeon: Gaylyn Gladis PENNER, MD;  Location: [facility] ENDOSCOPY;  Service: Endoscopy;  Laterality: N/A;   CYSTOSCOPY WITH INSERTION OF UROLIFT N/A 08/14/2019   Procedure: CYSTOSCOPY WITH INSERTION OF UROLIFT;  Surgeon: Penne Knee, MD;  Location: [facility] ORS;  Service: Urology;  Laterality: N/A;   CYSTOSCOPY WITH INSERTION OF UROLIFT N/A 09/08/2021   Procedure: CYSTOSCOPY WITH INSERTION OF UROLIFT;  Surgeon: Penne Knee, MD;  Location: [facility] ORS;  Service: Urology;  Laterality: N/A;   CYSTOSCOPY/URETEROSCOPY/HOLMIUM LASER/STENT PLACEMENT Right 08/24/2023   Procedure: CYSTOSCOPY/URETEROSCOPY/HOLMIUM  LASER/STENT PLACEMENT;  Surgeon: Twylla Glendia BROCKS, MD;  Location: [facility] ORS;  Service: Urology;  Laterality: Right;   ESOPHAGOGASTRODUODENOSCOPY N/A 11/26/2023   Procedure: EGD (ESOPHAGOGASTRODUODENOSCOPY);  Surgeon: Maryruth Ole DASEN, MD;  Location: [facility] ENDOSCOPY;  Service: Endoscopy;  Laterality: N/A;   EXTRACORPOREAL SHOCK WAVE LITHOTRIPSY Right 06/25/2016   Procedure: EXTRACORPOREAL SHOCK WAVE LITHOTRIPSY (ESWL);  Surgeon: Knee Penne, MD;  Location: [facility] ORS;  Service: Urology;  Laterality: Right;   EXTRACORPOREAL SHOCK WAVE LITHOTRIPSY Right 08/13/2022   Procedure: EXTRACORPOREAL SHOCK WAVE LITHOTRIPSY (ESWL);  Surgeon: Francisca Redell BROCKS, MD;  Location: [facility] ORS;  Service: Urology;  Laterality: Right;   GASTRIC ULCER REPAIR     KNEE SURGERY Left    LEFT HEART CATH AND CORONARY ANGIOGRAPHY Left 05/11/2018   Procedure: LEFT HEART CATH AND CORONARY ANGIOGRAPHY;  Surgeon: Ammon Blunt, MD;  Location: [facility] INVASIVE CV LAB;  Service: Cardiovascular;  Laterality: Left;   NASAL SEPTOPLASTY W/ TURBINOPLASTY N/A    TONSILLECTOMY     TRANSURETHRAL RESECTION OF PROSTATE N/A 06/14/2023   Procedure: TURP (TRANSURETHRAL RESECTION OF PROSTATE);  Surgeon: Penne Knee, MD;  Location: [facility] ORS;  Service: Urology;  Laterality: N/A;   UPPER GI ENDOSCOPY  2008   stomach ulcer   UVULOPALATOPHARYNGOPLASTY (UPPP)/TONSILLECTOMY/SEPTOPLASTY  2000    Prior to Admission medications   Medication Sig Start Date End Date Taking? Authorizing Provider  amLODipine (NORVASC) 10 MG  tablet Take 10 mg by mouth at bedtime.   Yes [provider]  ascorbic acid (VITAMIN C) 500 MG tablet Take 500 mg by mouth daily.   Yes [provider]  Coenzyme Q10 (COQ10 PO) Take 200 mg by mouth daily.   Yes [provider]  CRANBERRY PO Take 1 tablet by mouth daily.   Yes [provider]  isosorbide mononitrate (IMDUR) 30 MG 24 hr tablet Take 30 mg by mouth daily with lunch.   Yes  [provider]  Multiple Vitamin (MULTIVITAMIN WITH MINERALS) TABS tablet Take 1 tablet by mouth daily.   Yes [provider]  Multiple Vitamins-Minerals (HEALTHY EYES/LUTEIN-ZEAXANTHIN) CAPS Take 1 capsule by mouth daily.   Yes [provider]  pravastatin (PRAVACHOL) 40 MG tablet Take 40 mg by mouth at bedtime. Patient taking differently: Take 80 mg by mouth at bedtime.   Yes [provider]  CINNAMON PO Take 1 capsule by mouth daily.    [provider]  metoprolol succinate (TOPROL-XL) 25 MG 24 hr tablet Take 12.5 mg by mouth daily with lunch. Patient not taking: Reported on 01/24/2024    [provider]  vitamin B-12 (CYANOCOBALAMIN) 500 MCG tablet Take 500 mcg by mouth daily.    [provider]    Allergies as of 12/28/2023 - Review Complete 11/26/2023  Allergen Reaction Noted   Nsaids Other (See Comments) 08/03/2013    Family History  Problem Relation Age of Onset   Heart attack Father    Prostatitis Neg Hx    Prostate cancer Neg Hx    Kidney cancer Neg Hx    Bladder Cancer Neg Hx     Social History   Socioeconomic History   Marital status: Single    Spouse name: Not on file   Number of children: Not on file   Years of education: Not on file   Highest education level: Not on file  Occupational History   Not on file  Tobacco Use   Smoking status: Never   Smokeless tobacco: Never  Vaping Use   Vaping status: Never Used  Substance and Sexual Activity   Alcohol use: Not Currently    Alcohol/week: 5.0 standard drinks of alcohol    Types: 5 Shots of liquor per week   Drug use: Yes    Types: Marijuana    Comment: AS TEENAGER   Sexual activity: Yes  Other Topics Concern   Not on file  Social History Narrative   Lives alone   Social Drivers of Health   Financial Resource Strain: Not on file  Food Insecurity: Not on file  Transportation Needs: Not on file  Physical Activity: Not on file  Stress: Not  on file  Social Connections: Not on file  Intimate Partner Violence: Not on file    Review of Systems: See HPI, otherwise negative ROS  Physical Exam: BP (!) 148/72   Pulse 76   Temp 98.4 F (36.9 C) (Temporal)   Resp 18   Ht 5' 10 (1.778 m)   Wt 112.8 kg   SpO2 93%   BMI 35.68 kg/m  General:   Alert, cooperative. Head:  Normocephalic and atraumatic. Respiratory:  Normal work of breathing. Cardiovascular:  NAD  Impression/Plan: Joe Dunlap is here for cataract surgery.  Risks, benefits, limitations, and alternatives regarding cataract surgery have been reviewed with the patient.  Questions have been answered.  All parties agreeable.   Adine Novak, MD  01/24/2024, 8:50 AM

## 2024-01-24 NOTE — Anesthesia Postprocedure Evaluation (Signed)
 Anesthesia Post Note  Patient: Joe Dunlap  Procedure(s) Performed: PHACOEMULSIFICATION, CATARACT, WITH IOL INSERTION 12.74 01:04.5 (Left: Eye)  Patient location during evaluation: PACU Anesthesia Type: MAC Level of consciousness: awake and alert Pain management: pain level controlled Vital Signs Assessment: post-procedure vital signs reviewed and stable Respiratory status: spontaneous breathing, nonlabored ventilation, respiratory function stable and patient connected to nasal cannula oxygen Cardiovascular status: stable and blood pressure returned to baseline Postop Assessment: no apparent nausea or vomiting Anesthetic complications: no   No notable events documented.   Last Vitals:  Vitals:   01/24/24 0918 01/24/24 0922  BP: 130/64 128/67  Pulse: 64 62  Resp: 13 15  Temp: 36.6 C   SpO2: 97% 97%    Last Pain:  Vitals:   01/24/24 0922  TempSrc:   PainSc: 0-No pain                 Mang Hazelrigg C Jaleia Hanke

## 2024-02-01 NOTE — Discharge Instructions (Signed)

## 2024-02-07 ENCOUNTER — Encounter
Admission: RE | Disposition: A | Discharge status: Home / Self Care | Payer: Self-pay | Source: Home / Self Care | Attending: Ophthalmology

## 2024-02-07 ENCOUNTER — Ambulatory Visit: Payer: Self-pay | Admitting: Anesthesiology

## 2024-02-07 ENCOUNTER — Ambulatory Visit
Admission: RE | Admit: 2024-02-07 | Discharge: 2024-02-07 | Disposition: A | Attending: Ophthalmology | Admitting: Ophthalmology

## 2024-02-07 ENCOUNTER — Other Ambulatory Visit: Payer: Self-pay

## 2024-02-07 ENCOUNTER — Encounter: Payer: Self-pay | Admitting: Ophthalmology

## 2024-02-07 HISTORY — PX: CATARACT EXTRACTION W/PHACO: SHX586

## 2024-02-07 SURGERY — PHACOEMULSIFICATION, CATARACT, WITH IOL INSERTION
Anesthesia: Topical | Site: Eye | Laterality: Right

## 2024-02-07 MED ORDER — MIDAZOLAM HCL 2 MG/2ML IJ SOLN
INTRAMUSCULAR | Status: AC
  Filled 2024-02-07: qty 2

## 2024-02-07 MED ORDER — SIGHTPATH DOSE#1 NA HYALUR & NA CHOND-NA HYALUR IO KIT
PACK | INTRAOCULAR | Status: DC | PRN
  Administered 2024-02-07: 1 via OPHTHALMIC

## 2024-02-07 MED ORDER — LACTATED RINGERS IV SOLN: INTRAVENOUS | Status: DC

## 2024-02-07 MED ORDER — PHENYLEPHRINE HCL 10 % OP SOLN
OPHTHALMIC | Status: AC
  Filled 2024-02-07: qty 5

## 2024-02-07 MED ORDER — SIGHTPATH DOSE#1 BSS IO SOLN
INTRAOCULAR | Status: DC | PRN
  Administered 2024-02-07: 100 mL via OPHTHALMIC

## 2024-02-07 MED ORDER — TETRACAINE HCL 0.5 % OP SOLN
OPHTHALMIC | Status: AC
  Filled 2024-02-07: qty 4

## 2024-02-07 MED ORDER — TETRACAINE HCL 0.5 % OP SOLN
1.0000 [drp] | OPHTHALMIC | Status: DC | PRN
  Administered 2024-02-07 (×3): 1 [drp] via OPHTHALMIC

## 2024-02-07 MED ORDER — FENTANYL CITRATE (PF) 100 MCG/2ML IJ SOLN
INTRAMUSCULAR | Status: DC | PRN
  Administered 2024-02-07 (×2): 50 ug via INTRAVENOUS

## 2024-02-07 MED ORDER — CYCLOPENTOLATE HCL 2 % OP SOLN
OPHTHALMIC | Status: AC
  Filled 2024-02-07: qty 2

## 2024-02-07 MED ORDER — CYCLOPENTOLATE HCL 2 % OP SOLN
1.0000 [drp] | OPHTHALMIC | Status: AC
  Administered 2024-02-07 (×2): 1 [drp] via OPHTHALMIC

## 2024-02-07 MED ORDER — SIGHTPATH DOSE#1 BSS IO SOLN
INTRAOCULAR | Status: DC | PRN
  Administered 2024-02-07: 15 mL via INTRAOCULAR

## 2024-02-07 MED ORDER — MIDAZOLAM HCL (PF) 2 MG/2ML IJ SOLN
INTRAMUSCULAR | Status: DC | PRN
  Administered 2024-02-07: 2 mg via INTRAVENOUS

## 2024-02-07 MED ORDER — LIDOCAINE HCL (PF) 2 % IJ SOLN
INTRAOCULAR | Status: DC | PRN
  Administered 2024-02-07: 4 mL via INTRAOCULAR

## 2024-02-07 MED ORDER — MOXIFLOXACIN HCL 0.5 % OP SOLN
OPHTHALMIC | Status: DC | PRN
  Administered 2024-02-07: .2 mL via OPHTHALMIC

## 2024-02-07 MED ORDER — PHENYLEPHRINE HCL 10 % OP SOLN
1.0000 [drp] | OPHTHALMIC | Status: AC
  Administered 2024-02-07 (×2): 1 [drp] via OPHTHALMIC

## 2024-02-07 MED ORDER — FENTANYL CITRATE (PF) 100 MCG/2ML IJ SOLN
INTRAMUSCULAR | Status: AC
  Filled 2024-02-07: qty 2

## 2024-02-07 SURGICAL SUPPLY — 8 items
DISSECTOR HYDRO NUCLEUS 50X22 (MISCELLANEOUS) ×1 IMPLANT
FEE CATARACT SUITE SIGHTPATH (MISCELLANEOUS) ×1 IMPLANT
GLOVE PI ULTRA LF STRL 7.5 (GLOVE) ×1 IMPLANT
GLOVE SURG SYN 6.5 PF PI BL (GLOVE) ×1 IMPLANT
GLOVE SURG SYN 8.5 PF PI BL (GLOVE) ×1 IMPLANT
LENS IOL TECNIS EYHANCE 18.0 (Intraocular Lens) IMPLANT
NDL FILTER BLUNT 18X1 1/2 (NEEDLE) ×1 IMPLANT
SYR 3ML LL SCALE MARK (SYRINGE) ×1 IMPLANT

## 2024-02-07 NOTE — Anesthesia Preprocedure Evaluation (Signed)
 Anesthesia Evaluation  Patient identified by MRN, date of birth, ID band Patient awake    Reviewed: Allergy & Precautions, H&P , NPO status , Patient's Chart, lab work & pertinent test results  Airway Mallampati: IV  TM Distance: <3 FB Neck ROM: Full   Comment: Note history of UPPP, airway may not accurately reflect actual difficulty of intubation if this were needed. Would likely still be anterior airway and would definitely consider video-laryngoscope if intubation were needed. Dental no notable dental hx.    Pulmonary neg pulmonary ROS, sleep apnea , pneumonia   Pulmonary exam normal breath sounds clear to auscultation       Cardiovascular hypertension, + angina  + CAD  negative cardio ROS Normal cardiovascular exam Rhythm:Regular Rate:Normal  07-23-22 echo INTERPRETATION  NORMAL LEFT VENTRICULAR SYSTOLIC FUNCTION  NORMAL RIGHT VENTRICULAR SYSTOLIC FUNCTION  MILD VALVULAR REGURGITATION (See above)  NO VALVULAR STENOSIS  MILD AR  EF >55%    ETT Myoview 07/30/22 revealed normal myocardial perfusion scan with no evidence of stress-induced myocardial ischemia ejection fraction of 63% conclusion negative scan this is a lower study. Carotid US  07/23/22, done due to dizziness, showed mild homogenous plaque bilaterally with no hemodynamically significant stenosis noted. EKG today reveals NSR, HR 72 bpm, no acute ST-T wave changes, no significant change when compared to prior EKG.   Neuro/Psych  Headaches  Neuromuscular disease negative neurological ROS  negative psych ROS   GI/Hepatic negative GI ROS, Neg liver ROS, PUD,GERD  ,,  Endo/Other  negative endocrine ROS    Renal/GU Renal diseasenegative Renal ROS  negative genitourinary   Musculoskeletal negative musculoskeletal ROS (+)    Abdominal   Peds negative pediatric ROS (+)  Hematology negative hematology ROS (+)   Anesthesia Other Findings Hypertension  Stomach  ulcer Hyperlipemia  GERD (gastroesophageal reflux disease) Labral tear of hip, degenerative  DDD (degenerative disc disease), lumbar Lumbar radiculitis  Shingles Impaired glucose tolerance  Headache Obesity (BMI 30-39.9)  Kidney stones Coronary artery disease  OSA on CPAP Benign prostatic hyperplasia with urinary obstruction  Anginal pain Bilateral carotid artery disease  Aortic atherosclerosis Ventral hernia  Hepatic steatosis Renal cyst, left  Diverticulosis Periumbilical hernia  Supraumbilical hernia Degeneration of spine Chronic tension type headache Shingles  Pneumonia History of kidney stones     Reproductive/Obstetrics negative OB ROS                              Anesthesia Physical Anesthesia Plan  ASA: 3  Anesthesia Plan: MAC   Post-op Pain Management:    Induction: Intravenous  PONV Risk Score and Plan:   Airway Management Planned: Natural Airway and Nasal Cannula  Additional Equipment:   Intra-op Plan:   Post-operative Plan:   Informed Consent: I have reviewed the patients History and Physical, chart, labs and discussed the procedure including the risks, benefits and alternatives for the proposed anesthesia with the patient or authorized representative who has indicated his/her understanding and acceptance.     Dental Advisory Given  Plan Discussed with: Anesthesiologist, CRNA and Surgeon  Anesthesia Plan Comments: (Patient consented for risks of anesthesia including but not limited to:  - adverse reactions to medications - damage to eyes, teeth, lips or other oral mucosa - nerve damage due to positioning  - sore throat or hoarseness - Damage to heart, brain, nerves, lungs, other parts of body or loss of life  Patient voiced understanding and assent.)  Anesthesia Quick Evaluation

## 2024-02-07 NOTE — Transfer of Care (Signed)
 Immediate Anesthesia Transfer of Care Note  Patient: Joe Dunlap  Procedure(s) Performed: PHACOEMULSIFICATION, CATARACT, WITH IOL INSERTION 3.50 00:34.1 (Right: Eye)  Patient Location: PACU  Anesthesia Type: MAC  Level of Consciousness: awake, alert  and patient cooperative  Airway and Oxygen Therapy: Patient Spontanous Breathing   Post-op Assessment: Post-op Vital signs reviewed, Patient's Cardiovascular Status Stable, Respiratory Function Stable, Patent Airway and No signs of Nausea or vomiting  Post-op Vital Signs: Reviewed and stable  Complications: No notable events documented.

## 2024-02-07 NOTE — Anesthesia Postprocedure Evaluation (Signed)
 Anesthesia Post Note  Patient: Joe Dunlap  Procedure(s) Performed: PHACOEMULSIFICATION, CATARACT, WITH IOL INSERTION 3.50 00:34.1 (Right: Eye)  Patient location during evaluation: PACU Anesthesia Type: MAC Level of consciousness: awake and alert Pain management: pain level controlled Vital Signs Assessment: post-procedure vital signs reviewed and stable Respiratory status: spontaneous breathing, nonlabored ventilation, respiratory function stable and patient connected to nasal cannula oxygen Cardiovascular status: stable and blood pressure returned to baseline Postop Assessment: no apparent nausea or vomiting Anesthetic complications: no   No notable events documented.   Last Vitals:  Vitals:   02/07/24 0951 02/07/24 0955  BP: 120/67 129/76  Pulse: 76 76  Resp: 12 15  Temp: (!) 36.2 C (!) 36.2 C  SpO2: 96% 97%    Last Pain:  Vitals:   02/07/24 0955  TempSrc:   PainSc: 0-No pain                 Prentice Murphy

## 2024-02-07 NOTE — Op Note (Signed)
 OPERATIVE NOTE  Joe Dunlap [id number] 02/07/2024   PREOPERATIVE DIAGNOSIS:  Nuclear sclerotic cataract right eye.  H25.11   POSTOPERATIVE DIAGNOSIS:    Nuclear sclerotic cataract right eye.     PROCEDURE:  Phacoemusification with posterior chamber intraocular lens placement of the right eye   LENS:   Implant Name Type Inv. Item Serial No. Manufacturer Lot No. LRB No. Used Action  LENS IOL TECNIS EYHANCE 18.0 - [id number] Intraocular Lens LENS IOL TECNIS EYHANCE 18.0 [id number] [employer]  Right 1 Implanted       Procedure(s): PHACOEMULSIFICATION, CATARACT, WITH IOL INSERTION 3.50 00:34.1 (Right)  SURGEON:  Adine Novak, MD, MPH  ANESTHESIOLOGIST: Anesthesiologist: Dario Barter, MD CRNA: Veronica Alm BROCKS, CRNA   ANESTHESIA:  Topical with tetracaine  drops augmented with 1% preservative-free intracameral lidocaine .  ESTIMATED BLOOD LOSS: less than 1 mL.   COMPLICATIONS:  None.   DESCRIPTION OF PROCEDURE:  The patient was identified in the holding room and transported to the operating room and placed in the supine position under the operating microscope.  The right eye was identified as the operative eye and it was prepped and draped in the usual sterile ophthalmic fashion.   A 1.0 millimeter clear-corneal paracentesis was made at the 10:30 position. 0.5 ml of preservative-free 1% lidocaine  with epinephrine  was injected into the anterior chamber.  The anterior chamber was filled with viscoelastic.  A 2.4 millimeter keratome was used to make a near-clear corneal incision at the 8:00 position.  A curvilinear capsulorrhexis was made with a cystotome and capsulorrhexis forceps.  Balanced salt solution was used to hydrodissect and hydrodelineate the nucleus.   Phacoemulsification was then used in stop and chop fashion to remove the lens nucleus and epinucleus.  The remaining cortex was then removed using the irrigation and aspiration handpiece. Viscoelastic was then placed into the  capsular bag to distend it for lens placement.  A lens was then injected into the capsular bag.  The remaining viscoelastic was aspirated.   Wounds were hydrated with balanced salt solution.  The anterior chamber was inflated to a physiologic pressure with balanced salt solution.   Intracameral vigamox  0.1 mL undiluted was injected into the eye and a drop placed onto the ocular surface.  No wound leaks were noted.  The patient was taken to the recovery room in stable condition without complications of anesthesia or surgery  Adine Novak 02/07/2024, 9:49 AM

## 2024-02-07 NOTE — H&P (Signed)
 [facility]   Primary Care Physician:  Jeffie Cheryl BRAVO, MD Ophthalmologist: Dr. Adine Novak  Pre-Procedure History & Physical: HPI:  Joe Dunlap is a 63 y.o. male here for cataract surgery.   Past Medical History:  Diagnosis Date   Anginal pain    Aortic atherosclerosis    Benign prostatic hyperplasia with urinary obstruction    a.) s/p TURP 06/2023   Bilateral carotid artery disease    Chronic tension type headache    Coronary artery disease    DDD (degenerative disc disease), lumbar    Degeneration of spine    Diverticulosis    GERD (gastroesophageal reflux disease)    IN THE PAST--TAKES NO MEDICATIONS   Headache    Hepatic steatosis    History of kidney stones    Hyperlipemia    Hypertension    Impaired glucose tolerance    Kidney stones 2018   Labral tear of hip, degenerative    Lumbar radiculitis    Mild aortic valve regurgitation    Obesity (BMI 30-39.9)    OSA on CPAP    Periumbilical hernia    Pneumonia    1997   Renal cyst, left    S/P UPPP (uvulopalatopharyngoplasty) 2000   also had tonsillectomy and septoplasty   Shingles    Shingles    LEFT   Stomach ulcer    Supraumbilical hernia    Ventral hernia     Past Surgical History:  Procedure Laterality Date   CATARACT EXTRACTION W/PHACO Left 01/24/2024   Procedure: PHACOEMULSIFICATION, CATARACT, WITH IOL INSERTION 12.74 01:04.5;  Surgeon: Novak Adine Anes, MD;  Location: [facility];  Service: Ophthalmology;  Laterality: Left;   COLONOSCOPY WITH PROPOFOL  N/A 02/11/2018   Procedure: COLONOSCOPY WITH PROPOFOL ;  Surgeon: Gaylyn Gladis PENNER, MD;  Location: [facility] ENDOSCOPY;  Service: Endoscopy;  Laterality: N/A;   CYSTOSCOPY WITH INSERTION OF UROLIFT N/A 08/14/2019   Procedure: CYSTOSCOPY WITH INSERTION OF UROLIFT;  Surgeon: Penne Knee, MD;  Location: [facility] ORS;  Service: Urology;  Laterality: N/A;   CYSTOSCOPY WITH INSERTION OF UROLIFT N/A 09/08/2021   Procedure: CYSTOSCOPY  WITH INSERTION OF UROLIFT;  Surgeon: Penne Knee, MD;  Location: [facility] ORS;  Service: Urology;  Laterality: N/A;   CYSTOSCOPY/URETEROSCOPY/HOLMIUM LASER/STENT PLACEMENT Right 08/24/2023   Procedure: CYSTOSCOPY/URETEROSCOPY/HOLMIUM LASER/STENT PLACEMENT;  Surgeon: Twylla Glendia BROCKS, MD;  Location: [facility] ORS;  Service: Urology;  Laterality: Right;   ESOPHAGOGASTRODUODENOSCOPY N/A 11/26/2023   Procedure: EGD (ESOPHAGOGASTRODUODENOSCOPY);  Surgeon: Maryruth Ole DASEN, MD;  Location: [facility] ENDOSCOPY;  Service: Endoscopy;  Laterality: N/A;   EXTRACORPOREAL SHOCK WAVE LITHOTRIPSY Right 06/25/2016   Procedure: EXTRACORPOREAL SHOCK WAVE LITHOTRIPSY (ESWL);  Surgeon: Knee Penne, MD;  Location: [facility] ORS;  Service: Urology;  Laterality: Right;   EXTRACORPOREAL SHOCK WAVE LITHOTRIPSY Right 08/13/2022   Procedure: EXTRACORPOREAL SHOCK WAVE LITHOTRIPSY (ESWL);  Surgeon: Francisca Redell BROCKS, MD;  Location: [facility] ORS;  Service: Urology;  Laterality: Right;   GASTRIC ULCER REPAIR     KNEE SURGERY Left    LEFT HEART CATH AND CORONARY ANGIOGRAPHY Left 05/11/2018   Procedure: LEFT HEART CATH AND CORONARY ANGIOGRAPHY;  Surgeon: Ammon Blunt, MD;  Location: [facility] INVASIVE CV LAB;  Service: Cardiovascular;  Laterality: Left;   NASAL SEPTOPLASTY W/ TURBINOPLASTY N/A    TONSILLECTOMY     TRANSURETHRAL RESECTION OF PROSTATE N/A 06/14/2023   Procedure: TURP (TRANSURETHRAL RESECTION OF PROSTATE);  Surgeon: Penne Knee, MD;  Location: [facility] ORS;  Service: Urology;  Laterality: N/A;   UPPER GI ENDOSCOPY  2008   stomach ulcer   UVULOPALATOPHARYNGOPLASTY (UPPP)/TONSILLECTOMY/SEPTOPLASTY  2000    Prior to Admission medications   Medication Sig Start Date End Date Taking? Authorizing Provider  amLODipine (NORVASC) 10 MG tablet Take 10 mg by mouth at bedtime.   Yes [provider]  ascorbic acid (VITAMIN C) 500 MG tablet Take 500 mg by mouth daily.   Yes [provider]  CINNAMON PO Take 1 capsule  by mouth daily.   Yes [provider]  Coenzyme Q10 (COQ10 PO) Take 200 mg by mouth daily.   Yes [provider]  CRANBERRY PO Take 1 tablet by mouth daily.   Yes [provider]  isosorbide mononitrate (IMDUR) 30 MG 24 hr tablet Take 30 mg by mouth daily with lunch.   Yes [provider]  metoprolol succinate (TOPROL-XL) 25 MG 24 hr tablet Take 12.5 mg by mouth daily with lunch.   Yes [provider]  Multiple Vitamin (MULTIVITAMIN WITH MINERALS) TABS tablet Take 1 tablet by mouth daily.   Yes [provider]  pravastatin (PRAVACHOL) 40 MG tablet Take 40 mg by mouth at bedtime. Patient taking differently: Take 80 mg by mouth at bedtime.   Yes [provider]  vitamin B-12 (CYANOCOBALAMIN) 500 MCG tablet Take 500 mcg by mouth daily.   Yes [provider]  Multiple Vitamins-Minerals (HEALTHY EYES/LUTEIN-ZEAXANTHIN) CAPS Take 1 capsule by mouth daily.    [provider]    Allergies as of 12/28/2023 - Review Complete 11/26/2023  Allergen Reaction Noted   Nsaids Other (See Comments) 08/03/2013    Family History  Problem Relation Age of Onset   Heart attack Father    Prostatitis Neg Hx    Prostate cancer Neg Hx    Kidney cancer Neg Hx    Bladder Cancer Neg Hx     Social History   Socioeconomic History   Marital status: Single    Spouse name: Not on file   Number of children: Not on file   Years of education: Not on file   Highest education level: Not on file  Occupational History   Not on file  Tobacco Use   Smoking status: Never   Smokeless tobacco: Never  Vaping Use   Vaping status: Never Used  Substance and Sexual Activity   Alcohol use: Not Currently    Alcohol/week: 5.0 standard drinks of alcohol    Types: 5 Shots of liquor per week   Drug use: Yes    Types: Marijuana    Comment: AS TEENAGER   Sexual activity: Yes  Other Topics Concern   Not on file  Social History Narrative   Lives  alone   Social Drivers of Health   Financial Resource Strain: Not on file  Food Insecurity: Not on file  Transportation Needs: Not on file  Physical Activity: Not on file  Stress: Not on file  Social Connections: Not on file  Intimate Partner Violence: Not on file    Review of Systems: See HPI, otherwise negative ROS  Physical Exam: BP (!) 142/78   Pulse 74   Temp 98.2 F (36.8 C) (Temporal)   Resp 18   Ht 5' 10 (1.778 m)   Wt 114.3 kg   SpO2 96%   BMI 36.16 kg/m  General:   Alert, cooperative. Head:  Normocephalic and atraumatic. Respiratory:  Normal work of breathing. Cardiovascular:  NAD  Impression/Plan: Joe Dunlap is here for cataract surgery.  Risks, benefits, limitations, and alternatives regarding  cataract surgery have been reviewed with the patient.  Questions have been answered.  All parties agreeable.   Adine Novak, MD  02/07/2024, 9:22 AM
